# Patient Record
Sex: Female | Born: 1990 | Race: White | Hispanic: No | Marital: Married | Smoking: Former smoker
Health system: Southern US, Community
[De-identification: ages and names within clinical notes are randomized; demographics above are authoritative.]

## PROBLEM LIST (undated history)

## (undated) DIAGNOSIS — O24419 Gestational diabetes mellitus in pregnancy, unspecified control: Secondary | ICD-10-CM

## (undated) DIAGNOSIS — J45909 Unspecified asthma, uncomplicated: Secondary | ICD-10-CM

## (undated) HISTORY — DX: Unspecified asthma, uncomplicated: J45.909

## (undated) HISTORY — PX: BREAST LUMPECTOMY: SHX2

## (undated) HISTORY — PX: OTHER SURGICAL HISTORY: SHX169

---

## 2012-04-09 HISTORY — PX: BREAST LUMPECTOMY: SHX2

## 2018-08-08 ENCOUNTER — Encounter (HOSPITAL_COMMUNITY): Payer: Self-pay | Admitting: *Deleted

## 2018-08-08 ENCOUNTER — Emergency Department (HOSPITAL_COMMUNITY)
Admission: EM | Admit: 2018-08-08 | Discharge: 2018-08-08 | Disposition: A | Discharge status: Home / Self Care | Payer: Worker's Compensation | Attending: Emergency Medicine | Admitting: Emergency Medicine

## 2018-08-08 ENCOUNTER — Other Ambulatory Visit: Payer: Self-pay

## 2018-08-08 DIAGNOSIS — M545 Low back pain, unspecified: Secondary | ICD-10-CM

## 2018-08-08 NOTE — ED Notes (Signed)
Pt states that she does want to file this visit under worker's comp.

## 2018-08-08 NOTE — Discharge Instructions (Addendum)
Off work Friday and Saturday.  Rest, ibuprofen or Tylenol.  Ice followed by heat.

## 2018-08-08 NOTE — ED Provider Notes (Signed)
[facility] EMERGENCY DEPARTMENT Provider Note   CSN: [id number] Arrival date & time: 08/08/18  1927    History   Chief Complaint Chief Complaint  Patient presents with  . Back Pain    HPI Julia Blanchard is a 28 y.o. female.     Left lower back pain after pulling a heavy tub of chicken at work today.  No radicular symptoms.  No bowel or bladder incontinence.  Severity is moderate.  Palpation and positioning make pain worse.     History reviewed. No pertinent past medical history.  There are no active problems to display for this patient.   Past Surgical History:  Procedure Laterality Date  . BREAST LUMPECTOMY     right breast     OB History   No obstetric history on file.      Home Medications    Prior to Admission medications   Not on File    Family History No family history on file.  Social History Social History   Tobacco Use  . Smoking status: Former Research scientist (life sciences)  . Smokeless tobacco: Never Used  Substance Use Topics  . Alcohol use: Never    Frequency: Never  . Drug use: Never     Allergies   Patient has no allergy information on record.   Review of Systems Review of Systems  All other systems reviewed and are negative.    Physical Exam Updated Vital Signs BP 122/64   Pulse 79   Temp 98.3 F (36.8 C) (Oral)   Resp 16   Ht 5\' 1"  (1.549 m)   Wt 91.6 kg   [name] 08/06/2018   SpO2 100%   BMI 38.17 kg/m   Physical Exam Vitals signs and nursing note reviewed.  Constitutional:      Appearance: She is well-developed.  HENT:     Head: Normocephalic and atraumatic.  Eyes:     Conjunctiva/sclera: Conjunctivae normal.  Neck:     Musculoskeletal: Neck supple.  Cardiovascular:     Rate and Rhythm: Normal rate and regular rhythm.  Pulmonary:     Effort: Pulmonary effort is normal.     Breath sounds: Normal breath sounds.  Abdominal:     General: Bowel sounds are normal.     Palpations: Abdomen is soft.  Musculoskeletal:   Comments: Minimal muscular tenderness left lower back  Skin:    General: Skin is warm and dry.  Neurological:     Mental Status: She is alert and oriented to person, place, and time.  Psychiatric:        Behavior: Behavior normal.      ED Treatments / Results  Labs (all labs ordered are listed, but only abnormal results are displayed) Labs Reviewed - No data to display  EKG None  Radiology No results found.  Procedures Procedures (including critical care time)  Medications Ordered in ED Medications - No data to display   Initial Impression / Assessment and Plan / ED Course  I have reviewed the triage vital signs and the nursing notes.  Pertinent labs & imaging results that were available during my care of the patient were reviewed by me and considered in my medical decision making (see chart for details).        History and physical consistent with musculoskeletal strain.  No imaging required.  Final Clinical Impressions(s) / ED Diagnoses   Final diagnoses:  Acute left-sided low back pain without sciatica    ED Discharge Orders    None  Nat Christen, MD 08/08/18 [id number]

## 2018-08-08 NOTE — ED Triage Notes (Signed)
Pt c/o lower back pain that started after pulling a heavy tub at work today, denies any radiation, denies any urination symptoms,

## 2019-01-23 ENCOUNTER — Telehealth: Payer: Self-pay | Admitting: Obstetrics & Gynecology

## 2019-01-23 NOTE — Telephone Encounter (Signed)
We have you scheduled for an upcoming appointment at our office. At this time, we are still not allowing visitors or children during your appointment, however, a support person, over age 27, may accompany you to your appointment if assistance is needed for safety or care concerns. Otherwise, support persons should remain outside until the visit is complete.   We ask if you have had any exposure to anyone suspected or confirmed of having COVID-19 or if you are experiencing any of the following, to call and reschedule your appointment: fever, cough, shortness of breath, muscle pain, diarrhea, rash, vomiting, abdominal pain, red eye, weakness, bruising, bleeding, joint pain, or a severe headache.   Please know we will ask you these questions or similar questions when you arrive for your appointment and again its how we are keeping everyone safe.    Also,to keep you safe, please use the provided hand sanitizer when you enter the office. We are asking everyone in the office to wear a mask to help prevent the spread of germs. If you have a mask of your own, please wear it to your appointment, if not, we are happy to provide one for you.  Thank you for understanding and your cooperation.    [facility] Staff

## 2019-01-26 ENCOUNTER — Other Ambulatory Visit: Payer: Self-pay

## 2019-01-26 ENCOUNTER — Ambulatory Visit (INDEPENDENT_AMBULATORY_CARE_PROVIDER_SITE_OTHER): Payer: Self-pay | Admitting: Women's Health

## 2019-01-26 ENCOUNTER — Encounter: Payer: Self-pay | Admitting: *Deleted

## 2019-01-26 ENCOUNTER — Encounter: Payer: Self-pay | Admitting: Women's Health

## 2019-01-26 VITALS — BP 102/66 | HR 77 | Ht 61.0 in | Wt 208.0 lb

## 2019-01-26 DIAGNOSIS — Z8759 Personal history of other complications of pregnancy, childbirth and the puerperium: Secondary | ICD-10-CM

## 2019-01-26 DIAGNOSIS — Z3491 Encounter for supervision of normal pregnancy, unspecified, first trimester: Secondary | ICD-10-CM

## 2019-01-26 DIAGNOSIS — Z3201 Encounter for pregnancy test, result positive: Secondary | ICD-10-CM

## 2019-01-26 LAB — POCT URINE PREGNANCY: Preg Test, Ur: POSITIVE — AB

## 2019-01-26 NOTE — Patient Instructions (Signed)
Julia Blanchard, I greatly value your feedback.  If you receive a survey following your visit with [place] today, we appreciate you taking the time to fill it out.  Thanks, Knute Neu, CNM, [facility]  [place]!!! It is now [name], [name] Entrance located off of [address] parking   Nausea & Vomiting  Have saltine crackers or pretzels by your bed and eat a few bites before you raise your head out of bed in the morning  Eat small frequent meals throughout the day instead of large meals  Drink plenty of fluids throughout the day to stay hydrated, just don't drink a lot of fluids with your meals.  This can make your stomach fill up faster making you feel sick  Do not brush your teeth right after you eat  Products with real ginger are good for nausea, like ginger ale and ginger hard candy Make sure it says made with real ginger!  Sucking on sour candy like lemon heads is also good for nausea  If your prenatal vitamins make you nauseated, take them at night so you will sleep through the nausea  Sea Bands  If you feel like you need medicine for the nausea & vomiting please let us know  If you are unable to keep any fluids or food down please let us know   Constipation  Drink plenty of fluid, preferably water, throughout the day  Eat foods high in fiber such as fruits, vegetables, and grains  Exercise, such as walking, is a good way to keep your bowels regular  Drink warm fluids, especially warm prune juice, or decaf coffee  Eat a 1/2 cup of real oatmeal (not instant), 1/2 cup applesauce, and 1/2-1 cup warm prune juice every day  If needed, you may take Colace (docusate sodium) stool softener once or twice a day to help keep the stool soft.   If you still are having problems with constipation, you may take Miralax once daily as needed to help keep your bowels regular.   Home  Blood Pressure Monitoring for Patients   Your provider has recommended that you check your blood pressure (BP) at least once a week at home. If you do not have a blood pressure cuff at home, one will be provided for you. Contact your provider if you have not received your monitor within 1 week.   Helpful Tips for Accurate Home Blood Pressure Checks  . Don't smoke, exercise, or drink caffeine 30 minutes before checking your BP . Use the restroom before checking your BP (a full bladder can raise your pressure) . Relax in a comfortable upright chair . Feet on the ground . Left arm resting comfortably on a flat surface at the level of your heart . Legs uncrossed . Back supported . Sit quietly and don't talk . Place the cuff on your bare arm . Adjust snuggly, so that only two fingertips can fit between your skin and the top of the cuff . Check 2 readings separated by at least one minute . Keep a log of your BP readings . For a visual, please reference this diagram: http://ccnc.care/bpdiagram  Provider Name: [facility]     Phone: [phone]  Zone 1: ALL CLEAR  Continue to monitor your symptoms:  . BP reading is less than 140 (top number) or less than 90 (bottom number)  . No right upper stomach pain .  No headaches or seeing spots . No feeling nauseated or throwing up . No swelling in face and hands  Zone 2: CAUTION Call your doctor's office for any of the following:  . BP reading is greater than 140 (top number) or greater than 90 (bottom number)  . Stomach pain under your ribs in the middle or right side . Headaches or seeing spots . Feeling nauseated or throwing up . Swelling in face and hands  Zone 3: EMERGENCY  Seek immediate medical care if you have any of the following:  . BP reading is greater than160 (top number) or greater than 110 (bottom number) . Severe headaches not improving with Tylenol . Serious difficulty catching your breath . Any worsening symptoms  from Zone 2    First Trimester of Pregnancy The first trimester of pregnancy is from week 1 until the end of week 12 (months 1 through 3). A week after a sperm fertilizes an egg, the egg will implant on the wall of the uterus. This embryo will begin to develop into a baby. Genes from you and your partner are forming the baby. The female genes determine whether the baby is a boy or a girl. At 6-8 weeks, the eyes and face are formed, and the heartbeat can be seen on ultrasound. At the end of 12 weeks, all the baby's organs are formed.  Now that you are pregnant, you will want to do everything you can to have a healthy baby. Two of the most important things are to get good prenatal care and to follow your health care provider's instructions. Prenatal care is all the medical care you receive before the baby's birth. This care will help prevent, find, and treat any problems during the pregnancy and childbirth. BODY CHANGES Your body goes through many changes during pregnancy. The changes vary from woman to woman.   You may gain or lose a couple of pounds at first.  You may feel sick to your stomach (nauseous) and throw up (vomit). If the vomiting is uncontrollable, call your health care provider.  You may tire easily.  You may develop headaches that can be relieved by medicines approved by your health care provider.  You may urinate more often. Painful urination may mean you have a bladder infection.  You may develop heartburn as a result of your pregnancy.  You may develop constipation because certain hormones are causing the muscles that push waste through your intestines to slow down.  You may develop hemorrhoids or swollen, bulging veins (varicose veins).  Your breasts may begin to grow larger and become tender. Your nipples may stick out more, and the tissue that surrounds them (areola) may become darker.  Your gums may bleed and may be sensitive to brushing and flossing.  Dark spots or  blotches (chloasma, mask of pregnancy) may develop on your face. This will likely fade after the baby is born.  Your menstrual periods will stop.  You may have a loss of appetite.  You may develop cravings for certain kinds of food.  You may have changes in your emotions from day to day, such as being excited to be pregnant or being concerned that something may go wrong with the pregnancy and baby.  You may have more vivid and strange dreams.  You may have changes in your hair. These can include thickening of your hair, rapid growth, and changes in texture. Some women also have hair loss during or after pregnancy, or hair that feels dry  or thin. Your hair will most likely return to normal after your baby is born. WHAT TO EXPECT AT YOUR PRENATAL VISITS During a routine prenatal visit:  You will be weighed to make sure you and the baby are growing normally.  Your blood pressure will be taken.  Your abdomen will be measured to track your baby's growth.  The fetal heartbeat will be listened to starting around week 10 or 12 of your pregnancy.  Test results from any previous visits will be discussed. Your health care provider may ask you:  How you are feeling.  If you are feeling the baby move.  If you have had any abnormal symptoms, such as leaking fluid, bleeding, severe headaches, or abdominal cramping.  If you have any questions. Other tests that may be performed during your first trimester include:  Blood tests to find your blood type and to check for the presence of any previous infections. They will also be used to check for low iron levels (anemia) and Rh antibodies. Later in the pregnancy, blood tests for diabetes will be done along with other tests if problems develop.  Urine tests to check for infections, diabetes, or protein in the urine.  An ultrasound to confirm the proper growth and development of the baby.  An amniocentesis to check for possible genetic problems.   Fetal screens for spina bifida and Down syndrome.  You may need other tests to make sure you and the baby are doing well. HOME CARE INSTRUCTIONS  Medicines  Follow your health care provider's instructions regarding medicine use. Specific medicines may be either safe or unsafe to take during pregnancy.  Take your prenatal vitamins as directed.  If you develop constipation, try taking a stool softener if your health care provider approves. Diet  Eat regular, well-balanced meals. Choose a variety of foods, such as meat or vegetable-based protein, fish, milk and low-fat dairy products, vegetables, fruits, and whole grain breads and cereals. Your health care provider will help you determine the amount of weight gain that is right for you.  Avoid raw meat and uncooked cheese. These carry germs that can cause birth defects in the baby.  Eating four or five small meals rather than three large meals a day may help relieve nausea and vomiting. If you start to feel nauseous, eating a few soda crackers can be helpful. Drinking liquids between meals instead of during meals also seems to help nausea and vomiting.  If you develop constipation, eat more high-fiber foods, such as fresh vegetables or fruit and whole grains. Drink enough fluids to keep your urine clear or pale yellow. Activity and Exercise  Exercise only as directed by your health care provider. Exercising will help you:  Control your weight.  Stay in shape.  Be prepared for labor and delivery.  Experiencing pain or cramping in the lower abdomen or low back is a good sign that you should stop exercising. Check with your health care provider before continuing normal exercises.  Try to avoid standing for long periods of time. Move your legs often if you must stand in one place for a long time.  Avoid heavy lifting.  Wear low-heeled shoes, and practice good posture.  You may continue to have sex unless your health care provider  directs you otherwise. Relief of Pain or Discomfort  Wear a good support bra for breast tenderness.    Take warm sitz baths to soothe any pain or discomfort caused by hemorrhoids. Use hemorrhoid cream if your  health care provider approves.    Rest with your legs elevated if you have leg cramps or low back pain.  If you develop varicose veins in your legs, wear support hose. Elevate your feet for 15 minutes, 3-4 times a day. Limit salt in your diet. Prenatal Care  Schedule your prenatal visits by the twelfth week of pregnancy. They are usually scheduled monthly at first, then more often in the last 2 months before delivery.  Write down your questions. Take them to your prenatal visits.  Keep all your prenatal visits as directed by your health care provider. Safety  Wear your seat belt at all times when driving.  Make a list of emergency phone numbers, including numbers for family, friends, the hospital, and police and fire departments. General Tips  Ask your health care provider for a referral to a local prenatal education class. Begin classes no later than at the beginning of month 6 of your pregnancy.  Ask for help if you have counseling or nutritional needs during pregnancy. Your health care provider can offer advice or refer you to specialists for help with various needs.  Do not use hot tubs, steam rooms, or saunas.  Do not douche or use tampons or scented sanitary pads.  Do not cross your legs for long periods of time.  Avoid cat litter boxes and soil used by cats. These carry germs that can cause birth defects in the baby and possibly loss of the fetus by miscarriage or stillbirth.  Avoid all smoking, herbs, alcohol, and medicines not prescribed by your health care provider. Chemicals in these affect the formation and growth of the baby.  Schedule a dentist appointment. At home, brush your teeth with a soft toothbrush and be gentle when you floss. SEEK MEDICAL CARE IF:    You have dizziness.  You have mild pelvic cramps, pelvic pressure, or nagging pain in the abdominal area.  You have persistent nausea, vomiting, or diarrhea.  You have a bad smelling vaginal discharge.  You have pain with urination.  You notice increased swelling in your face, hands, legs, or ankles. SEEK IMMEDIATE MEDICAL CARE IF:   You have a fever.  You are leaking fluid from your vagina.  You have spotting or bleeding from your vagina.  You have severe abdominal cramping or pain.  You have rapid weight gain or loss.  You vomit blood or material that looks like coffee grounds.  You are exposed to [place] measles and have never had them.  You are exposed to fifth disease or chickenpox.  You develop a severe headache.  You have shortness of breath.  You have any kind of trauma, such as from a fall or a car accident. Document Released: 03/20/2001 Document Revised: 08/10/2013 Document Reviewed: 02/03/2013 [facility] Patient Information 2015 [place], [place]. This information is not intended to replace advice given to you by your health care provider. Make sure you discuss any questions you have with your health care provider.  Coronavirus (COVID-19) Are you at risk?  Are you at risk for the Coronavirus (COVID-19)?  To be considered HIGH RISK for Coronavirus (COVID-19), you have to meet the following criteria:  . Traveled to [place], [place], [place], [place] or [place]; or in the [place] to [place], [place], [place], or [place]; and have fever, cough, and shortness of breath within the last 2 weeks of travel OR . Been in close contact with a person diagnosed with COVID-19 within the last 2 weeks and  have fever, cough, and shortness of breath . IF YOU DO NOT MEET THESE CRITERIA, YOU ARE CONSIDERED LOW RISK FOR COVID-19.  What to do if you are HIGH RISK for COVID-19?  Marland Kitchen If you are having a medical emergency, call 911. . Seek medical care right away. Before  you go to a doctor's office, urgent care or emergency department, call ahead and tell them about your recent travel, contact with someone diagnosed with COVID-19, and your symptoms. You should receive instructions from your physician's office regarding next steps of care.  . When you arrive at healthcare provider, tell the healthcare staff immediately you have returned from visiting [place], [place], [place], [place] or [place]; or traveled in the [place] to [place], [place], [place], or [place]; in the last two weeks or you have been in close contact with a person diagnosed with COVID-19 in the last 2 weeks.   . Tell the health care staff about your symptoms: fever, cough and shortness of breath. . After you have been seen by a medical provider, you will be either: o Tested for (COVID-19) and discharged home on quarantine except to seek medical care if symptoms worsen, and asked to  - Stay home and avoid contact with others until you get your results (4-5 days)  - Avoid travel on public transportation if possible (such as bus, train, or airplane) or o Sent to the Emergency Department by EMS for evaluation, COVID-19 testing, and possible admission depending on your condition and test results.  What to do if you are LOW RISK for COVID-19?  Reduce your risk of any infection by using the same precautions used for avoiding the common cold or flu:  Marland Kitchen Wash your hands often with soap and warm water for at least 20 seconds.  If soap and water are not readily available, use an alcohol-based hand sanitizer with at least 60% alcohol.  . If coughing or sneezing, cover your mouth and nose by coughing or sneezing into the elbow areas of your shirt or coat, into a tissue or into your sleeve (not your hands). . Avoid shaking hands with others and consider head nods or verbal greetings only. . Avoid touching your eyes, nose, or mouth with unwashed hands.  . Avoid close contact with people who are sick. .  Avoid places or events with large numbers of people in one location, like concerts or sporting events. . Carefully consider travel plans you have or are making. . If you are planning any travel outside or inside the [place], visit the CDC's Travelers' Health webpage for the latest health notices. . If you have some symptoms but not all symptoms, continue to monitor at home and seek medical attention if your symptoms worsen. . If you are having a medical emergency, call 911.   [place] / e-Visit: [email or web address]         [facility]: [facility]: [id number]                   [facility]: [phone]

## 2019-01-26 NOTE — Progress Notes (Signed)
   GYN VISIT Patient name: Julia Blanchard MRN [id number]  Date of birth: 1990-04-22 Chief Complaint:   Possible Pregnancy  History of Present Illness:   Julia Blanchard is a 28 y.o. G25P0030 Caucasian female at [redacted]w[redacted]d by certain [name], being seen today for +HPT.+nausea, no vomiting, declines meds. Breasts sore. Taking pnv. No chronic problems, no meds, doesn't smoke/drink etoh/do illicit drugs. H/O SAB x 3.      Patient's last menstrual period was 12/19/2018 (exact date). The current method of family planning is none.  Review of Systems:   Pertinent items are noted in HPI Denies fever/chills, dizziness, headaches, visual disturbances, fatigue, shortness of breath, chest pain, abdominal pain, vomiting, abnormal vaginal discharge/itching/odor/irritation, problems with periods, bowel movements, urination, or intercourse unless otherwise stated above.  Pertinent History Reviewed:  Reviewed past medical,surgical, social, obstetrical and family history.  Reviewed problem list, medications and allergies. Physical Assessment:   Vitals:   01/26/19 1428  BP: 102/66  Pulse: 77  Weight: 208 lb (94.3 kg)  Height: 5\' 1"  (1.549 m)  Body mass index is 39.3 kg/m.       Physical Examination:   General appearance: alert, well appearing, and in no distress  Mental status: alert, oriented to person, place, and time  Skin: warm & dry   Cardiovascular: normal heart rate noted  Respiratory: normal respiratory effort, no distress  Abdomen: soft, non-tender   Pelvic: examination not indicated  Extremities: no edema   Results for orders placed or performed in visit on 01/26/19 (from the past 24 hour(s))  POCT urine pregnancy   Collection Time: 01/26/19  2:25 PM  Result Value Ref Range   Preg Test, Ur Positive (A) Negative    Assessment & Plan:  1) [redacted]w[redacted]d by [name]> will get dating u/s, continue pnv  2) H/O miscarriage x 3> will check progesterone  Meds: No orders of the defined types were placed in  this encounter.   Orders Placed This Encounter  Procedures  . US OB Comp Less 14 Wks  . Progesterone    Return in about 3 weeks (around 02/16/2019) for dating u/s.  [place], [facility] 01/26/2019 3:10 PM

## 2019-01-27 ENCOUNTER — Other Ambulatory Visit: Payer: Self-pay | Admitting: Women's Health

## 2019-01-27 LAB — PROGESTERONE: Progesterone: 10.7 ng/mL

## 2019-01-27 MED ORDER — PROGESTERONE MICRONIZED 200 MG PO CAPS: 200.0000 mg | ORAL_CAPSULE | Freq: Every day | ORAL | 5 refills | Status: DC

## 2019-02-16 ENCOUNTER — Other Ambulatory Visit: Payer: Self-pay

## 2019-02-16 ENCOUNTER — Ambulatory Visit (INDEPENDENT_AMBULATORY_CARE_PROVIDER_SITE_OTHER): Payer: Medicaid Other

## 2019-02-16 DIAGNOSIS — Z3491 Encounter for supervision of normal pregnancy, unspecified, first trimester: Secondary | ICD-10-CM

## 2019-02-16 DIAGNOSIS — Z3A01 Less than 8 weeks gestation of pregnancy: Secondary | ICD-10-CM | POA: Diagnosis not present

## 2019-02-16 NOTE — Progress Notes (Signed)
[place] 8+3 wks,single IUP w/ys,positive fht 173 bpm,normal ovaries bilat,crl 19.88 mm

## 2019-03-04 ENCOUNTER — Other Ambulatory Visit: Payer: Self-pay | Admitting: Adult Health

## 2019-03-04 MED ORDER — PROMETHAZINE HCL 25 MG PO TABS: 25.0000 mg | ORAL_TABLET | Freq: Four times a day (QID) | ORAL | 1 refills | Status: DC | PRN

## 2019-03-04 NOTE — Progress Notes (Signed)
rx phenergan for nausea

## 2019-03-16 ENCOUNTER — Other Ambulatory Visit: Payer: Self-pay | Admitting: Obstetrics & Gynecology

## 2019-03-16 DIAGNOSIS — Z3682 Encounter for antenatal screening for nuchal translucency: Secondary | ICD-10-CM

## 2019-03-17 ENCOUNTER — Encounter: Payer: Self-pay | Admitting: Women's Health

## 2019-03-17 ENCOUNTER — Ambulatory Visit (INDEPENDENT_AMBULATORY_CARE_PROVIDER_SITE_OTHER): Payer: Medicaid Other | Admitting: Women's Health

## 2019-03-17 ENCOUNTER — Other Ambulatory Visit (HOSPITAL_COMMUNITY)
Admission: RE | Admit: 2019-03-17 | Discharge: 2019-03-17 | Disposition: A | Discharge status: Home / Self Care | Payer: Medicaid Other | Source: Ambulatory Visit | Attending: Obstetrics & Gynecology | Admitting: Obstetrics & Gynecology

## 2019-03-17 ENCOUNTER — Ambulatory Visit (INDEPENDENT_AMBULATORY_CARE_PROVIDER_SITE_OTHER): Payer: Medicaid Other

## 2019-03-17 ENCOUNTER — Other Ambulatory Visit: Payer: Self-pay

## 2019-03-17 VITALS — BP 118/80 | HR 74 | Wt 214.0 lb

## 2019-03-17 DIAGNOSIS — Z3A12 12 weeks gestation of pregnancy: Secondary | ICD-10-CM

## 2019-03-17 DIAGNOSIS — Z124 Encounter for screening for malignant neoplasm of cervix: Secondary | ICD-10-CM

## 2019-03-17 DIAGNOSIS — Z1389 Encounter for screening for other disorder: Secondary | ICD-10-CM

## 2019-03-17 DIAGNOSIS — Z3481 Encounter for supervision of other normal pregnancy, first trimester: Secondary | ICD-10-CM | POA: Diagnosis not present

## 2019-03-17 DIAGNOSIS — Z0283 Encounter for blood-alcohol and blood-drug test: Secondary | ICD-10-CM | POA: Diagnosis not present

## 2019-03-17 DIAGNOSIS — O0990 Supervision of high risk pregnancy, unspecified, unspecified trimester: Secondary | ICD-10-CM | POA: Insufficient documentation

## 2019-03-17 DIAGNOSIS — Z331 Pregnant state, incidental: Secondary | ICD-10-CM

## 2019-03-17 DIAGNOSIS — Z3682 Encounter for antenatal screening for nuchal translucency: Secondary | ICD-10-CM | POA: Diagnosis not present

## 2019-03-17 DIAGNOSIS — Z363 Encounter for antenatal screening for malformations: Secondary | ICD-10-CM

## 2019-03-17 DIAGNOSIS — Z3402 Encounter for supervision of normal first pregnancy, second trimester: Secondary | ICD-10-CM

## 2019-03-17 DIAGNOSIS — Z3490 Encounter for supervision of normal pregnancy, unspecified, unspecified trimester: Secondary | ICD-10-CM | POA: Insufficient documentation

## 2019-03-17 DIAGNOSIS — Z1379 Encounter for other screening for genetic and chromosomal anomalies: Secondary | ICD-10-CM

## 2019-03-17 DIAGNOSIS — Z130 Encounter for screening for diseases of the blood and blood-forming organs and certain disorders involving the immune mechanism: Secondary | ICD-10-CM

## 2019-03-17 MED ORDER — BLOOD PRESSURE MONITOR MISC: 0 refills | Status: DC

## 2019-03-17 NOTE — Progress Notes (Signed)
[place] 12+4 wks,measurements c/w dates,crl 72.13 mm,NB present,NT 1.9 mm,normal ovaries,anterior placenta,fhr 161 bpm

## 2019-03-17 NOTE — Patient Instructions (Addendum)
Kegan Daignault, I greatly value your feedback.  If you receive a survey following your visit with [place] today, we appreciate you taking the time to fill it out.  Thanks, Knute Neu, CNM, [facility]  [place]!!! It is now [facility] ([place], [place] [address]) Entrance located off of [address] parking   You can stop the prometrium on 12/18  Nausea & Vomiting  Have saltine crackers or pretzels by your bed and eat a few bites before you raise your head out of bed in the morning  Eat small frequent meals throughout the day instead of large meals  Drink plenty of fluids throughout the day to stay hydrated, just don't drink a lot of fluids with your meals.  This can make your stomach fill up faster making you feel sick  Do not brush your teeth right after you eat  Products with real ginger are good for nausea, like ginger ale and ginger hard candy Make sure it says made with real ginger!  Sucking on sour candy like lemon heads is also good for nausea  If your prenatal vitamins make you nauseated, take them at night so you will sleep through the nausea  Sea Bands  If you feel like you need medicine for the nausea & vomiting please let us know  If you are unable to keep any fluids or food down please let us know   Constipation  Drink plenty of fluid, preferably water, throughout the day  Eat foods high in fiber such as fruits, vegetables, and grains  Exercise, such as walking, is a good way to keep your bowels regular  Drink warm fluids, especially warm prune juice, or decaf coffee  Eat a 1/2 cup of real oatmeal (not instant), 1/2 cup applesauce, and 1/2-1 cup warm prune juice every day  If needed, you may take Colace (docusate sodium) stool softener once or twice a day to help keep the stool soft.   If you still are having problems with constipation, you may take Miralax once daily as needed to help  keep your bowels regular.   Home Blood Pressure Monitoring for Patients   Your provider has recommended that you check your blood pressure (BP) at least once a week at home. If you do not have a blood pressure cuff at home, one will be provided for you. Contact your provider if you have not received your monitor within 1 week.   Helpful Tips for Accurate Home Blood Pressure Checks   Don't smoke, exercise, or drink caffeine 30 minutes before checking your BP  Use the restroom before checking your BP (a full bladder can raise your pressure)  Relax in a comfortable upright chair  Feet on the ground  Left arm resting comfortably on a flat surface at the level of your heart  Legs uncrossed  Back supported  Sit quietly and don't talk  Place the cuff on your bare arm  Adjust snuggly, so that only two fingertips can fit between your skin and the top of the cuff  Check 2 readings separated by at least one minute  Keep a log of your BP readings  For a visual, please reference this diagram: http://ccnc.care/bpdiagram  Provider Name: [facility]     Phone: [phone]  Zone 1: ALL CLEAR  Continue to monitor your symptoms:   BP reading is less than 140 (top number) or less than 90 (bottom  number)   No right upper stomach pain  No headaches or seeing spots  No feeling nauseated or throwing up  No swelling in face and hands  Zone 2: CAUTION Call your doctor's office for any of the following:   BP reading is greater than 140 (top number) or greater than 90 (bottom number)   Stomach pain under your ribs in the middle or right side  Headaches or seeing spots  Feeling nauseated or throwing up  Swelling in face and hands  Zone 3: EMERGENCY  Seek immediate medical care if you have any of the following:   BP reading is greater than160 (top number) or greater than 110 (bottom number)  Severe headaches not improving with Tylenol  Serious difficulty catching your  breath  Any worsening symptoms from Zone 2    First Trimester of Pregnancy The first trimester of pregnancy is from week 1 until the end of week 12 (months 1 through 3). A week after a sperm fertilizes an egg, the egg will implant on the wall of the uterus. This embryo will begin to develop into a baby. Genes from you and your partner are forming the baby. The female genes determine whether the baby is a boy or a girl. At 6-8 weeks, the eyes and face are formed, and the heartbeat can be seen on ultrasound. At the end of 12 weeks, all the baby's organs are formed.  Now that you are pregnant, you will want to do everything you can to have a healthy baby. Two of the most important things are to get good prenatal care and to follow your health care provider's instructions. Prenatal care is all the medical care you receive before the baby's birth. This care will help prevent, find, and treat any problems during the pregnancy and childbirth. BODY CHANGES Your body goes through many changes during pregnancy. The changes vary from woman to woman.   You may gain or lose a couple of pounds at first.  You may feel sick to your stomach (nauseous) and throw up (vomit). If the vomiting is uncontrollable, call your health care provider.  You may tire easily.  You may develop headaches that can be relieved by medicines approved by your health care provider.  You may urinate more often. Painful urination may mean you have a bladder infection.  You may develop heartburn as a result of your pregnancy.  You may develop constipation because certain hormones are causing the muscles that push waste through your intestines to slow down.  You may develop hemorrhoids or swollen, bulging veins (varicose veins).  Your breasts may begin to grow larger and become tender. Your nipples may stick out more, and the tissue that surrounds them (areola) may become darker.  Your gums may bleed and may be sensitive to brushing  and flossing.  Dark spots or blotches (chloasma, mask of pregnancy) may develop on your face. This will likely fade after the baby is born.  Your menstrual periods will stop.  You may have a loss of appetite.  You may develop cravings for certain kinds of food.  You may have changes in your emotions from day to day, such as being excited to be pregnant or being concerned that something may go wrong with the pregnancy and baby.  You may have more vivid and strange dreams.  You may have changes in your hair. These can include thickening of your hair, rapid growth, and changes in texture. Some women also have hair loss  during or after pregnancy, or hair that feels dry or thin. Your hair will most likely return to normal after your baby is born. WHAT TO EXPECT AT YOUR PRENATAL VISITS During a routine prenatal visit:  You will be weighed to make sure you and the baby are growing normally.  Your blood pressure will be taken.  Your abdomen will be measured to track your baby's growth.  The fetal heartbeat will be listened to starting around week 10 or 12 of your pregnancy.  Test results from any previous visits will be discussed. Your health care provider may ask you:  How you are feeling.  If you are feeling the baby move.  If you have had any abnormal symptoms, such as leaking fluid, bleeding, severe headaches, or abdominal cramping.  If you have any questions. Other tests that may be performed during your first trimester include:  Blood tests to find your blood type and to check for the presence of any previous infections. They will also be used to check for low iron levels (anemia) and Rh antibodies. Later in the pregnancy, blood tests for diabetes will be done along with other tests if problems develop.  Urine tests to check for infections, diabetes, or protein in the urine.  An ultrasound to confirm the proper growth and development of the baby.  An amniocentesis to check  for possible genetic problems.  Fetal screens for spina bifida and Down syndrome.  You may need other tests to make sure you and the baby are doing well. HOME CARE INSTRUCTIONS  Medicines  Follow your health care provider's instructions regarding medicine use. Specific medicines may be either safe or unsafe to take during pregnancy.  Take your prenatal vitamins as directed.  If you develop constipation, try taking a stool softener if your health care provider approves. Diet  Eat regular, well-balanced meals. Choose a variety of foods, such as meat or vegetable-based protein, fish, milk and low-fat dairy products, vegetables, fruits, and whole grain breads and cereals. Your health care provider will help you determine the amount of weight gain that is right for you.  Avoid raw meat and uncooked cheese. These carry germs that can cause birth defects in the baby.  Eating four or five small meals rather than three large meals a day may help relieve nausea and vomiting. If you start to feel nauseous, eating a few soda crackers can be helpful. Drinking liquids between meals instead of during meals also seems to help nausea and vomiting.  If you develop constipation, eat more high-fiber foods, such as fresh vegetables or fruit and whole grains. Drink enough fluids to keep your urine clear or pale yellow. Activity and Exercise  Exercise only as directed by your health care provider. Exercising will help you:  Control your weight.  Stay in shape.  Be prepared for labor and delivery.  Experiencing pain or cramping in the lower abdomen or low back is a good sign that you should stop exercising. Check with your health care provider before continuing normal exercises.  Try to avoid standing for long periods of time. Move your legs often if you must stand in one place for a long time.  Avoid heavy lifting.  Wear low-heeled shoes, and practice good posture.  You may continue to have sex  unless your health care provider directs you otherwise. Relief of Pain or Discomfort  Wear a good support bra for breast tenderness.    Take warm sitz baths to soothe any pain or  discomfort caused by hemorrhoids. Use hemorrhoid cream if your health care provider approves.    Rest with your legs elevated if you have leg cramps or low back pain.  If you develop varicose veins in your legs, wear support hose. Elevate your feet for 15 minutes, 3-4 times a day. Limit salt in your diet. Prenatal Care  Schedule your prenatal visits by the twelfth week of pregnancy. They are usually scheduled monthly at first, then more often in the last 2 months before delivery.  Write down your questions. Take them to your prenatal visits.  Keep all your prenatal visits as directed by your health care provider. Safety  Wear your seat belt at all times when driving.  Make a list of emergency phone numbers, including numbers for family, friends, the hospital, and police and fire departments. General Tips  Ask your health care provider for a referral to a local prenatal education class. Begin classes no later than at the beginning of month 6 of your pregnancy.  Ask for help if you have counseling or nutritional needs during pregnancy. Your health care provider can offer advice or refer you to specialists for help with various needs.  Do not use hot tubs, steam rooms, or saunas.  Do not douche or use tampons or scented sanitary pads.  Do not cross your legs for long periods of time.  Avoid cat litter boxes and soil used by cats. These carry germs that can cause birth defects in the baby and possibly loss of the fetus by miscarriage or stillbirth.  Avoid all smoking, herbs, alcohol, and medicines not prescribed by your health care provider. Chemicals in these affect the formation and growth of the baby.  Schedule a dentist appointment. At home, brush your teeth with a soft toothbrush and be gentle when you  floss. SEEK MEDICAL CARE IF:   You have dizziness.  You have mild pelvic cramps, pelvic pressure, or nagging pain in the abdominal area.  You have persistent nausea, vomiting, or diarrhea.  You have a bad smelling vaginal discharge.  You have pain with urination.  You notice increased swelling in your face, hands, legs, or ankles. SEEK IMMEDIATE MEDICAL CARE IF:   You have a fever.  You are leaking fluid from your vagina.  You have spotting or bleeding from your vagina.  You have severe abdominal cramping or pain.  You have rapid weight gain or loss.  You vomit blood or material that looks like coffee grounds.  You are exposed to [place] measles and have never had them.  You are exposed to fifth disease or chickenpox.  You develop a severe headache.  You have shortness of breath.  You have any kind of trauma, such as from a fall or a car accident. Document Released: 03/20/2001 Document Revised: 08/10/2013 Document Reviewed: 02/03/2013 [facility] Patient Information 2015 [place], [place]. This information is not intended to replace advice given to you by your health care provider. Make sure you discuss any questions you have with your health care provider.  Coronavirus (COVID-19) Are you at risk?  Are you at risk for the Coronavirus (COVID-19)?  To be considered HIGH RISK for Coronavirus (COVID-19), you have to meet the following criteria:   Traveled to [place], [place], [place], [place] or [place]; or in the [place] to [place], [place], [place], or [place]; and have fever, cough, and shortness of breath within the last 2 weeks of travel [place] in close contact with a person  diagnosed with COVID-19 within the last 2 weeks and have fever, cough, and shortness of breath  IF YOU DO NOT MEET THESE CRITERIA, YOU ARE CONSIDERED LOW RISK FOR COVID-19.  What to do if you are HIGH RISK for COVID-19?   If you are having a medical emergency, call 911.  Seek  medical care right away. Before you go to a doctors office, urgent care or emergency department, call ahead and tell them about your recent travel, contact with someone diagnosed with COVID-19, and your symptoms. You should receive instructions from your physicians office regarding next steps of care.   When you arrive at healthcare provider, tell the healthcare staff immediately you have returned from visiting [place], [place], [place], [place] or [place]; or traveled in the [place] to [place], [place], [place], or [place]; in the last two weeks or you have been in close contact with a person diagnosed with COVID-19 in the last 2 weeks.    Tell the health care staff about your symptoms: fever, cough and shortness of breath.  After you have been seen by a medical provider, you will be either: o Tested for (COVID-19) and discharged home on quarantine except to seek medical care if symptoms worsen, and asked to  - Stay home and avoid contact with others until you get your results (4-5 days)  - Avoid travel on public transportation if possible (such as bus, train, or airplane) or o Sent to the Emergency Department by EMS for evaluation, COVID-19 testing, and possible admission depending on your condition and test results.  What to do if you are LOW RISK for COVID-19?  Reduce your risk of any infection by using the same precautions used for avoiding the common cold or flu:   Wash your hands often with soap and warm water for at least 20 seconds.  If soap and water are not readily available, use an alcohol-based hand sanitizer with at least 60% alcohol.   If coughing or sneezing, cover your mouth and nose by coughing or sneezing into the elbow areas of your shirt or coat, into a tissue or into your sleeve (not your hands).  Avoid shaking hands with others and consider head nods or verbal greetings only.  Avoid touching your eyes, nose, or mouth with unwashed hands.   Avoid close  contact with people who are sick.  Avoid places or events with large numbers of people in one location, like concerts or sporting events.  Carefully consider travel plans you have or are making.  If you are planning any travel outside or inside the [place], visit the [facility] webpage for the latest health notices.  If you have some symptoms but not all symptoms, continue to monitor at home and seek medical attention if your symptoms worsen.  If you are having a medical emergency, call 911.   [place] / e-Visit: [email or web address]         [facility]: [phone]  [facility]: [id number]                   [facility]: [id number]     PROTECT YOURSELF & YOUR BABY FROM THE FLU! Because you are pregnant, we at [facility], along with the Centers for Disease Control (CDC), recommend that you receive the flu vaccine to protect yourself and your baby from the flu. The flu is more likely to cause severe illness  in pregnant women than in women of reproductive age who are not pregnant. Changes in the immune system, heart, and lungs during pregnancy make pregnant women (and women up to two weeks postpartum) more prone to severe illness from flu, including illness resulting in hospitalization. Flu also may be harmful for a pregnant womans developing baby. A common flu symptom is fever, which may be associated with neural tube defects and other adverse outcomes for a developing baby. Getting vaccinated can also help protect a baby after birth from flu. (Mom passes antibodies onto the developing baby during her pregnancy.)  A Flu Vaccine is the Best Protection Against Flu Getting a flu vaccine is the first and most important step in protecting against flu. Pregnant women should get a flu shot and not the live attenuated influenza vaccine (LAIV), also  known as nasal spray flu vaccine. Flu vaccines given during pregnancy help protect both the mother and her baby from flu. Vaccination has been shown to reduce the risk of flu-associated acute respiratory infection in pregnant women by up to one-half. A 2018 study showed that getting a flu shot reduced a pregnant womans risk of being hospitalized with flu by an average of 40 percent. Pregnant women who get a flu vaccine are also helping to protect their babies from flu illness for the first several months after their birth, when they are too young to get vaccinated.   A Long Record of Safety for Flu Shots in Pregnant Women Flu shots have been given to millions of pregnant women over many years with a good safety record. There is a lot of evidence that flu vaccines can be given safely during pregnancy; though these data are limited for the first trimester. The CDC recommends that pregnant women get vaccinated during any trimester of their pregnancy. It is very important for pregnant women to get the flu shot.   Other Preventive Actions In addition to getting a flu shot, pregnant women should take the same everyday preventive actions the CDC recommends of everyone, including covering coughs, washing hands often, and avoiding people who are sick.  Symptoms and Treatment If you get sick with flu symptoms call your doctor right away. There are antiviral drugs that can treat flu illness and prevent serious flu complications. The CDC recommends prompt treatment for people who have influenza infection or suspected influenza infection and who are at high risk of serious flu complications, such as people with asthma, diabetes (including gestational diabetes), or heart disease. Early treatment of influenza in hospitalized pregnant women has been shown to reduce the length of the hospital stay.  Symptoms Flu symptoms include fever, cough, sore throat, runny or stuffy nose, body aches, headache, chills and fatigue.  Some people may also have vomiting and diarrhea. People may be infected with the flu and have respiratory symptoms without a fever.  Early Treatment is Important for Pregnant Women Treatment should begin as soon as possible because antiviral drugs work best when started early (within 48 hours after symptoms start). Antiviral drugs can make your flu illness milder and make you feel better faster. They may also prevent serious health problems that can result from flu illness. Oral oseltamivir (Tamiflu) is the preferred treatment for pregnant women because it has the most studies available to suggest that it is safe and beneficial. Antiviral drugs require a prescription from your provider. Having a fever caused by flu infection or other infections early in pregnancy may be linked to birth defects in a baby. In  addition to taking antiviral drugs, pregnant women who get a fever should treat their fever with Tylenol (acetaminophen) and contact their provider immediately.  When to [place] If you are pregnant and have any of these signs, seek care immediately:  Difficulty breathing or shortness of breath  Pain or pressure in the chest or abdomen  Sudden dizziness  Confusion  Severe or persistent vomiting  High fever that is not responding to Tylenol (or store brand equivalent)  Decreased or no movement of your baby  [email or web address]

## 2019-03-17 NOTE — Progress Notes (Signed)
INITIAL OBSTETRICAL VISIT Patient name: Julia Blanchard MRN [id number]  Date of birth: 07-03-1990 Chief Complaint:   Initial Prenatal Visit  History of Present Illness:   Julia Blanchard is a 28 y.o. G29P0030 Caucasian female at [redacted]w[redacted]d by [name] c/w 8wk u/s, with an Estimated Date of Delivery: 09/25/19 being seen today for her initial obstetrical visit.   Her obstetrical history is significant for SAB x 3.  Low progesterone earlier this pregnancy, on prometrium qhs Today she reports no complaints.  Patient's last menstrual period was 12/19/2018 (exact date). Last pap 68yrs ago. Results were: normal Review of Systems:   Pertinent items are noted in HPI Denies cramping/contractions, leakage of fluid, vaginal bleeding, abnormal vaginal discharge w/ itching/odor/irritation, headaches, visual changes, shortness of breath, chest pain, abdominal pain, severe nausea/vomiting, or problems with urination or bowel movements unless otherwise stated above.  Pertinent History Reviewed:  Reviewed past medical,surgical, social, obstetrical and family history.  Reviewed problem list, medications and allergies. OB History  Gravida Para Term Preterm AB Living  4 0 0 0 3 0  SAB TAB Ectopic Multiple Live Births  3       0    # Outcome Date GA Lbr Len/2nd Weight Sex Delivery Anes PTL Lv  4 Current           3 SAB           2 SAB           1 SAB            Physical Assessment:   Vitals:   03/17/19 1403  BP: 118/80  Pulse: 74  Weight: 214 lb (97.1 kg)  Body mass index is 40.43 kg/m.       Physical Examination:  General appearance - well appearing, and in no distress  Mental status - alert, oriented to person, place, and time  Psych:  She has a normal mood and affect  Skin - warm and dry, normal color, no suspicious lesions noted  Chest - effort normal, all lung fields clear to auscultation bilaterally  Heart - normal rate and regular rhythm  Abdomen - soft, nontender  Extremities:  No swelling  or varicosities noted. Patch of scattered small hemangiomas Lt shoulder/upper arm, co-exam w/ JVF who agrees they are hemangiomas  Pelvic - VULVA: normal appearing vulva with no masses, tenderness or lesions  VAGINA: normal appearing vagina with normal color and discharge, no lesions  CERVIX: normal appearing cervix without discharge or lesions, no CMT. Buttocks: hemangioma noted, states she has some red dots on shoulder/arm too x 2.10yrs  Thin prep pap is done w/ HR HPV cotesting  TODAY'S NT [place] 12+4 wks,measurements c/w dates,crl 72.13 mm,NB present,NT 1.9 mm,normal ovaries,anterior placenta,fhr 161 bpm  No results found for this or any previous visit (from the past 24 hour(s)).  Assessment & Plan:  1) Low-Risk Pregnancy G4P0030 at [redacted]w[redacted]d with an Estimated Date of Delivery: 09/25/19   2) Initial OB visit  3) Prev SAB x 3> w/ low progesterone earlier this pregnancy, continue prometrium qhs until 14wks (12/18)  4) Hemangiomas> Lt buttocks, shoulder & upper arm  Meds:  Meds ordered this encounter  Medications  . Blood Pressure Monitor MISC    Sig: For regular home bp monitoring during pregnancy    Dispense:  1 each    Refill:  0    Z34.90    Initial labs obtained Continue prenatal vitamins Reviewed n/v relief measures and warning s/s to report Reviewed recommended weight  gain based on pre-gravid BMI Encouraged well-balanced diet Genetic Screening discussed: requested nt/it, maternit21 Cystic fibrosis, SMA, Fragile X screening discussed requested Ultrasound discussed; fetal survey: requested CCNC completed>PCM not here, form faxed The nature of [place] for [employer] with multiple MDs and other Advanced Practice Providers was explained to patient; also emphasized that fellows, residents, and students are part of our team. Does not have home bp cuff. Rx faxed to [facility]. Check bp weekly, let us know if >140/90.   Follow-up: Return in about 6 weeks (around 04/28/2019) for  LROB, 2nd IT, [id number], in person, CNM.   Orders Placed This Encounter  Procedures  . Urine Culture  . US OB Comp + 14 Wk  . Urinalysis, Routine w reflex microscopic  . Pain Management Screening Profile (10S)  . Obstetric Panel, Including HIV  . Sickle Cell Scr  . Integrated 1  . Inheritest Core(CF97,SMA,FraX)  . MaterniT21 PLUS Core  . POC Urinalysis Dipstick OB    Roma Schanz CNM, [facility] 03/17/2019 2:52 PM

## 2019-03-18 ENCOUNTER — Encounter: Payer: Self-pay | Admitting: Women's Health

## 2019-03-18 DIAGNOSIS — Z6791 Unspecified blood type, Rh negative: Secondary | ICD-10-CM | POA: Insufficient documentation

## 2019-03-18 LAB — PMP SCREEN PROFILE (10S), URINE
Amphetamine Scrn, Ur: NEGATIVE ng/mL
BARBITURATE SCREEN URINE: NEGATIVE ng/mL
BENZODIAZEPINE SCREEN, URINE: NEGATIVE ng/mL
CANNABINOIDS UR QL SCN: NEGATIVE ng/mL
Cocaine (Metab) Scrn, Ur: NEGATIVE ng/mL
Creatinine(Crt), U: 50.6 mg/dL (ref 20.0–300.0)
Methadone Screen, Urine: NEGATIVE ng/mL
OXYCODONE+OXYMORPHONE UR QL SCN: NEGATIVE ng/mL
Opiate Scrn, Ur: NEGATIVE ng/mL
Ph of Urine: 5.6 (ref 4.5–8.9)
Phencyclidine Qn, Ur: NEGATIVE ng/mL
Propoxyphene Scrn, Ur: NEGATIVE ng/mL

## 2019-03-18 LAB — URINALYSIS, ROUTINE W REFLEX MICROSCOPIC
Bilirubin, UA: NEGATIVE
Glucose, UA: NEGATIVE
Ketones, UA: NEGATIVE
Leukocytes,UA: NEGATIVE
Nitrite, UA: NEGATIVE
Protein,UA: NEGATIVE
RBC, UA: NEGATIVE
Specific Gravity, UA: 1.012 (ref 1.005–1.030)
Urobilinogen, Ur: 0.2 mg/dL (ref 0.2–1.0)
pH, UA: 5.5 (ref 5.0–7.5)

## 2019-03-18 LAB — SICKLE CELL SCREEN: Sickle Cell Screen: NEGATIVE

## 2019-03-19 LAB — OBSTETRIC PANEL, INCLUDING HIV
Antibody Screen: NEGATIVE
Basophils Absolute: 0.1 10*3/uL (ref 0.0–0.2)
Basos: 1 %
EOS (ABSOLUTE): 0.2 10*3/uL (ref 0.0–0.4)
Eos: 2 %
HIV Screen 4th Generation wRfx: NONREACTIVE
Hematocrit: 38.5 % (ref 34.0–46.6)
Hemoglobin: 13.2 g/dL (ref 11.1–15.9)
Hepatitis B Surface Ag: NEGATIVE
Immature Grans (Abs): 0 10*3/uL (ref 0.0–0.1)
Immature Granulocytes: 0 %
Lymphocytes Absolute: 2 10*3/uL (ref 0.7–3.1)
Lymphs: 22 %
MCH: 30.6 pg (ref 26.6–33.0)
MCHC: 34.3 g/dL (ref 31.5–35.7)
MCV: 89 fL (ref 79–97)
Monocytes Absolute: 0.5 10*3/uL (ref 0.1–0.9)
Monocytes: 5 %
Neutrophils Absolute: 6.5 10*3/uL (ref 1.4–7.0)
Neutrophils: 70 %
Platelets: 242 10*3/uL (ref 150–450)
RBC: 4.31 x10E6/uL (ref 3.77–5.28)
RDW: 11.7 % (ref 11.7–15.4)
RPR Ser Ql: NONREACTIVE
Rh Factor: NEGATIVE
Rubella Antibodies, IGG: 2.03 index (ref >0.99)
WBC: 9.3 10*3/uL (ref 3.4–10.8)

## 2019-03-19 LAB — INTEGRATED 1
Crown Rump Length: 72.1 mm
Gest. Age on Collection Date: 13.1 weeks
Maternal Age at EDD: 27.8 yr
Nuchal Translucency (NT): 1.9 mm
Number of Fetuses: 1
PAPP-A Value: 784.3 ng/mL
Weight: 214 [lb_av]

## 2019-03-19 LAB — CYTOLOGY - PAP
Chlamydia: NEGATIVE
Comment: NEGATIVE
Comment: NORMAL
Diagnosis: NEGATIVE
Neisseria Gonorrhea: NEGATIVE

## 2019-03-19 LAB — URINE CULTURE: Organism ID, Bacteria: NO GROWTH

## 2019-03-25 LAB — MATERNIT 21 PLUS CORE, BLOOD
Fetal Fraction: 7
Result (T21): NEGATIVE
Trisomy 13 (Patau syndrome): NEGATIVE
Trisomy 18 (Edwards syndrome): NEGATIVE
Trisomy 21 (Down syndrome): NEGATIVE

## 2019-03-26 DIAGNOSIS — Z3481 Encounter for supervision of other normal pregnancy, first trimester: Secondary | ICD-10-CM | POA: Diagnosis not present

## 2019-04-02 LAB — INHERITEST CORE(CF97,SMA,FRAX)

## 2019-04-10 NOTE — L&D Delivery Note (Signed)
Delivery Note At 8:18 PM a viable and healthy female was delivered via Vaginal, Spontaneous (Presentation:  Direct Occiput Anterior).  APGAR: , ; weight  .   Placenta status: Spontaneous, Intact.  Cord: 3 vessels with the following complications: None.    Anesthesia: Local Episiotomy:   Lacerations: 1st degree;Labial Suture Repair: n/a Est. Blood Loss (mL): 200  Mom to postpartum.  Baby to Couplet care / Skin to Skin.  Julia Blanchard is a 29 y.o. female G60P0030 with IUP at [redacted]w[redacted]d admitted for IOL for A2DM .  She progressed with augmentation with Cytotec and AROM to complete and pushed less than 1 hour to deliver.  Cord clamping delayed by 1 minute then clamped by CNM and cut by FOB.  Placenta intact and spontaneous, bleeding minimal.  Intact perineum, first degree left labial laceration, hemostatic not repaired.  Mom and baby stable prior to transfer to postpartum. She plans on breastfeeding. She requests Nexplanon for birth control.   [name] 09/19/2019, 8:57 PM

## 2019-04-28 ENCOUNTER — Ambulatory Visit (INDEPENDENT_AMBULATORY_CARE_PROVIDER_SITE_OTHER): Payer: Medicaid Other | Admitting: Obstetrics and Gynecology

## 2019-04-28 ENCOUNTER — Other Ambulatory Visit: Payer: Self-pay

## 2019-04-28 ENCOUNTER — Ambulatory Visit (INDEPENDENT_AMBULATORY_CARE_PROVIDER_SITE_OTHER): Payer: Medicaid Other

## 2019-04-28 VITALS — BP 115/81 | HR 86 | Wt 221.0 lb

## 2019-04-28 DIAGNOSIS — Z363 Encounter for antenatal screening for malformations: Secondary | ICD-10-CM | POA: Diagnosis not present

## 2019-04-28 DIAGNOSIS — Z331 Pregnant state, incidental: Secondary | ICD-10-CM

## 2019-04-28 DIAGNOSIS — Z1389 Encounter for screening for other disorder: Secondary | ICD-10-CM

## 2019-04-28 DIAGNOSIS — Z3A18 18 weeks gestation of pregnancy: Secondary | ICD-10-CM

## 2019-04-28 DIAGNOSIS — Z3481 Encounter for supervision of other normal pregnancy, first trimester: Secondary | ICD-10-CM

## 2019-04-28 DIAGNOSIS — Z1379 Encounter for other screening for genetic and chromosomal anomalies: Secondary | ICD-10-CM

## 2019-04-28 NOTE — Progress Notes (Addendum)
[place] 18+4 wks,breech,anterior placenta gr 0,cx 3.7,svp of fluid 6.3 cm,normal ovaries,fhr 174 bpm,efw 332 g 99%,anatomy complete

## 2019-04-28 NOTE — Progress Notes (Signed)
   LOW-RISK PREGNANCY VISIT Patient name: Julia Blanchard MRN [id number]  Date of birth: Jun 02, 1990 Chief Complaint:   Routine Prenatal Visit  History of Present Illness:   Julia Blanchard is a 29 y.o. G23P0030 female at [redacted]w[redacted]d with an Estimated Date of Delivery: 09/25/19 being seen today for ongoing management of a low-risk pregnancy.  Today she reports no complaints. Contractions: Not present. Vag. Bleeding: None.  Movement: Present. denies leaking of fluid. Review of Systems:   Pertinent items are noted in HPI Denies abnormal vaginal discharge w/ itching/odor/irritation, headaches, visual changes, shortness of breath, chest pain, abdominal pain, severe nausea/vomiting, or problems with urination or bowel movements unless otherwise stated above. Pertinent History Reviewed:  Reviewed past medical,surgical, social, obstetrical and family history.  Reviewed problem list, medications and allergies. Physical Assessment:   Vitals:   04/28/19 1426  BP: 115/81  Pulse: 86  Weight: 221 lb (100.2 kg)  Body mass index is 41.76 kg/m.        Physical Examination:   General appearance: Well appearing, and in no distress  Mental status: Alert, oriented to person, place, and time  Skin: Warm & dry  Cardiovascular: Normal heart rate noted  Respiratory: Normal respiratory effort, no distress  Abdomen: Soft, gravid, nontender  Pelvic: Cervical exam deferred         Extremities: Edema: None  Fetal Status:     u/s today  TECHNICIAN COMMENTS:  [place] 18+4 wks,breech,anterior placenta gr 0,cx 3.7,svp of fluid 6.3 cm,normal ovaries,fhr 174 bpm,efw 332 g 99%,anatomy complete   Chaperone: n/a    No results found for this or any previous visit (from the past 24 hour(s)).  Assessment & Plan:  1) Low-risk pregnancy G4P0030 at [redacted]w[redacted]d with an Estimated Date of Delivery: 09/25/19   2) NT/ IT completed labs today,     Meds: No orders of the defined types were placed in this encounter.  Labs/procedures  today: u/s  Plan:  Continue routine obstetrical care  Next visit: prefers online    Reviewed: Classes and general obstetric precautions including but not limited to vaginal bleeding, contractions, leaking of fluid and fetal movement were reviewed in detail with the patient.  All questions were answered. has home bp cuff.. Check bp weekly, let us know if >140/90.   Follow-up: No follow-ups on file.  Orders Placed This Encounter  Procedures  . INTEGRATED 2  . POC Urinalysis Dipstick OB   [name] MD 04/28/2019 3:20 PM

## 2019-04-28 NOTE — Patient Instructions (Signed)
[phone] is the phone number for Pregnancy Classes or hospital tours at [place] will be referred to  [email or web address]   for more information on childbirth classes   At this site you may register for classes. You may sign up for a waiting list if classes are full. Please SIGN UP FOR THIS!.   When the waiting list becomes long, sometimes new classes can be added.  [facility] [place] [facility] Call to Register: [phone] or [phone]   or   Register Online: [email or web address] THESE CLASSES FILL UP VERY QUICKLY, SO SIGN UP AS SOON AS YOU CAN!!! lease visit [facility] pregnancy website at [email or web address]  Childbirth Classes  Option 1: Birth & Baby Series ? Series of 3 weekly classes, on the same day of the week (can choose Mon-Thurs) from 6-9pm ? Helps you and your support person prepare for childbirth ? Reviews newborn care, labor & birth, cesarean birth, pain management, and comfort techniques ? Cost: $60 per couple for insured or self-pay, $30 per couple for Medicaid  Option 2: Weekend Birth & Baby ? This class is a weekend version of our Birth & Baby series.  It is designed for parents who have a difficult time fitting several weeks of classes into their schedule.   ? Covers the care of your newborn and the basics of labor and childbirth ? Friday 6:30pm-8:30pm Saturday 9am-4pm, includes lunch for you and your partner  ? Cost: $75 per couple for insured or self-pay, $30 per couple for Medicaid  Option 3: Natural Childbirth ? This series of 5 weekly classes is for expectant parents who want to learn and practice natural methods of coping with the process of labor and childbirth.  Can choose Mon or Tues, 7-9pm.   ? Covers relaxation, breathing, massage, visualization, role of the partner, and helpful positioning ? Participants learn how to be confident  in their body's ability to give birth. Class empowers and helps parents make informed decisions about care. Includes discussion that will help new parents transition into the immediate postpartum period.  ? Cost: $75 per couple for insured or self-pay, $30 per couple for Medicaid  Option 4: Online Birth & Baby ? This online class offers you the freedom to complete a Birth & Baby series in the comfort of your own home.  The flexibility of this option allows you to review sections at your own pace, at times convenient to you and your support people.  It includes additional video information, animations, quizzes and extended activities. Get organized with helpful eClass tools, checklists, and trackers.  ? Cost: $60 for 60 days of online access                                                                            Other Available Classes  Baby & Me Enjoy this time to discuss newborn & infant parenting topics and family adjustment issues with other new mothers in a relaxed environment. Each week brings a new speaker or baby-centered activity. We encourage mothers and their babies (birth to crawling) to join [place]. You are welcome to visit this group even if you haven't delivered yet! It's wonderful to make new friends early  and watch other moms interact with their babies. No registration or fee.  Big Brother/Big Sister Let your children share in the joy of a new brother or sister in this special class designed just for them. Discussion includes how families care for babies: swaddling, holding, diapering, safety, as well as how they can be helpful in their new role. This class is designed for children ages 2 to 6, but any age is welcome. Please register each child individually. $5 Breastfeeding Support Group This group is a mother-to-mother support circle where moms have the opportunity to share their breastfeeding experiences. A Breastfeeding Support nurse is present for questions and concerns. An infant  scale is available for weight checks. No fee or registration.  Breastfeeding Your Baby Breastfeeding is a special time for mother and child. This class will help you feel ready to begin this important relationship. Your partner is encouraged to attend with you. Learn what to expect and feel more confident in the first days of breastfeeding your newborn. This class also addresses the most common fears and challenges of breastfeeding during the first few weeks, months, and beyond. $30 per couple Caring for Baby This class is for expectant and adoptive parents who want to learn and practice the most up-to-date newborn care for their babies. Focus is on birth through first six weeks of life. Topics include feeding, bathing, diapering, crying, umbilical cord care, circumcision care and safe sleep. Parents learn how to recognize symptoms of illness and when to call the pediatrician. Register only the mom-to-be and your partner can plan to come with you. (*Note: This class is included in the Birth & Baby series and the Weekend Birth & Baby classes.) $10 per couple Comfort Techniques & Tour This 2-hour interactive class is designed for those who either do not wish to take the Birth & Baby series or for those who prefer our online childbirth class, but don't want to miss the opportunity to learn and practice hands-on techniques. These skills can help relieve some of the discomfort of labor and encourage your baby to rotate toward the best position for birth. You and your partner will be able to try a variety of labor positions with birth balls and rebozos as well as practice breathing, relaxation, and visual techniques. $20 per couple Daddy Boot Camp This course offers Dads-to-be the tools and knowledge needed to feel confident on their journey to becoming new fathers. Experienced dads, who have been trained as coaches, teach dads-to-be how to hold, comfort, diapers, swaddle and play with their infant while being  able to support the new mom as well. $25 Grandparent Love Expecting a grandbaby? Learn about the latest infant care and safety recommendations and ways to support your own child as he or she transitions into the parenting role. $10 per person Infant and Child CPR Parents, grandparents, babysitters, and friends learn Cardio-Pulmonary Resuscitation skills for infants and children. You will also learn how to treat both conscious and unconscious choking infants and children. Register each participant individually. (Note: This Family & Friends program does not offer certification.) $20 per person Marvelous Multiples Expecting twins, triplets, or more? This free 2-hour class covers the differences in labor, birth, parenting, and breastfeeding issues that face multiples' parents.  Maternity Care Center Virtual Tour  Online virtual tour of the new [facility] at [place]  Mom Talk This free mom-led group offers support and connection to mothers as they journey through the adjustments and struggles of that   sometimes overwhelming first year after the birth of a child. A member of our staff will be present to share resources and additional support if needed, as you care for yourself and baby. You are welcome to visit this group before you deliver! It's wonderful to meet new friends early and watch other moms interact with their babies.  Waterbirth Class Interested in a waterbirth? This free informational class will help you discover whether waterbirth is the right fit for you and is required if you are planning a waterbirth. Education about waterbirth itself, supplies you may need, and what you may need from your support team is included in this class. Partners are encouraged to come.    

## 2019-04-30 LAB — INTEGRATED 2
AFP MoM: 2.11
Alpha-Fetoprotein: 76.6 ng/mL
Crown Rump Length: 72.1 mm
DIA MoM: 0.97
DIA Value: 138.9 pg/mL
Estriol, Unconjugated: 2.24 ng/mL
Gest. Age on Collection Date: 13.1 weeks
Gestational Age: 19.1 weeks
Maternal Age at EDD: 27.8 yr
Nuchal Translucency (NT): 1.9 mm
Nuchal Translucency MoM: 1.15
Number of Fetuses: 1
PAPP-A MoM: 1.04
PAPP-A Value: 784.3 ng/mL
Test Results:: NEGATIVE
Weight: 214 [lb_av]
Weight: 214 [lb_av]
hCG MoM: 2.9
hCG Value: 51.5 IU/mL
uE3 MoM: 1.33

## 2019-05-22 ENCOUNTER — Telehealth: Payer: Self-pay | Admitting: Obstetrics & Gynecology

## 2019-05-22 MED ORDER — OMEPRAZOLE 20 MG PO CPDR: 20.0000 mg | DELAYED_RELEASE_CAPSULE | Freq: Every day | ORAL | 6 refills | Status: DC

## 2019-05-26 ENCOUNTER — Telehealth: Payer: Medicaid Other | Admitting: Obstetrics & Gynecology

## 2019-05-27 ENCOUNTER — Encounter: Payer: Self-pay | Admitting: Obstetrics and Gynecology

## 2019-05-27 ENCOUNTER — Telehealth: Payer: Medicaid Other | Admitting: Obstetrics and Gynecology

## 2019-05-27 ENCOUNTER — Telehealth (INDEPENDENT_AMBULATORY_CARE_PROVIDER_SITE_OTHER): Payer: Medicaid Other | Admitting: Obstetrics and Gynecology

## 2019-05-27 VITALS — BP 135/86 | HR 102 | Ht 62.0 in | Wt 223.0 lb

## 2019-05-27 DIAGNOSIS — Z3482 Encounter for supervision of other normal pregnancy, second trimester: Secondary | ICD-10-CM

## 2019-05-27 DIAGNOSIS — Z3A22 22 weeks gestation of pregnancy: Secondary | ICD-10-CM

## 2019-05-27 NOTE — Patient Instructions (Signed)
[phone] is the phone number for Pregnancy Classes or hospital tours at [facility].   You will be referred to  [email or web address]   for more information on childbirth classes   At this site you may register for classes. You may sign up for a waiting list if classes are full. Please SIGN UP FOR THIS!.   When the waiting list becomes long, sometimes new classes can be added.  [facility] [place] Call to Register: [phone] or [phone]   or   Register Online: [email or web address] THESE CLASSES FILL UP VERY QUICKLY, SO SIGN UP AS SOON AS YOU CAN!!! Please visit [facility] pregnancy website at [email or web address]  Childbirth Classes  Option 1: Birth & Baby Series ? Series of 3 weekly classes, on the same day of the week (can choose Mon-Thurs) from 6-9pm ? Helps you and your support person prepare for childbirth ? Reviews newborn care, labor & birth, cesarean birth, pain management, and comfort techniques ? Cost: $60 per couple for insured or self-pay, $30 per couple for Medicaid  Option 2: Weekend Birth & Baby ? This class is a weekend version of our Birth & Baby series.  It is designed for parents who have a difficult time fitting several weeks of classes into their schedule.   ? Covers the care of your newborn and the basics of labor and childbirth ? Friday 6:30pm-8:30pm Saturday 9am-4pm, includes lunch for you and your partner  ? Cost: $75 per couple for insured or self-pay, $30 per couple for Medicaid  Option 3: Natural Childbirth ? This series of 5 weekly classes is for expectant parents who want to learn and practice natural methods of coping with the process of labor and childbirth.  Can choose Mon or Tues, 7-9pm.   ? Covers relaxation, breathing, massage, visualization, role of the partner, and helpful positioning ? Participants learn how to be confident  in their body's ability to give birth. Class empowers and helps parents make informed decisions about care. Includes discussion that will help new parents transition into the immediate postpartum period.  ? Cost: $75 per couple for insured or self-pay, $30 per couple for Medicaid  Option 4: Online Birth & Baby ? This online class offers you the freedom to complete a Birth & Baby series in the comfort of your own home.  The flexibility of this option allows you to review sections at your own pace, at times convenient to you and your support people.  It includes additional video information, animations, quizzes and extended activities. Get organized with helpful eClass tools, checklists, and trackers.  ? Cost: $60 for 60 days of online access                                                                            Other Available Classes  Baby & Me Enjoy this time to discuss newborn & infant parenting topics and family adjustment issues with other new mothers in a relaxed environment. Each week brings a new speaker or baby-centered activity. We encourage mothers and their babies (birth to crawling) to join us. You are welcome to visit this group even if you haven't delivered yet! It's wonderful to make new friends early   and watch other moms interact with their babies. No registration or fee.  Big Brother/Big Sister Let your children share in the joy of a new brother or sister in this special class designed just for them. Discussion includes how families care for babies: swaddling, holding, diapering, safety, as well as how they can be helpful in their new role. This class is designed for children ages 2 to 6, but any age is welcome. Please register each child individually. $5 Breastfeeding Support Group This group is a mother-to-mother support circle where moms have the opportunity to share their breastfeeding experiences. A Breastfeeding Support nurse is present for questions and concerns. An infant  scale is available for weight checks. No fee or registration.  Breastfeeding Your Baby Breastfeeding is a special time for mother and child. This class will help you feel ready to begin this important relationship. Your partner is encouraged to attend with you. Learn what to expect and feel more confident in the first days of breastfeeding your newborn. This class also addresses the most common fears and challenges of breastfeeding during the first few weeks, months, and beyond. $30 per couple Caring for Baby This class is for expectant and adoptive parents who want to learn and practice the most up-to-date newborn care for their babies. Focus is on birth through first six weeks of life. Topics include feeding, bathing, diapering, crying, umbilical cord care, circumcision care and safe sleep. Parents learn how to recognize symptoms of illness and when to call the pediatrician. Register only the mom-to-be and your partner can plan to come with you. (*Note: This class is included in the Birth & Baby series and the Weekend Birth & Baby classes.) $10 per couple Comfort Techniques & Tour This 2-hour interactive class is designed for those who either do not wish to take the Birth & Baby series or for those who prefer our online childbirth class, but don't want to miss the opportunity to learn and practice hands-on techniques. These skills can help relieve some of the discomfort of labor and encourage your baby to rotate toward the best position for birth. You and your partner will be able to try a variety of labor positions with birth balls and rebozos as well as practice breathing, relaxation, and visual techniques. $20 per couple Daddy Boot Camp This course offers Dads-to-be the tools and knowledge needed to feel confident on their journey to becoming new fathers. Experienced dads, who have been trained as coaches, teach dads-to-be how to hold, comfort, diapers, swaddle and play with their infant while being  able to support the new mom as well. $25 Grandparent Love Expecting a grandbaby? Learn about the latest infant care and safety recommendations and ways to support your own child as he or she transitions into the parenting role. $10 per person Infant and Child CPR Parents, grandparents, babysitters, and friends learn Cardio-Pulmonary Resuscitation skills for infants and children. You will also learn how to treat both conscious and unconscious choking infants and children. Register each participant individually. (Note: This Family & Friends program does not offer certification.) $20 per person Marvelous Multiples Expecting twins, triplets, or more? This free 2-hour class covers the differences in labor, birth, parenting, and breastfeeding issues that face multiples' parents.  Maternity Care Center Virtual Tour  Online virtual tour of the new [facility] at [place]  Mom Talk This free mom-led group offers support and connection to mothers as they journey through the adjustments and struggles of that   sometimes overwhelming first year after the birth of a child. A member of our staff will be present to share resources and additional support if needed, as you care for yourself and baby. You are welcome to visit this group before you deliver! It's wonderful to meet new friends early and watch other moms interact with their babies.  Waterbirth Class Interested in a waterbirth? This free informational class will help you discover whether waterbirth is the right fit for you and is required if you are planning a waterbirth. Education about waterbirth itself, supplies you may need, and what you may need from your support team is included in this class. Partners are encouraged to come.    

## 2019-05-27 NOTE — Progress Notes (Addendum)
Patient ID: Julia Blanchard, female   DOB: [birth year], 29 y.o.   MRN: [id number]    [place] [facility] VIRTUAL VIDEO VISIT ENCOUNTER NOTE  Provider location: [facility] for [place] [facility]   I connected with Darrold Junker on 05/27/2019 at  3:10 PM EST by MyChart Video Encounter at home and verified that I am speaking with the correct person using two identifiers.   I discussed the limitations, risks, security and privacy concerns of performing an evaluation and management service virtually and the availability of in person appointments. I also discussed with the patient that there may be a patient responsible charge related to this service. The patient expressed understanding and agreed to proceed. Subjective:  Julia Blanchard is a 29 y.o. G4P0030 at [redacted]w[redacted]d being seen today for ongoing prenatal care.  She is currently monitored for the following issues for this low-risk pregnancy and has Supervision of normal pregnancy and Rh negative state in antepartum period on their problem list.on Omeprazole for GERD much improved.  Partner supportive classes planned for both. Patient reports no complaints.  Contractions: Not present. Vag. Bleeding: None.  Movement: Present. Denies any leaking of fluid.   The following portions of the patient's history were reviewed and updated as appropriate: allergies, current medications, past family history, past medical history, past social history, past surgical history and problem list.   Objective:   Vitals:   05/27/19 1554  BP: 135/86  Pulse: (!) 102  Weight: 223 lb (101.2 kg)  Height: 5\' 2"  (1.575 m)    Fetal Status:     Movement: Present     General:  Alert, oriented and cooperative. Patient is in no acute distress.  Respiratory: Normal respiratory effort, no problems with respiration noted  Mental Status: Normal mood and affect. Normal behavior. Normal judgment and thought content.  Rest of physical exam deferred due to  type of encounter  Imaging: US OB Comp + 14 Wk  Result Date: 04/28/2019 SECOND TRIMESTER ANATOMY SONOGRAM Julia Blanchard is in the office for second trimester anatomy sonogram. She is a 29 y.o. year old G82P0030 with Estimated Date of Delivery: 09/25/19 by [name] now at  [redacted]w[redacted]d weeks gestation. Thus far the pregnancy has been uncomplicated. GESTATION:SINGLETON PRESENTATION: breech FETAL ACTIVITY:          Heart rate         174          The fetus is active. AMNIOTIC FLUID: The amniotic fluid volume is  normal, SDP : 6.3 cm. PLACENTA LOCALIZATION:  anterior GRADE 0 CERVIX: Measures 3.7 cm ADNEXA: The ovaries are normal. GESTATIONAL AGE AND  BIOMETRICS: Gestational criteria: Estimated Date of Delivery: 09/25/19 by [name] now at [redacted]w[redacted]d Previous Scans:2          BIPARIETAL DIAMETER           4.39 cm         19+1 weeks HEAD CIRCUMFERENCE           16.69 cm         19+2 weeks ABDOMINAL CIRCUMFERENCE           15.87 cm         20+6 weeks   97% FEMUR LENGTH           3 cm         191 weeks  AVERAGE EGA(BY THIS SCAN):  332 weeks                                                 ESTIMATED FETAL WEIGHT:       332  grams, 99 % ANATOMICAL SURVEY                                                                            COMMENTS CEREBRAL VENTRICLES yes normal  CHOROID PLEXUS yes normal  CEREBELLUM yes normal  CISTERNA MAGNA yes normal  NUCHAL REGION yes normal  ORBITS yes normal  NASAL BONE yes normal  NOSE/LIP yes normal  FACIAL PROFILE yes normal  4 CHAMBERED HEART yes normal  OUTFLOW TRACTS yes normal  DIAPHRAGM yes normal  STOMACH yes normal  RENAL REGION yes normal  BLADDER yes normal  CORD INSERTION yes normal  3 VESSEL CORD yes normal  SPINE yes normal  ARMS/HANDS yes normal  LEGS/FEET yes normal  GENITALIA yes normal female     SUSPECTED ABNORMALITIES: EFW 99% QUALITY OF SCAN: satisfactory TECHNICIAN COMMENTS: [place] 18+4 wks,breech,anterior placenta gr 0,cx 3.7,svp of fluid 6.3  cm,normal ovaries,fhr 174 bpm,efw 332 g 99%,anatomy complete A copy of this report including all images has been saved and backed up to a second source for retrieval if needed. All measures and details of the anatomical scan, placentation, fluid volume and pelvic anatomy are contained in that report. [name] 04/28/2019 2:27 PM Clinical Impression and recommendations: I have reviewed the sonogram results above, combined with the patient's current clinical course, below are my impressions and any appropriate recommendations for management based on the sonographic findings. 1.  [id number] Estimated Date of Delivery: 09/25/19 by  [name] and confirmed by today's sonographic dating 2.  Normal fetal sonographic findings, specifically normal detailed anatomical evaluation,      no abnormalities noted 3.  Normal general sonographic findings Recommend routine prenatal care based on this sonogram or as clinically indicated [name] 04/28/2019 11:03 PM    Assessment and Plan:  Pregnancy: G4P0030 at [redacted]w[redacted]d  Preterm labor symptoms and general obstetric precautions including but not limited to vaginal bleeding, contractions, leaking of fluid and fetal movement were reviewed in detail with the patient. I discussed the assessment and treatment plan with the patient. The patient was provided an opportunity to ask questions and all were answered. The patient agreed with the plan and demonstrated an understanding of the instructions. The patient was advised to call back or seek an in-person office evaluation/go to [facility] at [facility] for any urgent or concerning symptoms. Please refer to After Visit Summary for other counseling recommendations.   I provided 10 minutes of face-to-face time during this encounter.  Return in about 4 weeks (around 06/24/2019) for PN2 (sugar test).  Future Appointments  Date Time Provider [place]  06/23/2019  9:00 AM [facility]  06/23/2019  9:30  AM Roma Schanz, CNM [facility]    By signing my name below, I, Samul Dada, attest that this documentation has been  prepared under the direction and in the presence of [name], MD. Electronically Signed: [place]. 05/27/19. 4:10 PM.  I personally performed the services described in this documentation, which was SCRIBED in my presence. The recorded information has been reviewed and considered accurate. It has been edited as necessary during review. [name], MD

## 2019-06-08 ENCOUNTER — Other Ambulatory Visit: Payer: Self-pay

## 2019-06-08 ENCOUNTER — Other Ambulatory Visit (INDEPENDENT_AMBULATORY_CARE_PROVIDER_SITE_OTHER): Payer: Medicaid Other | Admitting: *Deleted

## 2019-06-08 VITALS — BP 126/83 | HR 105

## 2019-06-08 DIAGNOSIS — Z0130 Encounter for examination of blood pressure without abnormal findings: Secondary | ICD-10-CM

## 2019-06-08 DIAGNOSIS — Z331 Pregnant state, incidental: Secondary | ICD-10-CM

## 2019-06-08 DIAGNOSIS — Z3A24 24 weeks gestation of pregnancy: Secondary | ICD-10-CM

## 2019-06-08 DIAGNOSIS — Z3482 Encounter for supervision of other normal pregnancy, second trimester: Secondary | ICD-10-CM

## 2019-06-08 LAB — POCT URINALYSIS DIPSTICK OB
Blood, UA: NEGATIVE
Glucose, UA: NEGATIVE
Ketones, UA: NEGATIVE
Leukocytes, UA: NEGATIVE
Nitrite, UA: NEGATIVE
POC,PROTEIN,UA: NEGATIVE

## 2019-06-09 NOTE — Progress Notes (Signed)
   NURSE VISIT- BLOOD PRESSURE CHECK  SUBJECTIVE:  Julia Blanchard is a 29 y.o. G76P0030 female here for BP check and urine dip to check for protein. She is [redacted]w[redacted]d pregnant with c/o increased swelling in her hands.   HYPERTENSION ROS:  Pregnant:  . Severe headaches that don't go away with tylenol/other medicines: No  . Visual changes (seeing spots/double/blurred vision) No  . Severe pain under right breast breast or in center of upper chest No  . Severe nausea/vomiting No  . Taking medicines as instructed not applicable  OBJECTIVE:  BP 126/83 (BP Location: Right Arm, Patient Position: Sitting, Cuff Size: Normal)   Pulse (!) 105   [name] 12/19/2018 (Exact Date)   Appearance alert, well appearing, and in no distress and oriented to person, place, and time.  ASSESSMENT: Pregnancy [redacted]w[redacted]d  blood pressure check  PLAN: Discussed with Knute Neu, CNM, [facility]   Recommendations: no changes needed, monitor BP 2-4 times daily. Follow-up: as scheduled   Alice Rieger  06/09/2019 8:18 AM

## 2019-06-12 ENCOUNTER — Other Ambulatory Visit: Payer: Medicaid Other

## 2019-06-23 ENCOUNTER — Other Ambulatory Visit: Payer: Self-pay

## 2019-06-23 ENCOUNTER — Other Ambulatory Visit: Payer: Medicaid Other

## 2019-06-23 ENCOUNTER — Ambulatory Visit (INDEPENDENT_AMBULATORY_CARE_PROVIDER_SITE_OTHER): Payer: Medicaid Other | Admitting: Women's Health

## 2019-06-23 ENCOUNTER — Encounter: Payer: Self-pay | Admitting: Women's Health

## 2019-06-23 VITALS — BP 125/78 | HR 104 | Wt 228.6 lb

## 2019-06-23 DIAGNOSIS — Z23 Encounter for immunization: Secondary | ICD-10-CM | POA: Diagnosis not present

## 2019-06-23 DIAGNOSIS — Z1389 Encounter for screening for other disorder: Secondary | ICD-10-CM

## 2019-06-23 DIAGNOSIS — Z131 Encounter for screening for diabetes mellitus: Secondary | ICD-10-CM

## 2019-06-23 DIAGNOSIS — Z3482 Encounter for supervision of other normal pregnancy, second trimester: Secondary | ICD-10-CM

## 2019-06-23 DIAGNOSIS — Z3A26 26 weeks gestation of pregnancy: Secondary | ICD-10-CM | POA: Diagnosis not present

## 2019-06-23 DIAGNOSIS — Z331 Pregnant state, incidental: Secondary | ICD-10-CM

## 2019-06-23 LAB — POCT URINALYSIS DIPSTICK OB
Blood, UA: NEGATIVE
Glucose, UA: NEGATIVE
Ketones, UA: NEGATIVE
Leukocytes, UA: NEGATIVE
Nitrite, UA: NEGATIVE
POC,PROTEIN,UA: NEGATIVE

## 2019-06-23 NOTE — Patient Instructions (Signed)
Adana Daignault, I greatly value your feedback.  If you receive a survey following your visit with [place] today, we appreciate you taking the time to fill it out.  Thanks, Knute Neu, CNM, [facility]  [place]!!! It is now [place] at [facility] ([place], [place] [address]) Entrance located off of [address] parking    Go to [email or web address] to register for FREE online childbirth classes   Call the office [phone]) or go to [facility] if:  You begin to have strong, frequent contractions  Your water breaks.  Sometimes it is a big gush of fluid, sometimes it is just a trickle that keeps getting your panties wet or running down your legs  You have vaginal bleeding.  It is normal to have a small amount of spotting if your cervix was checked.   You don't feel your baby moving like normal.  If you don't, get you something to eat and drink and lay down and focus on feeling your baby move.  You should feel at least 10 movements in 2 hours.  If you don't, you should call the office or go to [facility].    Tdap Vaccine  It is recommended that you get the Tdap vaccine during the third trimester of EACH pregnancy to help protect your baby from getting pertussis (whooping cough)  27-36 weeks is the BEST time to do this so that you can pass the protection on to your baby. During pregnancy is better than after pregnancy, but if you are unable to get it during pregnancy it will be offered at the hospital.   You can get this vaccine with [place], at the health department, your family doctor, or some local pharmacies  Everyone who will be around your baby should also be up-to-date on their vaccines before the baby comes. Adults (who are not pregnant) only need 1 dose of Tdap during adulthood.   [place] Pediatricians/Family Doctors:  [facility] [phone]                  [place] [phone] (usually not accepting new patients unless you have family there already, you are always welcome to call and ask)       [facility] [phone]       [facility] Pediatricians/Family Doctors:   [facility]: [phone]  [facility]: [phone]  [facility]: [place] Doctors:   [facility]: [facility]: [place]:  [place]: [phone]   Home Blood Pressure Monitoring for Patients   Your provider has recommended that you check your blood pressure (BP) at least once a week at home. If you do not have a blood pressure cuff at home, one will be provided for you. Contact your provider if you have not received your monitor within 1 week.   Helpful Tips for Accurate Home Blood Pressure Checks  . Don't smoke, exercise, or drink caffeine 30 minutes before checking your BP . Use the restroom before checking your BP (a full bladder can raise your pressure) . Relax in a comfortable upright chair . Feet on the ground . Left arm resting comfortably on a flat surface at the level of your heart . Legs uncrossed . Back supported . Sit quietly and don't talk .  Place the cuff on your bare arm . Adjust snuggly, so that only two fingertips can fit between your skin and the top of the cuff . Check 2 readings separated by at least one minute . Keep a log of your BP readings . For a visual, please reference this diagram: http://ccnc.care/bpdiagram  Provider Name: [facility]     Phone: [phone]  Zone 1: ALL CLEAR  Continue to monitor your symptoms:  . BP reading is less than 140 (top number) or less than 90 (bottom number)  . No right upper stomach pain . No headaches or seeing spots . No feeling nauseated or throwing up . No swelling in face and  hands  Zone 2: CAUTION Call your doctor's office for any of the following:  . BP reading is greater than 140 (top number) or greater than 90 (bottom number)  . Stomach pain under your ribs in the middle or right side . Headaches or seeing spots . Feeling nauseated or throwing up . Swelling in face and hands  Zone 3: EMERGENCY  Seek immediate medical care if you have any of the following:  . BP reading is greater than160 (top number) or greater than 110 (bottom number) . Severe headaches not improving with Tylenol . Serious difficulty catching your breath . Any worsening symptoms from Zone 2   Third Trimester of Pregnancy The third trimester is from week 29 through week 42, months 7 through 9. The third trimester is a time when the fetus is growing rapidly. At the end of the ninth month, the fetus is about 20 inches in length and weighs 6-10 pounds.  BODY CHANGES Your body goes through many changes during pregnancy. The changes vary from woman to woman.   Your weight will continue to increase. You can expect to gain 25-35 pounds (11-16 kg) by the end of the pregnancy.  You may begin to get stretch marks on your hips, abdomen, and breasts.  You may urinate more often because the fetus is moving lower into your pelvis and pressing on your bladder.  You may develop or continue to have heartburn as a result of your pregnancy.  You may develop constipation because certain hormones are causing the muscles that push waste through your intestines to slow down.  You may develop hemorrhoids or swollen, bulging veins (varicose veins).  You may have pelvic pain because of the weight gain and pregnancy hormones relaxing your joints between the bones in your pelvis. Backaches may result from overexertion of the muscles supporting your posture.  You may have changes in your hair. These can include thickening of your hair, rapid growth, and changes in texture. Some women also have hair loss during  or after pregnancy, or hair that feels dry or thin. Your hair will most likely return to normal after your baby is born.  Your breasts will continue to grow and be tender. A yellow discharge may leak from your breasts called colostrum.  Your belly button may stick out.  You may feel short of breath because of your expanding uterus.  You may notice the fetus "dropping," or moving lower in your abdomen.  You may have a bloody mucus discharge. This usually occurs a few days to a week before labor begins.  Your cervix becomes thin and soft (effaced) near your due date. WHAT TO EXPECT AT YOUR PRENATAL EXAMS  You will have prenatal exams every 2 weeks until week 36. Then, you will have weekly prenatal exams. During  a routine prenatal visit:  You will be weighed to make sure you and the fetus are growing normally.  Your blood pressure is taken.  Your abdomen will be measured to track your baby's growth.  The fetal heartbeat will be listened to.  Any test results from the previous visit will be discussed.  You may have a cervical check near your due date to see if you have effaced. At around 36 weeks, your caregiver will check your cervix. At the same time, your caregiver will also perform a test on the secretions of the vaginal tissue. This test is to determine if a type of bacteria, Group B streptococcus, is present. Your caregiver will explain this further. Your caregiver may ask you:  What your birth plan is.  How you are feeling.  If you are feeling the baby move.  If you have had any abnormal symptoms, such as leaking fluid, bleeding, severe headaches, or abdominal cramping.  If you have any questions. Other tests or screenings that may be performed during your third trimester include:  Blood tests that check for low iron levels (anemia).  Fetal testing to check the health, activity level, and growth of the fetus. Testing is done if you have certain medical conditions or if  there are problems during the pregnancy. FALSE LABOR You may feel small, irregular contractions that eventually go away. These are called Braxton Hicks contractions, or false labor. Contractions may last for hours, days, or even weeks before true labor sets in. If contractions come at regular intervals, intensify, or become painful, it is best to be seen by your caregiver.  SIGNS OF LABOR   Menstrual-like cramps.  Contractions that are 5 minutes apart or less.  Contractions that start on the top of the uterus and spread down to the lower abdomen and back.  A sense of increased pelvic pressure or back pain.  A watery or bloody mucus discharge that comes from the vagina. If you have any of these signs before the 37th week of pregnancy, call your caregiver right away. You need to go to the hospital to get checked immediately. HOME CARE INSTRUCTIONS   Avoid all smoking, herbs, alcohol, and unprescribed drugs. These chemicals affect the formation and growth of the baby.  Follow your caregiver's instructions regarding medicine use. There are medicines that are either safe or unsafe to take during pregnancy.  Exercise only as directed by your caregiver. Experiencing uterine cramps is a good sign to stop exercising.  Continue to eat regular, healthy meals.  Wear a good support bra for breast tenderness.  Do not use hot tubs, steam rooms, or saunas.  Wear your seat belt at all times when driving.  Avoid raw meat, uncooked cheese, cat litter boxes, and soil used by cats. These carry germs that can cause birth defects in the baby.  Take your prenatal vitamins.  Try taking a stool softener (if your caregiver approves) if you develop constipation. Eat more high-fiber foods, such as fresh vegetables or fruit and whole grains. Drink plenty of fluids to keep your urine clear or pale yellow.  Take warm sitz baths to soothe any pain or discomfort caused by hemorrhoids. Use hemorrhoid cream if your  caregiver approves.  If you develop varicose veins, wear support hose. Elevate your feet for 15 minutes, 3-4 times a day. Limit salt in your diet.  Avoid heavy lifting, wear low heal shoes, and practice good posture.  Rest a lot with your legs elevated if you  have leg cramps or low back pain.  Visit your dentist if you have not gone during your pregnancy. Use a soft toothbrush to brush your teeth and be gentle when you floss.  A sexual relationship may be continued unless your caregiver directs you otherwise.  Do not travel far distances unless it is absolutely necessary and only with the approval of your caregiver.  Take prenatal classes to understand, practice, and ask questions about the labor and delivery.  Make a trial run to the hospital.  Pack your hospital bag.  Prepare the baby's nursery.  Continue to go to all your prenatal visits as directed by your caregiver. SEEK MEDICAL CARE IF:  You are unsure if you are in labor or if your water has broken.  You have dizziness.  You have mild pelvic cramps, pelvic pressure, or nagging pain in your abdominal area.  You have persistent nausea, vomiting, or diarrhea.  You have a bad smelling vaginal discharge.  You have pain with urination. SEEK IMMEDIATE MEDICAL CARE IF:   You have a fever.  You are leaking fluid from your vagina.  You have spotting or bleeding from your vagina.  You have severe abdominal cramping or pain.  You have rapid weight loss or gain.  You have shortness of breath with chest pain.  You notice sudden or extreme swelling of your face, hands, ankles, feet, or legs.  You have not felt your baby move in over an hour.  You have severe headaches that do not go away with medicine.  You have vision changes. Document Released: 03/20/2001 Document Revised: 03/31/2013 Document Reviewed: 05/27/2012 [facility] Patient Information 2015 [place], [place]. This information is not intended to replace advice  given to you by your health care provider. Make sure you discuss any questions you have with your health care provider.  PROTECT YOURSELF & YOUR BABY FROM THE FLU! Because you are pregnant, we at [facility], along with the Centers for Disease Control (CDC), recommend that you receive the flu vaccine to protect yourself and your baby from the flu. The flu is more likely to cause severe illness in pregnant women than in women of reproductive age who are not pregnant. Changes in the immune system, heart, and lungs during pregnancy make pregnant women (and women up to two weeks postpartum) more prone to severe illness from flu, including illness resulting in hospitalization. Flu also may be harmful for a pregnant woman's developing baby. A common flu symptom is fever, which may be associated with neural tube defects and other adverse outcomes for a developing baby. Getting vaccinated can also help protect a baby after birth from flu. (Mom passes antibodies onto the developing baby during her pregnancy.)  A Flu Vaccine is the Best Protection Against Flu Getting a flu vaccine is the first and most important step in protecting against flu. Pregnant women should get a flu shot and not the live attenuated influenza vaccine (LAIV), also known as nasal spray flu vaccine. Flu vaccines given during pregnancy help protect both the mother and her baby from flu. Vaccination has been shown to reduce the risk of flu-associated acute respiratory infection in pregnant women by up to one-half. A 2018 study showed that getting a flu shot reduced a pregnant woman's risk of being hospitalized with flu by an average of 40 percent. Pregnant women who get a flu vaccine are also helping to protect their babies from flu illness for the first several months after their birth, when they are too  young to get vaccinated.   A Long Record of Safety for Flu Shots in Pregnant Women Flu shots have been given to millions of pregnant women over  many years with a good safety record. There is a lot of evidence that flu vaccines can be given safely during pregnancy; though these data are limited for the first trimester. The CDC recommends that pregnant women get vaccinated during any trimester of their pregnancy. It is very important for pregnant women to get the flu shot.   Other Preventive Actions In addition to getting a flu shot, pregnant women should take the same everyday preventive actions the CDC recommends of everyone, including covering coughs, washing hands often, and avoiding people who are sick.  Symptoms and Treatment If you get sick with flu symptoms call your doctor right away. There are antiviral drugs that can treat flu illness and prevent serious flu complications. The CDC recommends prompt treatment for people who have influenza infection or suspected influenza infection and who are at high risk of serious flu complications, such as people with asthma, diabetes (including gestational diabetes), or heart disease. Early treatment of influenza in hospitalized pregnant women has been shown to reduce the length of the hospital stay.  Symptoms Flu symptoms include fever, cough, sore throat, runny or stuffy nose, body aches, headache, chills and fatigue. Some people may also have vomiting and diarrhea. People may be infected with the flu and have respiratory symptoms without a fever.  Early Treatment is Important for Pregnant Women Treatment should begin as soon as possible because antiviral drugs work best when started early (within 48 hours after symptoms start). Antiviral drugs can make your flu illness milder and make you feel better faster. They may also prevent serious health problems that can result from flu illness. Oral oseltamivir (Tamiflu) is the preferred treatment for pregnant women because it has the most studies available to suggest that it is safe and beneficial. Antiviral drugs require a prescription from your  provider. Having a fever caused by flu infection or other infections early in pregnancy may be linked to birth defects in a baby. In addition to taking antiviral drugs, pregnant women who get a fever should treat their fever with Tylenol (acetaminophen) and contact their provider immediately.  When to [place] If you are pregnant and have any of these signs, seek care immediately:  Difficulty breathing or shortness of breath  Pain or pressure in the chest or abdomen  Sudden dizziness  Confusion  Severe or persistent vomiting  High fever that is not responding to Tylenol (or store brand equivalent)  Decreased or no movement of your baby  [email or web address]

## 2019-06-23 NOTE — Progress Notes (Signed)
   LOW-RISK PREGNANCY VISIT Patient name: Julia Blanchard MRN [id number]  Date of birth: 08/24/1990 Chief Complaint:   Routine Prenatal Visit  History of Present Illness:   Julia Blanchard is a 29 y.o. G7P0030 female at [redacted]w[redacted]d with an Estimated Date of Delivery: 09/25/19 being seen today for ongoing management of a low-risk pregnancy.  Today she reports no complaints. Contractions: Not present. Vag. Bleeding: None.  Movement: Present. denies leaking of fluid. Review of Systems:   Pertinent items are noted in HPI Denies abnormal vaginal discharge w/ itching/odor/irritation, headaches, visual changes, shortness of breath, chest pain, abdominal pain, severe nausea/vomiting, or problems with urination or bowel movements unless otherwise stated above. Pertinent History Reviewed:  Reviewed past medical,surgical, social, obstetrical and family history.  Reviewed problem list, medications and allergies. Physical Assessment:   Vitals:   06/23/19 0930  BP: 125/78  Pulse: (!) 104  Weight: 228 lb 9.6 oz (103.7 kg)  Body mass index is 41.81 kg/m.        Physical Examination:   General appearance: Well appearing, and in no distress  Mental status: Alert, oriented to person, place, and time  Skin: Warm & dry  Cardiovascular: Normal heart rate noted  Respiratory: Normal respiratory effort, no distress  Abdomen: Soft, gravid, nontender  Pelvic: Cervical exam deferred         Extremities: Edema: Trace  Fetal Status: Fetal Heart Rate (bpm): 150 Fundal Height: 28 cm Movement: Present     Chaperone: n/a    Results for orders placed or performed in visit on 06/23/19 (from the past 24 hour(s))  POC Urinalysis Dipstick OB   Collection Time: 06/23/19  9:34 AM  Result Value Ref Range   Color, UA     Clarity, UA     Glucose, UA Negative Negative   Bilirubin, UA     Ketones, UA n    Spec Grav, UA     Blood, UA n    pH, UA     POC,PROTEIN,UA Negative Negative, Trace, Small (1+), Moderate (2+),  Large (3+), 4+   Urobilinogen, UA     Nitrite, UA n    Leukocytes, UA Negative Negative   Appearance     Odor      Assessment & Plan:  1) Low-risk pregnancy G4P0030 at [redacted]w[redacted]d with an Estimated Date of Delivery: 09/25/19    Meds: No orders of the defined types were placed in this encounter.  Labs/procedures today: tdap, pn2  Plan:  Continue routine obstetrical care  Next visit: prefers in person    Reviewed: Preterm labor symptoms and general obstetric precautions including but not limited to vaginal bleeding, contractions, leaking of fluid and fetal movement were reviewed in detail with the patient.  All questions were answered. Has home bp cuff.  Check bp weekly, let us know if >140/90.   Follow-up: Return in about 1 week (around 06/30/2019) for nurse visit rhogam, then 4wks from now for lrob in person w /cnm.  Orders Placed This Encounter  Procedures  . Tdap vaccine greater than or equal to 7yo IM  . POC Urinalysis Dipstick OB   Roma Schanz CNM, [facility] 06/23/2019 9:56 AM

## 2019-06-24 ENCOUNTER — Encounter: Payer: Self-pay | Admitting: Women's Health

## 2019-06-24 DIAGNOSIS — O24419 Gestational diabetes mellitus in pregnancy, unspecified control: Secondary | ICD-10-CM | POA: Insufficient documentation

## 2019-06-24 DIAGNOSIS — Z8632 Personal history of gestational diabetes: Secondary | ICD-10-CM | POA: Insufficient documentation

## 2019-06-24 LAB — CBC
Hematocrit: 36.6 % (ref 34.0–46.6)
Hemoglobin: 12.1 g/dL (ref 11.1–15.9)
MCH: 29.8 pg (ref 26.6–33.0)
MCHC: 33.1 g/dL (ref 31.5–35.7)
MCV: 90 fL (ref 79–97)
Platelets: 218 10*3/uL (ref 150–450)
RBC: 4.06 x10E6/uL (ref 3.77–5.28)
RDW: 12.8 % (ref 11.7–15.4)
WBC: 11.6 10*3/uL — ABNORMAL HIGH (ref 3.4–10.8)

## 2019-06-24 LAB — RPR: RPR Ser Ql: NONREACTIVE

## 2019-06-24 LAB — GLUCOSE TOLERANCE, 2 HOURS W/ 1HR
Glucose, 1 hour: 182 mg/dL — ABNORMAL HIGH (ref 65–179)
Glucose, 2 hour: 122 mg/dL (ref 65–152)
Glucose, Fasting: 122 mg/dL — ABNORMAL HIGH (ref 65–91)

## 2019-06-24 LAB — HIV ANTIBODY (ROUTINE TESTING W REFLEX): HIV Screen 4th Generation wRfx: NONREACTIVE

## 2019-06-24 LAB — ANTIBODY SCREEN: Antibody Screen: NEGATIVE

## 2019-06-25 ENCOUNTER — Encounter: Payer: Self-pay | Admitting: *Deleted

## 2019-06-25 ENCOUNTER — Other Ambulatory Visit: Payer: Self-pay | Admitting: *Deleted

## 2019-06-25 DIAGNOSIS — O24410 Gestational diabetes mellitus in pregnancy, diet controlled: Secondary | ICD-10-CM

## 2019-06-25 DIAGNOSIS — O0990 Supervision of high risk pregnancy, unspecified, unspecified trimester: Secondary | ICD-10-CM

## 2019-06-25 MED ORDER — ACCU-CHEK GUIDE ME W/DEVICE KIT: 1.0000 | PACK | Freq: Four times a day (QID) | 0 refills | Status: DC

## 2019-06-25 MED ORDER — ACCU-CHEK GUIDE VI STRP: ORAL_STRIP | 12 refills | Status: DC

## 2019-06-25 MED ORDER — ACCU-CHEK SOFTCLIX LANCETS MISC: 12 refills | Status: DC

## 2019-06-30 ENCOUNTER — Ambulatory Visit (INDEPENDENT_AMBULATORY_CARE_PROVIDER_SITE_OTHER): Payer: Medicaid Other

## 2019-06-30 ENCOUNTER — Other Ambulatory Visit: Payer: Self-pay

## 2019-06-30 VITALS — BP 119/77 | HR 106 | Ht 61.0 in | Wt 229.4 lb

## 2019-06-30 DIAGNOSIS — Z3A27 27 weeks gestation of pregnancy: Secondary | ICD-10-CM | POA: Diagnosis not present

## 2019-06-30 DIAGNOSIS — Z331 Pregnant state, incidental: Secondary | ICD-10-CM

## 2019-06-30 DIAGNOSIS — Z2913 Encounter for prophylactic Rho(D) immune globulin: Secondary | ICD-10-CM | POA: Diagnosis not present

## 2019-06-30 DIAGNOSIS — Z6791 Unspecified blood type, Rh negative: Secondary | ICD-10-CM

## 2019-06-30 DIAGNOSIS — O26892 Other specified pregnancy related conditions, second trimester: Secondary | ICD-10-CM

## 2019-06-30 DIAGNOSIS — Z1389 Encounter for screening for other disorder: Secondary | ICD-10-CM

## 2019-06-30 DIAGNOSIS — O0990 Supervision of high risk pregnancy, unspecified, unspecified trimester: Secondary | ICD-10-CM

## 2019-06-30 LAB — POCT URINALYSIS DIPSTICK OB
Blood, UA: NEGATIVE
Glucose, UA: NEGATIVE
Ketones, UA: NEGATIVE
Leukocytes, UA: NEGATIVE
Nitrite, UA: NEGATIVE

## 2019-06-30 MED ORDER — RHO D IMMUNE GLOBULIN 1500 UNIT/2ML IJ SOSY: 300.0000 ug | PREFILLED_SYRINGE | Freq: Once | INTRAMUSCULAR | Status: DC

## 2019-06-30 NOTE — Progress Notes (Signed)
   NURSE VISIT- INJECTION  SUBJECTIVE:  Julia Blanchard is a 29 y.o. G63P003 female here for Rhogam injection for She rh negative. [redacted] weeks pregnant  OBJECTIVE:  BP 119/77   Pulse (!) 106   Ht 5\' 1"  (1.549 m)   Wt 229 lb 6.4 oz (104.1 kg)   [name] 12/19/2018 (Exact Date)   BMI 43.34 kg/m   Appears well, in no apparent distress  Injection administered in: {Injection site: RT per quad gluteus    ASSESSMENT:   PLAN: Follow-up as schedule for [facility] Visit 07-21-19 [facility]  06/30/2019 10:42 AM

## 2019-07-08 ENCOUNTER — Other Ambulatory Visit: Payer: Self-pay

## 2019-07-08 ENCOUNTER — Encounter: Payer: Medicaid Other | Attending: Obstetrics and Gynecology | Admitting: Registered"

## 2019-07-08 DIAGNOSIS — O24410 Gestational diabetes mellitus in pregnancy, diet controlled: Secondary | ICD-10-CM | POA: Diagnosis not present

## 2019-07-09 ENCOUNTER — Encounter: Payer: Self-pay | Admitting: Registered"

## 2019-07-09 NOTE — Progress Notes (Signed)
Patient was seen on 07/08/19 for Gestational Diabetes self-management class at [facility]. The following learning objectives were met by the patient during this course:   States the definition of Gestational Diabetes  States why dietary management is important in controlling blood glucose  Describes the effects each nutrient has on blood glucose levels  Demonstrates ability to create a balanced meal plan  Demonstrates carbohydrate counting   States when to check blood glucose levels  Demonstrates proper blood glucose monitoring techniques  States the effect of stress and exercise on blood glucose levels  States the importance of limiting caffeine and abstaining from alcohol and smoking  Blood glucose monitor given: none. Patient has meter and is checking prior to class  Patient instructed to monitor glucose levels: FBS: 60 - <95; 1 hour: <140; 2 hour: <120  Patient received handouts:  Nutrition Diabetes and Pregnancy, including carb counting list  Patient will be seen for follow-up as needed.

## 2019-07-21 ENCOUNTER — Encounter: Payer: Self-pay | Admitting: Women's Health

## 2019-07-21 ENCOUNTER — Other Ambulatory Visit: Payer: Self-pay

## 2019-07-21 ENCOUNTER — Ambulatory Visit (INDEPENDENT_AMBULATORY_CARE_PROVIDER_SITE_OTHER): Payer: Medicaid Other | Admitting: Women's Health

## 2019-07-21 VITALS — BP 131/82 | HR 94 | Wt 233.4 lb

## 2019-07-21 DIAGNOSIS — Z3A30 30 weeks gestation of pregnancy: Secondary | ICD-10-CM

## 2019-07-21 DIAGNOSIS — Z1389 Encounter for screening for other disorder: Secondary | ICD-10-CM

## 2019-07-21 DIAGNOSIS — O26899 Other specified pregnancy related conditions, unspecified trimester: Secondary | ICD-10-CM

## 2019-07-21 DIAGNOSIS — O0990 Supervision of high risk pregnancy, unspecified, unspecified trimester: Secondary | ICD-10-CM

## 2019-07-21 DIAGNOSIS — O24419 Gestational diabetes mellitus in pregnancy, unspecified control: Secondary | ICD-10-CM

## 2019-07-21 DIAGNOSIS — Z331 Pregnant state, incidental: Secondary | ICD-10-CM

## 2019-07-21 DIAGNOSIS — O0993 Supervision of high risk pregnancy, unspecified, third trimester: Secondary | ICD-10-CM

## 2019-07-21 DIAGNOSIS — Z6791 Unspecified blood type, Rh negative: Secondary | ICD-10-CM

## 2019-07-21 DIAGNOSIS — O24415 Gestational diabetes mellitus in pregnancy, controlled by oral hypoglycemic drugs: Secondary | ICD-10-CM

## 2019-07-21 LAB — POCT URINALYSIS DIPSTICK OB
Blood, UA: NEGATIVE
Ketones, UA: NEGATIVE
Leukocytes, UA: NEGATIVE
Nitrite, UA: NEGATIVE
POC,PROTEIN,UA: NEGATIVE

## 2019-07-21 MED ORDER — METFORMIN HCL 500 MG PO TABS: 500.0000 mg | ORAL_TABLET | Freq: Every day | ORAL | 6 refills | Status: DC

## 2019-07-21 NOTE — Progress Notes (Signed)
HIGH-RISK PREGNANCY VISIT Patient name: Mageline Och MRN [id number]  Date of birth: 10/14/1990 Chief Complaint:   High Risk Gestation (? braxton hicks contractions 2 nights ago; both hands go numb)  History of Present Illness:   Shena Marchesi is a 29 y.o. G32P0030 female at [redacted]w[redacted]d with an Estimated Date of Delivery: 09/25/19 being seen today for ongoing management of a high-risk pregnancy complicated by diabetes mellitus A2- meds added today .  Today she reports FBS 87-117 (most >95), 2hr pp all normal since seeing dietician.   Contractions: Not present. Vag. Bleeding: None.  Movement: Present. denies leaking of fluid.  Review of Systems:   Pertinent items are noted in HPI Denies abnormal vaginal discharge w/ itching/odor/irritation, headaches, visual changes, shortness of breath, chest pain, abdominal pain, severe nausea/vomiting, or problems with urination or bowel movements unless otherwise stated above. Pertinent History Reviewed:  Reviewed past medical,surgical, social, obstetrical and family history.  Reviewed problem list, medications and allergies. Physical Assessment:   Vitals:   07/21/19 0941  BP: 131/82  Pulse: 94  Weight: 233 lb 6.4 oz (105.9 kg)  Body mass index is 44.1 kg/m.           Physical Examination:   General appearance: alert, well appearing, and in no distress  Mental status: alert, oriented to person, place, and time  Skin: warm & dry   Extremities: Edema: Trace    Cardiovascular: normal heart rate noted  Respiratory: normal respiratory effort, no distress  Abdomen: gravid, soft, non-tender  Pelvic: Cervical exam deferred         Fetal Status: Fetal Heart Rate (bpm): 153 Fundal Height: 31 cm Movement: Present    Fetal Surveillance Testing today: doppler   Chaperone: n/a    Results for orders placed or performed in visit on 07/21/19 (from the past 24 hour(s))  POC Urinalysis Dipstick OB   Collection Time: 07/21/19  9:42 AM  Result Value Ref  Range   Color, UA     Clarity, UA     Glucose, UA Trace (A) Negative   Bilirubin, UA     Ketones, UA neg    Spec Grav, UA     Blood, UA neg    pH, UA     POC,PROTEIN,UA Negative Negative, Trace, Small (1+), Moderate (2+), Large (3+), 4+   Urobilinogen, UA     Nitrite, UA neg    Leukocytes, UA Negative Negative   Appearance     Odor      Assessment & Plan:  1) High-risk pregnancy G4P0030 at [redacted]w[redacted]d with an Estimated Date of Delivery: 09/25/19   2) A2DM, unstable, start metformin 500mg  hs  Meds:  Meds ordered this encounter  Medications  . metFORMIN (GLUCOPHAGE) 500 MG tablet    Sig: Take 1 tablet (500 mg total) by mouth at bedtime.    Dispense:  30 tablet    Refill:  6    Order Specific Question:   Supervising Provider    Answer:   Florian Buff [[id number]]    Labs/procedures today: none  Treatment Plan:  Growth u/s @ 32, 36wks     2x/wk testing @ 32wks or weekly BPP    Deliver @ 39wks:______   Reviewed: Preterm labor symptoms and general obstetric precautions including but not limited to vaginal bleeding, contractions, leaking of fluid and fetal movement were reviewed in detail with the patient.  All questions were answered. Has home bp cuff.  Check bp weekly, let us know if >140/90.  Follow-up: Return in about 10 days (around 07/31/2019) for HROB, US:EFW, US:BPP, in person, MD or CNM, thenTues nst/rn, Fri hrob/nst until 6/8 w/ bpp/efw 5/21 .  Orders Placed This Encounter  Procedures  . US OB Follow Up  . US FETAL BPP WO NON STRESS  . POC Urinalysis Dipstick OB   Roma Schanz CNM, [facility] 07/21/2019 10:22 AM

## 2019-07-21 NOTE — Patient Instructions (Signed)
Julia Blanchard, I greatly value your feedback.  If you receive a survey following your visit with [place] today, we appreciate you taking the time to fill it out.  Thanks, Knute Neu, CNM, WHNP-BC   [facility] [place] at [facility] ([place], [place] [address]) Entrance C, located off of [address] parking  Go to [email or web address] to register for FREE online childbirth classes   Call the office [phone]) or go to [facility] if:  You begin to have strong, frequent contractions  Your water breaks.  Sometimes it is a big gush of fluid, sometimes it is just a trickle that keeps getting your panties wet or running down your legs  You have vaginal bleeding.  It is normal to have a small amount of spotting if your cervix was checked.   You don't feel your baby moving like normal.  If you don't, get you something to eat and drink and lay down and focus on feeling your baby move.  You should feel at least 10 movements in 2 hours.  If you don't, you should call the office or go to [facility].    Tdap Vaccine  It is recommended that you get the Tdap vaccine during the third trimester of EACH pregnancy to help protect your baby from getting pertussis (whooping cough)  27-36 weeks is the BEST time to do this so that you can pass the protection on to your baby. During pregnancy is better than after pregnancy, but if you are unable to get it during pregnancy it will be offered at the hospital.   You can get this vaccine with [place], at the health department, your family doctor, or some local pharmacies  Everyone who will be around your baby should also be up-to-date on their vaccines before the baby comes. Adults (who are not pregnant) only need 1 dose of Tdap during adulthood.   [place] Pediatricians/Family Doctors:  [facility] [phone]                 [place]  [phone] (usually not accepting new patients unless you have family there already, you are always welcome to call and ask)       [facility] Department [phone]       [facility] Pediatricians/Family Doctors:   [facility]: [phone]  [facility]: [phone]  [facility]: [facility] Doctors:   [facility]: [facility]: [place]:  [place]: [phone]   Home Blood Pressure Monitoring for Patients   Your provider has recommended that you check your blood pressure (BP) at least once a week at home. If you do not have a blood pressure cuff at home, one will be provided for you. Contact your provider if you have not received your monitor within 1 week.   Helpful Tips for Accurate Home Blood Pressure Checks  . Don't smoke, exercise, or drink caffeine 30 minutes before checking your BP . Use the restroom before checking your BP (a full bladder can raise your pressure) . Relax in a comfortable upright chair . Feet on the ground . Left arm resting comfortably on a flat surface at the level of your heart . Legs uncrossed . Back supported . Sit quietly and don't talk . Place the cuff on your bare  arm . Adjust snuggly, so that only two fingertips can fit between your skin and the top of the cuff . Check 2 readings separated by at least one minute . Keep a log of your BP readings . For a visual, please reference this diagram: http://ccnc.care/bpdiagram  Provider Name: [facility]     Phone: [phone]  Zone 1: ALL CLEAR  Continue to monitor your symptoms:  . BP reading is less than 140 (top number) or less than 90 (bottom number)  . No right upper stomach pain . No headaches or seeing spots . No feeling nauseated or throwing up . No swelling in face and hands  Zone 2: CAUTION Call your  doctor's office for any of the following:  . BP reading is greater than 140 (top number) or greater than 90 (bottom number)  . Stomach pain under your ribs in the middle or right side . Headaches or seeing spots . Feeling nauseated or throwing up . Swelling in face and hands  Zone 3: EMERGENCY  Seek immediate medical care if you have any of the following:  . BP reading is greater than160 (top number) or greater than 110 (bottom number) . Severe headaches not improving with Tylenol . Serious difficulty catching your breath . Any worsening symptoms from Zone 2   Third Trimester of Pregnancy The third trimester is from week 29 through week 42, months 7 through 9. The third trimester is a time when the fetus is growing rapidly. At the end of the ninth month, the fetus is about 20 inches in length and weighs 6-10 pounds.  BODY CHANGES Your body goes through many changes during pregnancy. The changes vary from woman to woman.   Your weight will continue to increase. You can expect to gain 25-35 pounds (11-16 kg) by the end of the pregnancy.  You may begin to get stretch marks on your hips, abdomen, and breasts.  You may urinate more often because the fetus is moving lower into your pelvis and pressing on your bladder.  You may develop or continue to have heartburn as a result of your pregnancy.  You may develop constipation because certain hormones are causing the muscles that push waste through your intestines to slow down.  You may develop hemorrhoids or swollen, bulging veins (varicose veins).  You may have pelvic pain because of the weight gain and pregnancy hormones relaxing your joints between the bones in your pelvis. Backaches may result from overexertion of the muscles supporting your posture.  You may have changes in your hair. These can include thickening of your hair, rapid growth, and changes in texture. Some women also have hair loss during or after pregnancy, or hair that  feels dry or thin. Your hair will most likely return to normal after your baby is born.  Your breasts will continue to grow and be tender. A yellow discharge may leak from your breasts called colostrum.  Your belly button may stick out.  You may feel short of breath because of your expanding uterus.  You may notice the fetus "dropping," or moving lower in your abdomen.  You may have a bloody mucus discharge. This usually occurs a few days to a week before labor begins.  Your cervix becomes thin and soft (effaced) near your due date. WHAT TO EXPECT AT YOUR PRENATAL EXAMS  You will have prenatal exams every 2 weeks until week 36. Then, you will have weekly prenatal exams. During a routine prenatal visit:  You  will be weighed to make sure you and the fetus are growing normally.  Your blood pressure is taken.  Your abdomen will be measured to track your baby's growth.  The fetal heartbeat will be listened to.  Any test results from the previous visit will be discussed.  You may have a cervical check near your due date to see if you have effaced. At around 36 weeks, your caregiver will check your cervix. At the same time, your caregiver will also perform a test on the secretions of the vaginal tissue. This test is to determine if a type of bacteria, Group B streptococcus, is present. Your caregiver will explain this further. Your caregiver may ask you:  What your birth plan is.  How you are feeling.  If you are feeling the baby move.  If you have had any abnormal symptoms, such as leaking fluid, bleeding, severe headaches, or abdominal cramping.  If you have any questions. Other tests or screenings that may be performed during your third trimester include:  Blood tests that check for low iron levels (anemia).  Fetal testing to check the health, activity level, and growth of the fetus. Testing is done if you have certain medical conditions or if there are problems during the  pregnancy. FALSE LABOR You may feel small, irregular contractions that eventually go away. These are called Braxton Hicks contractions, or false labor. Contractions may last for hours, days, or even weeks before true labor sets in. If contractions come at regular intervals, intensify, or become painful, it is best to be seen by your caregiver.  SIGNS OF LABOR   Menstrual-like cramps.  Contractions that are 5 minutes apart or less.  Contractions that start on the top of the uterus and spread down to the lower abdomen and back.  A sense of increased pelvic pressure or back pain.  A watery or bloody mucus discharge that comes from the vagina. If you have any of these signs before the 37th week of pregnancy, call your caregiver right away. You need to go to the hospital to get checked immediately. HOME CARE INSTRUCTIONS   Avoid all smoking, herbs, alcohol, and unprescribed drugs. These chemicals affect the formation and growth of the baby.  Follow your caregiver's instructions regarding medicine use. There are medicines that are either safe or unsafe to take during pregnancy.  Exercise only as directed by your caregiver. Experiencing uterine cramps is a good sign to stop exercising.  Continue to eat regular, healthy meals.  Wear a good support bra for breast tenderness.  Do not use hot tubs, steam rooms, or saunas.  Wear your seat belt at all times when driving.  Avoid raw meat, uncooked cheese, cat litter boxes, and soil used by cats. These carry germs that can cause birth defects in the baby.  Take your prenatal vitamins.  Try taking a stool softener (if your caregiver approves) if you develop constipation. Eat more high-fiber foods, such as fresh vegetables or fruit and whole grains. Drink plenty of fluids to keep your urine clear or pale yellow.  Take warm sitz baths to soothe any pain or discomfort caused by hemorrhoids. Use hemorrhoid cream if your caregiver approves.  If you  develop varicose veins, wear support hose. Elevate your feet for 15 minutes, 3-4 times a day. Limit salt in your diet.  Avoid heavy lifting, wear low heal shoes, and practice good posture.  Rest a lot with your legs elevated if you have leg cramps or low back  pain.  Visit your dentist if you have not gone during your pregnancy. Use a soft toothbrush to brush your teeth and be gentle when you floss.  A sexual relationship may be continued unless your caregiver directs you otherwise.  Do not travel far distances unless it is absolutely necessary and only with the approval of your caregiver.  Take prenatal classes to understand, practice, and ask questions about the labor and delivery.  Make a trial run to the hospital.  Pack your hospital bag.  Prepare the baby's nursery.  Continue to go to all your prenatal visits as directed by your caregiver. SEEK MEDICAL CARE IF:  You are unsure if you are in labor or if your water has broken.  You have dizziness.  You have mild pelvic cramps, pelvic pressure, or nagging pain in your abdominal area.  You have persistent nausea, vomiting, or diarrhea.  You have a bad smelling vaginal discharge.  You have pain with urination. SEEK IMMEDIATE MEDICAL CARE IF:   You have a fever.  You are leaking fluid from your vagina.  You have spotting or bleeding from your vagina.  You have severe abdominal cramping or pain.  You have rapid weight loss or gain.  You have shortness of breath with chest pain.  You notice sudden or extreme swelling of your face, hands, ankles, feet, or legs.  You have not felt your baby move in over an hour.  You have severe headaches that do not go away with medicine.  You have vision changes. Document Released: 03/20/2001 Document Revised: 03/31/2013 Document Reviewed: 05/27/2012 [facility] Patient Information 2015 [place], [place]. This information is not intended to replace advice given to you by your health  care provider. Make sure you discuss any questions you have with your health care provider.  PROTECT YOURSELF & YOUR BABY FROM THE FLU! Because you are pregnant, we at [facility], along with the Centers for Disease Control (CDC), recommend that you receive the flu vaccine to protect yourself and your baby from the flu. The flu is more likely to cause severe illness in pregnant women than in women of reproductive age who are not pregnant. Changes in the immune system, heart, and lungs during pregnancy make pregnant women (and women up to two weeks postpartum) more prone to severe illness from flu, including illness resulting in hospitalization. Flu also may be harmful for a pregnant woman's developing baby. A common flu symptom is fever, which may be associated with neural tube defects and other adverse outcomes for a developing baby. Getting vaccinated can also help protect a baby after birth from flu. (Mom passes antibodies onto the developing baby during her pregnancy.)  A Flu Vaccine is the Best Protection Against Flu Getting a flu vaccine is the first and most important step in protecting against flu. Pregnant women should get a flu shot and not the live attenuated influenza vaccine (LAIV), also known as nasal spray flu vaccine. Flu vaccines given during pregnancy help protect both the mother and her baby from flu. Vaccination has been shown to reduce the risk of flu-associated acute respiratory infection in pregnant women by up to one-half. A 2018 study showed that getting a flu shot reduced a pregnant woman's risk of being hospitalized with flu by an average of 40 percent. Pregnant women who get a flu vaccine are also helping to protect their babies from flu illness for the first several months after their birth, when they are too young to get vaccinated.  A Long Record of Safety for Flu Shots in Pregnant Women Flu shots have been given to millions of pregnant women over many years with a good  safety record. There is a lot of evidence that flu vaccines can be given safely during pregnancy; though these data are limited for the first trimester. The CDC recommends that pregnant women get vaccinated during any trimester of their pregnancy. It is very important for pregnant women to get the flu shot.   Other Preventive Actions In addition to getting a flu shot, pregnant women should take the same everyday preventive actions the CDC recommends of everyone, including covering coughs, washing hands often, and avoiding people who are sick.  Symptoms and Treatment If you get sick with flu symptoms call your doctor right away. There are antiviral drugs that can treat flu illness and prevent serious flu complications. The CDC recommends prompt treatment for people who have influenza infection or suspected influenza infection and who are at high risk of serious flu complications, such as people with asthma, diabetes (including gestational diabetes), or heart disease. Early treatment of influenza in hospitalized pregnant women has been shown to reduce the length of the hospital stay.  Symptoms Flu symptoms include fever, cough, sore throat, runny or stuffy nose, body aches, headache, chills and fatigue. Some people may also have vomiting and diarrhea. People may be infected with the flu and have respiratory symptoms without a fever.  Early Treatment is Important for Pregnant Women Treatment should begin as soon as possible because antiviral drugs work best when started early (within 48 hours after symptoms start). Antiviral drugs can make your flu illness milder and make you feel better faster. They may also prevent serious health problems that can result from flu illness. Oral oseltamivir (Tamiflu) is the preferred treatment for pregnant women because it has the most studies available to suggest that it is safe and beneficial. Antiviral drugs require a prescription from your provider. Having a fever  caused by flu infection or other infections early in pregnancy may be linked to birth defects in a baby. In addition to taking antiviral drugs, pregnant women who get a fever should treat their fever with Tylenol (acetaminophen) and contact their provider immediately.  When to [place] If you are pregnant and have any of these signs, seek care immediately:  Difficulty breathing or shortness of breath  Pain or pressure in the chest or abdomen  Sudden dizziness  Confusion  Severe or persistent vomiting  High fever that is not responding to Tylenol (or store brand equivalent)  Decreased or no movement of your baby  [email or web address]   Gestational Diabetes Mellitus, Diagnosis Gestational diabetes (gestational diabetes mellitus) is a short-term (temporary) form of diabetes that can happen during pregnancy. It goes away after you give birth. It may be caused by one or both of these problems:  Your pancreas does not make enough of a hormone called insulin.  Your body does not respond in a normal way to insulin that it makes. Insulin lets sugars (glucose) go into cells in the body. This gives you energy. If you have diabetes, sugars cannot get into cells. This causes high blood sugar (hyperglycemia). If you get gestational diabetes, you are:  More likely to get it if you get pregnant again.  More likely to develop type 2 diabetes in the future. If gestational diabetes is treated, it may not hurt you or your baby. Your doctor will set treatment goals for you. In general, you  should have these blood sugar levels:  After not eating for a long time (fasting): 95 mg/dL (5.3 mmol/L).  After meals (postprandial): ? One hour after a meal: at or below 140 mg/dL (7.8 mmol/L). ? Two hours after a meal: at or below 120 mg/dL (6.7 mmol/L).  A1c (hemoglobin A1c) level: 6-6.5%. Follow these instructions at home: Questions to ask your doctor    You may want to ask these questions: ? Do I need to meet with a diabetes educator? ? What equipment will I need to care for myself at home? ? What medicines do I need? When should I take them? ? How often do I need to check my blood sugar? ? What number can I call if I have questions? ? When is my next doctor's visit? General instructions  Take over-the-counter and prescription medicines only as told by your doctor.  Stay at a healthy weight during pregnancy.  Keep all follow-up visits as told by your doctor. This is important. Contact a doctor if:  Your blood sugar is at or above 240 mg/dL (13.3 mmol/L).  Your blood sugar is at or above 200 mg/dL (11.1 mmol/L) and you have ketones in your pee (urine).  You have been sick or have had a fever for 2 days or more and you are not getting better.  You have any of these problems for more than 6 hours: ? You cannot eat or drink. ? You feel sick to your stomach (nauseous). ? You throw up (vomit). ? You have watery poop (diarrhea). Get help right away if:  Your blood sugar is lower than 54 mg/dL (3 mmol/L).  You get confused.  You have trouble: ? Thinking clearly. ? Breathing.  Your baby moves less than normal.  You have any of these: ? Moderate or large ketone levels in your pee. ? Blood coming from your vagina. ? Unusual fluid coming from your vagina. ? Early contractions. These may feel like tightness in your belly. Summary  Gestational diabetes is a short-term form of diabetes. It can happen while you are pregnant. It goes away after you give birth.  If gestational diabetes is treated, it may not hurt you or your baby. Your doctor will set treatment goals for you.  Keep all follow-up visits as told by your doctor. This is important. This information is not intended to replace advice given to you by your health care provider. Make sure you discuss any questions you have with your health care provider. Document  Revised: 05/02/2017 Document Reviewed: 04/29/2015 Elsevier Patient Education  2020 [employer].

## 2019-07-31 ENCOUNTER — Ambulatory Visit (INDEPENDENT_AMBULATORY_CARE_PROVIDER_SITE_OTHER): Payer: Medicaid Other | Admitting: Obstetrics & Gynecology

## 2019-07-31 ENCOUNTER — Other Ambulatory Visit: Payer: Self-pay

## 2019-07-31 ENCOUNTER — Ambulatory Visit (INDEPENDENT_AMBULATORY_CARE_PROVIDER_SITE_OTHER): Payer: Medicaid Other

## 2019-07-31 ENCOUNTER — Encounter: Payer: Self-pay | Admitting: Obstetrics & Gynecology

## 2019-07-31 VITALS — BP 127/82 | HR 87 | Wt 233.0 lb

## 2019-07-31 DIAGNOSIS — O0993 Supervision of high risk pregnancy, unspecified, third trimester: Secondary | ICD-10-CM

## 2019-07-31 DIAGNOSIS — O0990 Supervision of high risk pregnancy, unspecified, unspecified trimester: Secondary | ICD-10-CM

## 2019-07-31 DIAGNOSIS — N390 Urinary tract infection, site not specified: Secondary | ICD-10-CM

## 2019-07-31 DIAGNOSIS — O24415 Gestational diabetes mellitus in pregnancy, controlled by oral hypoglycemic drugs: Secondary | ICD-10-CM

## 2019-07-31 DIAGNOSIS — O3663X0 Maternal care for excessive fetal growth, third trimester, not applicable or unspecified: Secondary | ICD-10-CM | POA: Diagnosis not present

## 2019-07-31 DIAGNOSIS — Z3A32 32 weeks gestation of pregnancy: Secondary | ICD-10-CM | POA: Diagnosis not present

## 2019-07-31 DIAGNOSIS — Z1389 Encounter for screening for other disorder: Secondary | ICD-10-CM

## 2019-07-31 DIAGNOSIS — O24419 Gestational diabetes mellitus in pregnancy, unspecified control: Secondary | ICD-10-CM

## 2019-07-31 DIAGNOSIS — Z331 Pregnant state, incidental: Secondary | ICD-10-CM

## 2019-07-31 LAB — POCT URINALYSIS DIPSTICK OB
Blood, UA: NEGATIVE
Glucose, UA: NEGATIVE
Ketones, UA: NEGATIVE
Leukocytes, UA: NEGATIVE
Nitrite, UA: POSITIVE
POC,PROTEIN,UA: NEGATIVE

## 2019-07-31 MED ORDER — NITROFURANTOIN MONOHYD MACRO 100 MG PO CAPS: 100.0000 mg | ORAL_CAPSULE | Freq: Two times a day (BID) | ORAL | 0 refills | Status: DC

## 2019-07-31 NOTE — Progress Notes (Signed)
HIGH-RISK PREGNANCY VISIT Patient name: Julia Blanchard MRN [id number]  Date of birth: [birth year] Chief Complaint:   Routine Prenatal Visit (Ultrasound)  History of Present Illness:   Julia Blanchard is a 29 y.o. G22P0030 female at [redacted]w[redacted]d with an Estimated Date of Delivery: 09/25/19 being seen today for ongoing management of a high-risk pregnancy complicated by Class A2 DM of metformin 500 qhs.  Today she reports no complaints.  Depression screen [facility] 2/9 07/09/2019 03/17/2019 01/26/2019  Decreased Interest 0 0 0  Down, Depressed, Hopeless 0 0 0  PHQ - 2 Score 0 0 0    Contractions: Not present. Vag. Bleeding: None.  Movement: Present. denies leaking of fluid.  Review of Systems:   Pertinent items are noted in HPI Denies abnormal vaginal discharge w/ itching/odor/irritation, headaches, visual changes, shortness of breath, chest pain, abdominal pain, severe nausea/vomiting, or problems with urination or bowel movements unless otherwise stated above. Pertinent History Reviewed:  Reviewed past medical,surgical, social, obstetrical and family history.  Reviewed problem list, medications and allergies. Physical Assessment:   Vitals:   07/31/19 1103  BP: 127/82  Pulse: 87  Weight: 233 lb (105.7 kg)  Body mass index is 44.02 kg/m.           Physical Examination:   General appearance: alert, well appearing, and in no distress  Mental status: alert, oriented to person, place, and time  Skin: warm & dry   Extremities: Edema: Trace    Cardiovascular: normal heart rate noted  Respiratory: normal respiratory effort, no distress  Abdomen: gravid, soft, non-tender  Pelvic: Cervical exam deferred         Fetal Status: Fetal Heart Rate (bpm): 166 Fundal Height: 33 cm Movement: Present    Fetal Surveillance Testing today: BPP 8/8   Chaperone: n/a    Results for orders placed or performed in visit on 07/31/19 (from the past 24 hour(s))  POC Urinalysis Dipstick OB   Collection Time:  07/31/19 10:33 AM  Result Value Ref Range   Color, UA     Clarity, UA     Glucose, UA Negative Negative   Bilirubin, UA     Ketones, UA n    Spec Grav, UA     Blood, UA n    pH, UA     POC,PROTEIN,UA Negative Negative, Trace, Small (1+), Moderate (2+), Large (3+), 4+   Urobilinogen, UA     Nitrite, UA positive    Leukocytes, UA Negative Negative   Appearance     Odor      Assessment & Plan:  1) High-risk pregnancy G4P0030 at [redacted]w[redacted]d with an Estimated Date of Delivery: 09/25/19   2) Class A2 DM, stable, metformin 500 qhs with good CBG control  3) +nitrates, culture sent, macrobid e prescribed  Meds:  Meds ordered this encounter  Medications  . nitrofurantoin, macrocrystal-monohydrate, (MACROBID) 100 MG capsule    Sig: Take 1 capsule (100 mg total) by mouth 2 (two) times daily.    Dispense:  14 capsule    Refill:  0    Labs/procedures today:   Treatment Plan:  Twice weekly NST EFW 36-37 weeks  Reviewed: Preterm labor symptoms and general obstetric precautions including but not limited to vaginal bleeding, contractions, leaking of fluid and fetal movement were reviewed in detail with the patient.  All questions were answered. Has home bp cuff. Rx faxed to  Check bp weekly, let us know if >140/90.   Follow-up: Return in about 6 days (around 08/06/2019) for NST,  HROB.  Orders Placed This Encounter  Procedures  . POC Urinalysis Dipstick OB   Florian Buff  07/31/2019 11:34 AM

## 2019-07-31 NOTE — Progress Notes (Signed)
[place] 32 wks,cephalic,BPP [id number] placenta 3,afi 15 cm,fhr 166 bpm,EFW 2349 g 94%,AC 99%

## 2019-07-31 NOTE — Addendum Note (Signed)
Addended by: Armond Hang on: 07/31/2019 11:36 AM   Modules accepted: Orders

## 2019-08-02 LAB — URINE CULTURE

## 2019-08-04 ENCOUNTER — Ambulatory Visit (INDEPENDENT_AMBULATORY_CARE_PROVIDER_SITE_OTHER): Payer: Medicaid Other | Admitting: *Deleted

## 2019-08-04 ENCOUNTER — Other Ambulatory Visit: Payer: Self-pay

## 2019-08-04 VITALS — BP 131/81 | HR 101 | Wt 235.8 lb

## 2019-08-04 DIAGNOSIS — Z1389 Encounter for screening for other disorder: Secondary | ICD-10-CM

## 2019-08-04 DIAGNOSIS — O0990 Supervision of high risk pregnancy, unspecified, unspecified trimester: Secondary | ICD-10-CM

## 2019-08-04 DIAGNOSIS — O24415 Gestational diabetes mellitus in pregnancy, controlled by oral hypoglycemic drugs: Secondary | ICD-10-CM

## 2019-08-04 DIAGNOSIS — Z3A32 32 weeks gestation of pregnancy: Secondary | ICD-10-CM | POA: Diagnosis not present

## 2019-08-04 DIAGNOSIS — O24419 Gestational diabetes mellitus in pregnancy, unspecified control: Secondary | ICD-10-CM

## 2019-08-04 DIAGNOSIS — Z331 Pregnant state, incidental: Secondary | ICD-10-CM

## 2019-08-04 DIAGNOSIS — O0993 Supervision of high risk pregnancy, unspecified, third trimester: Secondary | ICD-10-CM | POA: Diagnosis not present

## 2019-08-04 LAB — POCT URINALYSIS DIPSTICK OB
Blood, UA: NEGATIVE
Glucose, UA: NEGATIVE
Ketones, UA: NEGATIVE
Leukocytes, UA: NEGATIVE
Nitrite, UA: NEGATIVE
POC,PROTEIN,UA: NEGATIVE

## 2019-08-04 NOTE — Progress Notes (Signed)
   NURSE VISIT- NST  SUBJECTIVE:  Julia Blanchard is a 29 y.o. G45P0030 female at [redacted]w[redacted]d, here for a NST for pregnancy complicated by 123456 currently on Metformin.  She reports active fetal movement, contractions: none, vaginal bleeding: none, membranes: intact.   OBJECTIVE:  BP 131/81   Pulse (!) 101   Wt 235 lb 12.8 oz (107 kg)   [name] 12/19/2018 (Exact Date)   BMI 44.55 kg/m   Appears well, no apparent distress  Results for orders placed or performed in visit on 08/04/19 (from the past 24 hour(s))  POC Urinalysis Dipstick OB   Collection Time: 08/04/19 10:19 AM  Result Value Ref Range   Color, UA     Clarity, UA     Glucose, UA Negative Negative   Bilirubin, UA     Ketones, UA n    Spec Grav, UA     Blood, UA n    pH, UA     POC,PROTEIN,UA Negative Negative, Trace, Small (1+), Moderate (2+), Large (3+), 4+   Urobilinogen, UA     Nitrite, UA n    Leukocytes, UA Negative Negative   Appearance     Odor      NST: FHR baseline 140 bpm, Variability: moderate, Accelerations:present, Decelerations:  Absent= Cat 1/Reactive Toco: none   ASSESSMENT: G4P0030 at [redacted]w[redacted]d with A2DM currently on Metformin NST reactive  PLAN: EFM strip reviewed by Knute Neu, CNM, [facility]   Recommendations: keep next appointment as scheduled    Alice Rieger  08/04/2019 12:29 PM

## 2019-08-06 ENCOUNTER — Other Ambulatory Visit: Payer: Self-pay

## 2019-08-06 ENCOUNTER — Ambulatory Visit (INDEPENDENT_AMBULATORY_CARE_PROVIDER_SITE_OTHER): Payer: Medicaid Other | Admitting: Internal Medicine

## 2019-08-06 ENCOUNTER — Encounter (INDEPENDENT_AMBULATORY_CARE_PROVIDER_SITE_OTHER): Payer: Self-pay | Admitting: Internal Medicine

## 2019-08-06 VITALS — BP 132/70 | HR 105 | Resp 18 | Ht 61.0 in | Wt 235.0 lb

## 2019-08-06 DIAGNOSIS — O24419 Gestational diabetes mellitus in pregnancy, unspecified control: Secondary | ICD-10-CM

## 2019-08-06 NOTE — Progress Notes (Signed)
Metrics: Intervention Frequency ACO  Documented Smoking Status Yearly  Screened one or more times in 24 months  Cessation Counseling or  Active cessation medication Past 24 months  Past 24 months   Guideline developer: UpToDate (See UpToDate for funding source) Date Released: 2014       Wellness Office Visit  Subjective:  Patient ID: Julia Blanchard, female    DOB: October 03, 1990  Age: 29 y.o. MRN: [id number]  CC: This nice 29 year old lady comes to practice as a new patient to be established. HPI  She is currently [redacted] weeks pregnant.  She has been diagnosed with gestational diabetes and is on Metformin.  This is being followed by OB/GYN.  She has no other specific complaints at the present time.  She tells me that her prepregnancy weight was around 200 pounds. Past Medical History:  Diagnosis Date  . Asthma       Family History  Problem Relation Age of Onset  . Stroke Paternal Grandfather   . Cancer Paternal Grandmother        brain  . Cancer Maternal Grandfather        pancreatic    Social History   Social History Narrative   Married for 8 months.Lives with husband and step-daughter.Originally from [place].   Social History   Tobacco Use  . Smoking status: Former Research scientist (life sciences)  . Smokeless tobacco: Never Used  Substance Use Topics  . Alcohol use: Never    Current Meds  Medication Sig  . Accu-Chek Softclix Lancets lancets Use as instructed to check blood sugar 4 times daily  . Blood Glucose Monitoring Suppl (ACCU-CHEK GUIDE ME) w/Device KIT 1 each by Does not apply route 4 (four) times daily.  . Blood Pressure Monitor MISC For regular home bp monitoring during pregnancy  . glucose blood (ACCU-CHEK GUIDE) test strip Use as instructed to check blood sugar 4 times daily  . metFORMIN (GLUCOPHAGE) 500 MG tablet Take 1 tablet (500 mg total) by mouth at bedtime.  . nitrofurantoin, macrocrystal-monohydrate, (MACROBID) 100 MG capsule Take 1 capsule (100 mg total) by mouth 2 (two)  times daily.  Marland Kitchen omeprazole (PRILOSEC) 20 MG capsule Take 1 capsule (20 mg total) by mouth daily. 1 tablet a day (Patient taking differently: Take 20 mg by mouth as needed. )  . Prenatal Vit-Fe Fumarate-FA (PRENATAL MULTIVITAMIN) TABS tablet Take 1 tablet by mouth daily at 12 noon.      Objective:   Today's Vitals: BP 132/70 (BP Location: Right Arm, Patient Position: Sitting, Cuff Size: Normal)   Pulse (!) 105   Resp 18   Ht _0  (1.549 m)   Wt 235 lb (106.6 kg)   [name] 12/19/2018 (Exact Date)   SpO2 98%   BMI 44.40 kg/m  Vitals with BMI 08/06/2019 08/04/2019 07/31/2019  Height _1  - -  Weight 235 lbs 235 lbs 13 oz 233 lbs  BMI 33.82 - -  Systolic 505 397 673  Diastolic 70 81 82  Pulse 419 101 87     Physical Exam   She looks systemically well.  Blood pressure is under reasonable control.  Alert and orientated without any obvious focal neurological signs.    Assessment   1. Gestational diabetes mellitus, class A2       Tests ordered No orders of the defined types were placed in this encounter.    Plan: 1. We discussed her increased risk of becoming type II diabetic post pregnancy and we will monitor this. 2. Follow-up in about 3  months when we will see her and do baseline blood work. 3. Today I spent 30 minutes with this patient reviewing her medical records from her OB/GYN and addressing any questions she had.   No orders of the defined types were placed in this encounter.   Doree Albee, MD

## 2019-08-07 ENCOUNTER — Other Ambulatory Visit: Payer: Self-pay

## 2019-08-07 ENCOUNTER — Encounter: Payer: Self-pay | Admitting: Obstetrics & Gynecology

## 2019-08-07 ENCOUNTER — Ambulatory Visit (INDEPENDENT_AMBULATORY_CARE_PROVIDER_SITE_OTHER): Payer: Medicaid Other | Admitting: Obstetrics & Gynecology

## 2019-08-07 VITALS — BP 125/83 | HR 100 | Wt 234.0 lb

## 2019-08-07 DIAGNOSIS — Z1389 Encounter for screening for other disorder: Secondary | ICD-10-CM | POA: Diagnosis not present

## 2019-08-07 DIAGNOSIS — O24419 Gestational diabetes mellitus in pregnancy, unspecified control: Secondary | ICD-10-CM

## 2019-08-07 DIAGNOSIS — O0992 Supervision of high risk pregnancy, unspecified, second trimester: Secondary | ICD-10-CM

## 2019-08-07 DIAGNOSIS — Z3A33 33 weeks gestation of pregnancy: Secondary | ICD-10-CM

## 2019-08-07 DIAGNOSIS — O0990 Supervision of high risk pregnancy, unspecified, unspecified trimester: Secondary | ICD-10-CM

## 2019-08-07 DIAGNOSIS — Z331 Pregnant state, incidental: Secondary | ICD-10-CM

## 2019-08-07 LAB — POCT URINALYSIS DIPSTICK OB
Glucose, UA: NEGATIVE
Ketones, UA: NEGATIVE
Leukocytes, UA: NEGATIVE
Nitrite, UA: NEGATIVE
POC,PROTEIN,UA: NEGATIVE

## 2019-08-07 NOTE — Progress Notes (Signed)
Patient ID: Gentry Mlakar, female   DOB: [birth year], 29 y.o.   MRN: [id number]   [facility] PREGNANCY VISIT Patient name: Julia Blanchard MRN [id number]  Date of birth: 1990/11/09 Chief Complaint:   High Risk Gestation (NST; + cramping; spotting on Wednesday, none since then)  History of Present Illness:   Julia Blanchard is a 29 y.o. G5P0030 female at [redacted]w[redacted]d with an Estimated Date of Delivery: 09/25/19 being seen today for ongoing management of a high-risk pregnancy complicated by Class A2 DM on metformin 500 qhs.  Today she reports no complaints.  Depression screen [facility] 2/9 07/09/2019 03/17/2019 01/26/2019  Decreased Interest 0 0 0  Down, Depressed, Hopeless 0 0 0  PHQ - 2 Score 0 0 0    Contractions: Not present. Vag. Bleeding: None.  Movement: Present. denies leaking of fluid.  Review of Systems:   Pertinent items are noted in HPI Denies abnormal vaginal discharge w/ itching/odor/irritation, headaches, visual changes, shortness of breath, chest pain, abdominal pain, severe nausea/vomiting, or problems with urination or bowel movements unless otherwise stated above. Pertinent History Reviewed:  Reviewed past medical,surgical, social, obstetrical and family history.  Reviewed problem list, medications and allergies. Physical Assessment:   Vitals:   08/07/19 1046  BP: 125/83  Pulse: 100  Weight: 234 lb (106.1 kg)  Body mass index is 44.21 kg/m.           Physical Examination:   General appearance: alert, well appearing, and in no distress  Mental status: alert, oriented to person, place, and time  Skin: warm & dry   Extremities: Edema: Trace    Cardiovascular: normal heart rate noted  Respiratory: normal respiratory effort, no distress  Abdomen: gravid, soft, non-tender  Pelvic: Cervical exam deferred         Fetal Status:     Movement: Present    Fetal Surveillance Testing today: reactive NST  Julia Blanchard is at [redacted]w[redacted]d Estimated Date of Delivery: 09/25/19  NST being  performed due to Class A2 DM  Today the NST is Reactive  Fetal Monitoring:  Baseline: 140 bpm, Variability: Good {> 6 bpm), Accelerations: Reactive and Decelerations: Absent   reactive  The accelerations are >15 bpm and more than 2 in 20 minutes  Final diagnosis:  Reactive NST  Florian Buff, MD      Chaperone: n/a    Results for orders placed or performed in visit on 08/07/19 (from the past 24 hour(s))  POC Urinalysis Dipstick OB   Collection Time: 08/07/19 10:47 AM  Result Value Ref Range   Color, UA     Clarity, UA     Glucose, UA Negative Negative   Bilirubin, UA     Ketones, UA neg    Spec Grav, UA     Blood, UA trace    pH, UA     POC,PROTEIN,UA Negative Negative, Trace, Small (1+), Moderate (2+), Large (3+), 4+   Urobilinogen, UA     Nitrite, UA neg    Leukocytes, UA Negative Negative   Appearance     Odor      Assessment & Plan:  1) High-risk pregnancy G4P0030 at [redacted]w[redacted]d with an Estimated Date of Delivery: 09/25/19   2) Class A2 DM, stable, her only problem are fastings and they are much better on the hs metformin 500 will increase to 1000 if needed at this point not needed  3) LGA on sonogram,   Meds: No orders of the defined types were placed in this encounter.   Labs/procedures  today:   Treatment Plan:  Twice weekly testing efw 36-37 weeks   Reviewed: Preterm labor symptoms and general obstetric precautions including but not limited to vaginal bleeding, contractions, leaking of fluid and fetal movement were reviewed in detail with the patient.  All questions were answered. Has home bp cuff. Rx faxed to . Check bp weekly, let us know if >140/90.   Follow-up: Return in about 4 days (around 08/11/2019) for NST, HROB.  Orders Placed This Encounter  Procedures  . POC Urinalysis Dipstick OB   Florian Buff  08/07/2019 12:07 PM

## 2019-08-11 ENCOUNTER — Ambulatory Visit (INDEPENDENT_AMBULATORY_CARE_PROVIDER_SITE_OTHER): Payer: Medicaid Other | Admitting: *Deleted

## 2019-08-11 ENCOUNTER — Other Ambulatory Visit: Payer: Self-pay

## 2019-08-11 VITALS — BP 132/77 | HR 87 | Wt 237.0 lb

## 2019-08-11 DIAGNOSIS — O24419 Gestational diabetes mellitus in pregnancy, unspecified control: Secondary | ICD-10-CM

## 2019-08-11 DIAGNOSIS — Z3A33 33 weeks gestation of pregnancy: Secondary | ICD-10-CM

## 2019-08-11 DIAGNOSIS — Z331 Pregnant state, incidental: Secondary | ICD-10-CM

## 2019-08-11 DIAGNOSIS — O0990 Supervision of high risk pregnancy, unspecified, unspecified trimester: Secondary | ICD-10-CM

## 2019-08-11 DIAGNOSIS — Z1389 Encounter for screening for other disorder: Secondary | ICD-10-CM

## 2019-08-11 LAB — POCT URINALYSIS DIPSTICK OB
Blood, UA: NEGATIVE
Glucose, UA: NEGATIVE
Ketones, UA: NEGATIVE
Leukocytes, UA: NEGATIVE
Nitrite, UA: NEGATIVE
POC,PROTEIN,UA: NEGATIVE

## 2019-08-11 NOTE — Progress Notes (Signed)
   NURSE VISIT- NST  SUBJECTIVE:  Julia Blanchard is a 29 y.o. G32P0030 female at [redacted]w[redacted]d, here for a NST for pregnancy complicated by 123456 currently on Metformin.  She reports active fetal movement, contractions: none, vaginal bleeding: none, membranes: intact.   OBJECTIVE:  BP 132/77   Pulse 87   Wt 237 lb (107.5 kg)   [name] 12/19/2018 (Exact Date)   BMI 44.78 kg/m   Appears well, no apparent distress  No results found for this or any previous visit (from the past 24 hour(s)).  NST: FHR baseline 140 bpm, Variability: moderate, Accelerations:present, Decelerations:  Absent= Cat 1/Reactive Toco: none   ASSESSMENT: G4P0030 at [redacted]w[redacted]d with A2DM currently on Metformin NST reactive  PLAN: EFM strip reviewed by Dr. Elonda Husky   Recommendations: keep next appointment as scheduled    Alice Rieger  08/11/2019 12:37 PM

## 2019-08-13 ENCOUNTER — Encounter: Payer: Self-pay | Admitting: *Deleted

## 2019-08-13 NOTE — Progress Notes (Signed)
Patient ID: Julia Blanchard, female   DOB: 10-Dec-1990, 29 y.o.   MRN: [id number]    [facility] PREGNANCY VISIT Patient name: Julia Blanchard MRN [id number]  Date of birth: July 30, 1990 Chief Complaint:   No chief complaint on file.  History of Present Illness:   Julia Blanchard is a 30 y.o. G63P0030 female at [redacted]w[redacted]d with an Estimated Date of Delivery: 09/25/19 being seen today for ongoing management of a high-risk pregnancy complicated by Class A2 DM on Metformin 500 qhs.  Today she reports baby very active, no concerns. Baby growing at 78 %ile Depression screen [facility] 2/9 07/09/2019 03/17/2019 01/26/2019  Decreased Interest 0 0 0  Down, Depressed, Hopeless 0 0 0  PHQ - 2 Score 0 0 0     .  .   . denies leaking of fluid.  Review of Systems:   Pertinent items are noted in HPI Denies abnormal vaginal discharge w/ itching/odor/irritation, headaches, visual changes, shortness of breath, chest pain, abdominal pain, severe nausea/vomiting, or problems with urination or bowel movements unless otherwise stated above. Pertinent History Reviewed:  Reviewed past medical,surgical, social, obstetrical and family history.  Reviewed problem list, medications and allergies. Physical Assessment:  There were no vitals filed for this visit.There is no height or weight on file to calculate BMI.           Physical Examination:   General appearance: alert, well appearing, and in no distress, oriented to person, place, and time and overweight  Mental status: alert, oriented to person, place, and time, normal mood, behavior, speech, dress, motor activity, and thought processes  Skin: warm & dry   Extremities:      Cardiovascular: normal heart rate noted  Respiratory: normal respiratory effort, no distress  Abdomen: gravid, soft, non-tender  Pelvic: Cervical exam deferred         Fetal Status:          Fetal Surveillance Testing today: NST  Chaperone: n/a    No results found for this or any previous visit  (from the past 24 hour(s)).  Assessment & Plan:  1) High-risk pregnancy G4P0030 at [redacted]w[redacted]d with an Estimated Date of Delivery: 09/25/19   2) Class A2 DM, stable  3) obese Body mass index is 44.21 kg/m. 20 lb weight gain , stable  Meds: No orders of the defined types were placed in this encounter.  Labs/procedures today: nst  Treatment Plan: Return in four days for NST.  Reviewed: Preterm labor symptoms and general obstetric precautions including but not limited to vaginal bleeding, contractions, leaking of fluid and fetal movement were reviewed in detail with the patient.  All questions were answered. Has home bp cuff. Check bp weekly, let us know if >140/90.   Follow-up: No follow-ups on file.  No orders of the defined types were placed in this encounter.  By signing my name below, I, Clerance Lav, attest that this documentation has been prepared under the direction and in the presence of [name], MD. Electronically Signed: [place]. 08/13/19. 11:17 PM.  I personally performed the services described in this documentation, which was SCRIBED in my presence. The recorded information has been reviewed and considered accurate. It has been edited as necessary during review. [name], MD

## 2019-08-14 ENCOUNTER — Ambulatory Visit (INDEPENDENT_AMBULATORY_CARE_PROVIDER_SITE_OTHER): Payer: Medicaid Other | Admitting: Obstetrics and Gynecology

## 2019-08-14 ENCOUNTER — Other Ambulatory Visit: Payer: Self-pay

## 2019-08-14 ENCOUNTER — Encounter: Payer: Self-pay | Admitting: Obstetrics and Gynecology

## 2019-08-14 VITALS — BP 149/76 | HR 94 | Wt 234.0 lb

## 2019-08-14 DIAGNOSIS — E669 Obesity, unspecified: Secondary | ICD-10-CM | POA: Diagnosis not present

## 2019-08-14 DIAGNOSIS — O0993 Supervision of high risk pregnancy, unspecified, third trimester: Secondary | ICD-10-CM

## 2019-08-14 DIAGNOSIS — O99213 Obesity complicating pregnancy, third trimester: Secondary | ICD-10-CM

## 2019-08-14 DIAGNOSIS — Z331 Pregnant state, incidental: Secondary | ICD-10-CM

## 2019-08-14 DIAGNOSIS — O24113 Pre-existing diabetes mellitus, type 2, in pregnancy, third trimester: Secondary | ICD-10-CM

## 2019-08-14 DIAGNOSIS — Z1389 Encounter for screening for other disorder: Secondary | ICD-10-CM

## 2019-08-14 DIAGNOSIS — O0990 Supervision of high risk pregnancy, unspecified, unspecified trimester: Secondary | ICD-10-CM

## 2019-08-14 DIAGNOSIS — Z6841 Body Mass Index (BMI) 40.0 and over, adult: Secondary | ICD-10-CM | POA: Diagnosis not present

## 2019-08-14 DIAGNOSIS — Z794 Long term (current) use of insulin: Secondary | ICD-10-CM

## 2019-08-14 DIAGNOSIS — Z3A33 33 weeks gestation of pregnancy: Secondary | ICD-10-CM | POA: Diagnosis not present

## 2019-08-14 LAB — POCT URINALYSIS DIPSTICK OB
Blood, UA: NEGATIVE
Glucose, UA: NEGATIVE
Ketones, UA: NEGATIVE
Leukocytes, UA: NEGATIVE
Nitrite, UA: NEGATIVE
POC,PROTEIN,UA: NEGATIVE

## 2019-08-18 ENCOUNTER — Ambulatory Visit (INDEPENDENT_AMBULATORY_CARE_PROVIDER_SITE_OTHER): Payer: Medicaid Other | Admitting: *Deleted

## 2019-08-18 ENCOUNTER — Other Ambulatory Visit: Payer: Self-pay

## 2019-08-18 VITALS — BP 129/86 | HR 97 | Wt 237.0 lb

## 2019-08-18 DIAGNOSIS — Z3A30 30 weeks gestation of pregnancy: Secondary | ICD-10-CM

## 2019-08-18 DIAGNOSIS — O288 Other abnormal findings on antenatal screening of mother: Secondary | ICD-10-CM | POA: Diagnosis not present

## 2019-08-18 DIAGNOSIS — O0990 Supervision of high risk pregnancy, unspecified, unspecified trimester: Secondary | ICD-10-CM

## 2019-08-18 DIAGNOSIS — O24419 Gestational diabetes mellitus in pregnancy, unspecified control: Secondary | ICD-10-CM

## 2019-08-18 DIAGNOSIS — Z1389 Encounter for screening for other disorder: Secondary | ICD-10-CM

## 2019-08-18 DIAGNOSIS — Z331 Pregnant state, incidental: Secondary | ICD-10-CM

## 2019-08-18 LAB — POCT URINALYSIS DIPSTICK OB
Blood, UA: NEGATIVE
Glucose, UA: NEGATIVE
Ketones, UA: NEGATIVE
Leukocytes, UA: NEGATIVE
Nitrite, UA: NEGATIVE

## 2019-08-18 NOTE — Progress Notes (Signed)
   NURSE VISIT- NST  SUBJECTIVE:  Julia Blanchard is a 29 y.o. G84P0030 female at [redacted]w[redacted]d, here for a NST for pregnancy complicated by 123456 currently on Metformin.  She reports active fetal movement, contractions: none, vaginal bleeding: none, membranes: intact.   OBJECTIVE:  BP 129/86   Pulse 97   Wt 237 lb (107.5 kg)   [name] 12/19/2018 (Exact Date)   BMI 44.78 kg/m   Appears well, no apparent distress  Results for orders placed or performed in visit on 08/18/19 (from the past 24 hour(s))  POC Urinalysis Dipstick OB   Collection Time: 08/18/19 10:44 AM  Result Value Ref Range   Color, UA     Clarity, UA     Glucose, UA Negative Negative   Bilirubin, UA     Ketones, UA neg    Spec Grav, UA     Blood, UA neg    pH, UA     POC,PROTEIN,UA Trace Negative, Trace, Small (1+), Moderate (2+), Large (3+), 4+   Urobilinogen, UA     Nitrite, UA neg    Leukocytes, UA Negative Negative   Appearance     Odor      NST: FHR baseline 145 bpm, Variability: moderate, Accelerations:present, Decelerations:  Absent= Cat 1/Reactive Toco: none   ASSESSMENT: G4P0030 at [redacted]w[redacted]d with A2DM currently on Metformin NST reactive  PLAN: EFM strip reviewed by Knute Neu, CNM, [facility]   Recommendations: keep next appointment as scheduled    Alice Rieger  08/18/2019 12:27 PM

## 2019-08-21 ENCOUNTER — Encounter: Payer: Self-pay | Admitting: Obstetrics and Gynecology

## 2019-08-21 ENCOUNTER — Ambulatory Visit (INDEPENDENT_AMBULATORY_CARE_PROVIDER_SITE_OTHER): Payer: Medicaid Other | Admitting: Obstetrics and Gynecology

## 2019-08-21 VITALS — BP 138/88 | HR 104 | Wt 237.3 lb

## 2019-08-21 DIAGNOSIS — O0990 Supervision of high risk pregnancy, unspecified, unspecified trimester: Secondary | ICD-10-CM

## 2019-08-21 DIAGNOSIS — Z1389 Encounter for screening for other disorder: Secondary | ICD-10-CM

## 2019-08-21 DIAGNOSIS — O99213 Obesity complicating pregnancy, third trimester: Secondary | ICD-10-CM

## 2019-08-21 DIAGNOSIS — Z331 Pregnant state, incidental: Secondary | ICD-10-CM

## 2019-08-21 DIAGNOSIS — O24415 Gestational diabetes mellitus in pregnancy, controlled by oral hypoglycemic drugs: Secondary | ICD-10-CM

## 2019-08-21 DIAGNOSIS — Z3A35 35 weeks gestation of pregnancy: Secondary | ICD-10-CM

## 2019-08-21 LAB — POCT URINALYSIS DIPSTICK OB
Blood, UA: NEGATIVE
Glucose, UA: NEGATIVE
Ketones, UA: NEGATIVE
Leukocytes, UA: NEGATIVE
Nitrite, UA: NEGATIVE

## 2019-08-21 NOTE — Progress Notes (Signed)
Patient ID: Danie Hodson, female   DOB: 1990-08-04, 29 y.o.   MRN: [id number]    [facility] PREGNANCY VISIT Patient name: Julia Blanchard MRN [id number]  Date of birth: 1990-06-23 Chief Complaint:   High Risk Gestation (NST- room # 8)  History of Present Illness:   Julia Blanchard is a 29 y.o. G65P0030 female at [redacted]w[redacted]d with an Estimated Date of Delivery: 09/25/19 being seen today for ongoing management of a high-risk pregnancy complicated by diabetes mellitus A2DM currently on Metformin 500 qhs.  Today she reports no complaints. She has had a single fasting glucose that was above 95. All post-prandials were all below 120. Depression screen [facility] 2/9 07/09/2019 03/17/2019 01/26/2019  Decreased Interest 0 0 0  Down, Depressed, Hopeless 0 0 0  PHQ - 2 Score 0 0 0    Contractions: Irregular.  .  Movement: Present. denies leaking of fluid.  Review of Systems:   Pertinent items are noted in HPI Denies abnormal vaginal discharge w/ itching/odor/irritation, headaches, visual changes, shortness of breath, chest pain, abdominal pain, severe nausea/vomiting, or problems with urination or bowel movements unless otherwise stated above. Pertinent History Reviewed:  Reviewed past medical,surgical, social, obstetrical and family history.  Reviewed problem list, medications and allergies. Physical Assessment:   Vitals:   08/21/19 1042  BP: 138/88  Pulse: (!) 104  Weight: 237 lb 4.8 oz (107.6 kg)  Body mass index is 44.84 kg/m.           Physical Examination:   General appearance: alert, well appearing, and in no distress  Mental status: normal mood, behavior, speech, dress, motor activity, and thought processes, affect appropriate to mood  Skin: warm & dry   Extremities: Edema: Trace    Cardiovascular: normal heart rate noted  Respiratory: normal respiratory effort, no distress  Abdomen: gravid, soft, non-tender  Pelvic: Cervical exam deferred         Fetal Status:     Movement: Present     Fetal Surveillance Testing today: NST  Chaperone: Julia Blanchard    Results for orders placed or performed in visit on 08/21/19 (from the past 24 hour(s))  POC Urinalysis Dipstick OB   Collection Time: 08/21/19 10:48 AM  Result Value Ref Range   Color, UA     Clarity, UA     Glucose, UA Negative Negative   Bilirubin, UA     Ketones, UA neg    Spec Grav, UA     Blood, UA neg    pH, UA     POC,PROTEIN,UA Trace Negative, Trace, Small (1+), Moderate (2+), Large (3+), 4+   Urobilinogen, UA     Nitrite, UA neg    Leukocytes, UA Negative Negative   Appearance     Odor      Assessment & Plan:  1) High-risk pregnancy G4P0030 at [redacted]w[redacted]d with an Estimated Date of Delivery: 09/25/19  . LGA infant, check u/s at 36 wk.  2) Class A2 DM, stable satisfactory control  3) Obese, BMI is 44.84 kg/m. 33 lb weight gain, stable  Meds: No orders of the defined types were placed in this encounter.  Labs/procedures today: NSTreactive. CBG review; satisfactory control  Treatment Plan: Return in four days for NST  1 wk for EFW by u/s  Reviewed: Preterm labor symptoms and general obstetric precautions including but not limited to vaginal bleeding, contractions, leaking of fluid and fetal movement were reviewed in detail with the patient.  All questions were answered. Has home bp cuff. Check bp weekly,  let us know if >140/90.   Follow-up: Return in about 4 days (around 08/25/2019) for NST.  Orders Placed This Encounter  Procedures  . POC Urinalysis Dipstick OB   By signing my name below, I, Julia Blanchard, attest that this documentation has been prepared under the direction and in the presence of [name], MD. Electronically Signed: [place]. 08/21/19. 11:44 AM.  I personally performed the services described in this documentation, which was SCRIBED in my presence. The recorded information has been reviewed and considered accurate. It has been edited as necessary during  review. [name], MD

## 2019-08-24 ENCOUNTER — Other Ambulatory Visit: Payer: Self-pay | Admitting: Obstetrics and Gynecology

## 2019-08-24 DIAGNOSIS — O24419 Gestational diabetes mellitus in pregnancy, unspecified control: Secondary | ICD-10-CM

## 2019-08-25 ENCOUNTER — Encounter: Payer: Self-pay | Admitting: Women's Health

## 2019-08-25 ENCOUNTER — Other Ambulatory Visit (HOSPITAL_COMMUNITY)
Admission: RE | Admit: 2019-08-25 | Discharge: 2019-08-25 | Disposition: A | Discharge status: Home / Self Care | Payer: Medicaid Other | Source: Ambulatory Visit | Attending: Obstetrics and Gynecology | Admitting: Obstetrics and Gynecology

## 2019-08-25 ENCOUNTER — Ambulatory Visit (INDEPENDENT_AMBULATORY_CARE_PROVIDER_SITE_OTHER): Payer: Medicaid Other

## 2019-08-25 ENCOUNTER — Ambulatory Visit (INDEPENDENT_AMBULATORY_CARE_PROVIDER_SITE_OTHER): Payer: Medicaid Other | Admitting: Women's Health

## 2019-08-25 ENCOUNTER — Other Ambulatory Visit: Payer: Medicaid Other

## 2019-08-25 VITALS — BP 131/81 | HR 89 | Wt 240.8 lb

## 2019-08-25 DIAGNOSIS — Z113 Encounter for screening for infections with a predominantly sexual mode of transmission: Secondary | ICD-10-CM | POA: Diagnosis not present

## 2019-08-25 DIAGNOSIS — O0993 Supervision of high risk pregnancy, unspecified, third trimester: Secondary | ICD-10-CM

## 2019-08-25 DIAGNOSIS — O0990 Supervision of high risk pregnancy, unspecified, unspecified trimester: Secondary | ICD-10-CM

## 2019-08-25 DIAGNOSIS — O24415 Gestational diabetes mellitus in pregnancy, controlled by oral hypoglycemic drugs: Secondary | ICD-10-CM

## 2019-08-25 DIAGNOSIS — Z3A35 35 weeks gestation of pregnancy: Secondary | ICD-10-CM

## 2019-08-25 DIAGNOSIS — Z331 Pregnant state, incidental: Secondary | ICD-10-CM

## 2019-08-25 DIAGNOSIS — Z1389 Encounter for screening for other disorder: Secondary | ICD-10-CM

## 2019-08-25 DIAGNOSIS — O24419 Gestational diabetes mellitus in pregnancy, unspecified control: Secondary | ICD-10-CM

## 2019-08-25 LAB — POCT URINALYSIS DIPSTICK OB
Blood, UA: NEGATIVE
Glucose, UA: NEGATIVE
Ketones, UA: NEGATIVE
Leukocytes, UA: NEGATIVE
Nitrite, UA: NEGATIVE
POC,PROTEIN,UA: NEGATIVE

## 2019-08-25 NOTE — Progress Notes (Signed)
[place] [id number] wks,cephalic,BPP [id number] placenta gr 3,FHR 130 bpm,AFI 11.5 cm,BPP 8/8,EFW 2953 g 74%,AC 96%

## 2019-08-25 NOTE — Progress Notes (Signed)
HIGH-RISK PREGNANCY VISIT Patient name: Julia Blanchard MRN [id number]  Date of birth: [birth year] Chief Complaint:   Routine Prenatal Visit (Ultrasound)  History of Present Illness:   Julia Blanchard is a 29 y.o. G22P0030 female at [redacted]w[redacted]d with an Estimated Date of Delivery: 09/25/19 being seen today for ongoing management of a high-risk pregnancy complicated by diabetes mellitus A2DM currently on metformin 500mg  qhs.  Today she reports all sugars wnl except 1 FBS 101.  Depression screen [facility] 2/9 07/09/2019 03/17/2019 01/26/2019  Decreased Interest 0 0 0  Down, Depressed, Hopeless 0 0 0  PHQ - 2 Score 0 0 0    Contractions: Not present. Vag. Bleeding: None.  Movement: Present. denies leaking of fluid.  Review of Systems:   Pertinent items are noted in HPI Denies abnormal vaginal discharge w/ itching/odor/irritation, headaches, visual changes, shortness of breath, chest pain, abdominal pain, severe nausea/vomiting, or problems with urination or bowel movements unless otherwise stated above. Pertinent History Reviewed:  Reviewed past medical,surgical, social, obstetrical and family history.  Reviewed problem list, medications and allergies. Physical Assessment:   Vitals:   08/25/19 0946  BP: 131/81  Pulse: 89  Weight: 240 lb 12.8 oz (109.2 kg)  Body mass index is 45.5 kg/m.           Physical Examination:   General appearance: alert, well appearing, and in no distress  Mental status: alert, oriented to person, place, and time  Skin: warm & dry   Extremities: Edema: Trace    Cardiovascular: normal heart rate noted  Respiratory: normal respiratory effort, no distress  Abdomen: gravid, soft, non-tender  Pelvic: Cervical exam performed  Dilation: Closed Effacement (%): Thick Station: -3  Fetal Status: Fetal Heart Rate (bpm): 130 u/s   Movement: Present Presentation: Vertex  Fetal Surveillance Testing today: [place] [id number] wks,cephalic,BPP [id number] placenta gr 3,FHR 130 bpm,AFI 11.5  cm,BPP 8/8,EFW 2953 g 74%,AC 96%   Chaperone: Diona Fanti    Results for orders placed or performed in visit on 08/25/19 (from the past 24 hour(s))  POC Urinalysis Dipstick OB   Collection Time: 08/25/19  9:45 AM  Result Value Ref Range   Color, UA     Clarity, UA     Glucose, UA Negative Negative   Bilirubin, UA     Ketones, UA n    Spec Grav, UA     Blood, UA n    pH, UA     POC,PROTEIN,UA Negative Negative, Trace, Small (1+), Moderate (2+), Large (3+), 4+   Urobilinogen, UA     Nitrite, UA n    Leukocytes, UA Negative Negative   Appearance     Odor      Assessment & Plan:  1) High-risk pregnancy G4P0030 at [redacted]w[redacted]d with an Estimated Date of Delivery: 09/25/19   2) A2DM, stable on metformin 500mg  qhs, EFW 74% w/ normal AFI today  Meds: No orders of the defined types were placed in this encounter.  Labs/procedures today: gbs, gc/ct, sve  Treatment Plan:   2x/wk NSTs    Deliver @ 39wks  Reviewed: Preterm labor symptoms and general obstetric precautions including but not limited to vaginal bleeding, contractions, leaking of fluid and fetal movement were reviewed in detail with the patient.  All questions were answered. Has home bp cuff. Check bp weekly, let us know if >140/90.   Follow-up: Return for As scheduled Fri nst w/ nurse.  Orders Placed This Encounter  Procedures  . Strep Gp B NAA  . POC Urinalysis Dipstick  OB   Roma Schanz CNM, [facility] 08/25/2019 10:19 AM

## 2019-08-25 NOTE — Patient Instructions (Signed)
Julia Blanchard, I greatly value your feedback.  If you receive a survey following your visit with [place] today, we appreciate you taking the time to fill it out.  Thanks, Julia Blanchard, CNM, WHNP-BC  [facility] [place] at [facility] ([place], [place] [address]) Entrance C, located off of [address] parking   Go to [email or web address] to register for FREE online childbirth classes    Call the office [phone]) or go to [facility] if:  You begin to have strong, frequent contractions  Your water breaks.  Sometimes it is a big gush of fluid, sometimes it is just a trickle that keeps getting your panties wet or running down your legs  You have vaginal bleeding.  It is normal to have a small amount of spotting if your cervix was checked.   You don't feel your baby moving like normal.  If you don't, get you something to eat and drink and lay down and focus on feeling your baby move.  You should feel at least 10 movements in 2 hours.  If you don't, you should call the office or go to [facility].   Call the office [phone]) or go to [facility] hospital for these signs of pre-eclampsia:  Severe headache that does not go away with Tylenol  Visual changes- seeing spots, double, blurred vision  Pain under your right breast or upper abdomen that does not go away with Tums or heartburn medicine  Nausea and/or vomiting  Severe swelling in your hands, feet, and face    Home Blood Pressure Monitoring for Patients   Your provider has recommended that you check your blood pressure (BP) at least once a week at home. If you do not have a blood pressure cuff at home, one will be provided for you. Contact your provider if you have not received your monitor within 1 week.   Helpful Tips for Accurate Home Blood Pressure Checks  . Don't smoke, exercise, or drink caffeine 30 minutes before checking your BP . Use the restroom before checking your BP (a full  bladder can raise your pressure) . Relax in a comfortable upright chair . Feet on the ground . Left arm resting comfortably on a flat surface at the level of your heart . Legs uncrossed . Back supported . Sit quietly and don't talk . Place the cuff on your bare arm . Adjust snuggly, so that only two fingertips can fit between your skin and the top of the cuff . Check 2 readings separated by at least one minute . Keep a log of your BP readings . For a visual, please reference this diagram: http://ccnc.care/bpdiagram  Provider Name: [facility]     Phone: [phone]  Zone 1: ALL CLEAR  Continue to monitor your symptoms:  . BP reading is less than 140 (top number) or less than 90 (bottom number)  . No right upper stomach pain . No headaches or seeing spots . No feeling nauseated or throwing up . No swelling in face and hands  Zone 2: CAUTION Call your doctor's office for any of the following:  . BP reading is greater than 140 (top number) or greater than 90 (bottom number)  . Stomach pain under your ribs in the middle or right side . Headaches or seeing spots . Feeling nauseated or throwing up . Swelling in face and hands  Zone 3: EMERGENCY  Seek immediate medical care if you have any of the  following:  . BP reading is greater than160 (top number) or greater than 110 (bottom number) . Severe headaches not improving with Tylenol . Serious difficulty catching your breath . Any worsening symptoms from Zone 2  Preterm Labor and Birth Information  The normal length of a pregnancy is 39-41 weeks. Preterm labor is when labor starts before 37 completed weeks of pregnancy. What are the risk factors for preterm labor? Preterm labor is more likely to occur in women who:  Have certain infections during pregnancy such as a bladder infection, sexually transmitted infection, or infection inside the uterus (chorioamnionitis).  Have a shorter-than-normal cervix.  Have gone into  preterm labor before.  Have had surgery on their cervix.  Are younger than age 41 or older than age 6.  Are African American.  Are pregnant with twins or multiple babies (multiple gestation).  Take street drugs or smoke while pregnant.  Do not gain enough weight while pregnant.  Became pregnant shortly after having been pregnant. What are the symptoms of preterm labor? Symptoms of preterm labor include:  Cramps similar to those that can happen during a menstrual period. The cramps may happen with diarrhea.  Pain in the abdomen or lower back.  Regular uterine contractions that may feel like tightening of the abdomen.  A feeling of increased pressure in the pelvis.  Increased watery or bloody mucus discharge from the vagina.  Water breaking (ruptured amniotic sac). Why is it important to recognize signs of preterm labor? It is important to recognize signs of preterm labor because babies who are born prematurely may not be fully developed. This can put them at an increased risk for:  Long-term (chronic) heart and lung problems.  Difficulty immediately after birth with regulating body systems, including blood sugar, body temperature, heart rate, and breathing rate.  Bleeding in the brain.  Cerebral palsy.  Learning difficulties.  Death. These risks are highest for babies who are born before 21 weeks of pregnancy. How is preterm labor treated? Treatment depends on the length of your pregnancy, your condition, and the health of your baby. It may involve: 1. Having a stitch (suture) placed in your cervix to prevent your cervix from opening too early (cerclage). 2. Taking or being given medicines, such as: ? Hormone medicines. These may be given early in pregnancy to help support the pregnancy. ? Medicine to stop contractions. ? Medicines to help mature the baby's lungs. These may be prescribed if the risk of delivery is high. ? Medicines to prevent your baby from  developing cerebral palsy. If the labor happens before 34 weeks of pregnancy, you may need to stay in the hospital. What should I do if I think I am in preterm labor? If you think that you are going into preterm labor, call your health care provider right away. How can I prevent preterm labor in future pregnancies? To increase your chance of having a full-term pregnancy:  Do not use any tobacco products, such as cigarettes, chewing tobacco, and e-cigarettes. If you need help quitting, ask your health care provider.  Do not use street drugs or medicines that have not been prescribed to you during your pregnancy.  Talk with your health care provider before taking any herbal supplements, even if you have been taking them regularly.  Make sure you gain a healthy amount of weight during your pregnancy.  Watch for infection. If you think that you might have an infection, get it checked right away.  Make sure to tell  your health care provider if you have gone into preterm labor before. This information is not intended to replace advice given to you by your health care provider. Make sure you discuss any questions you have with your health care provider. Document Revised: 07/18/2018 Document Reviewed: 08/17/2015 Elsevier Patient Education  [place].

## 2019-08-26 LAB — CERVICOVAGINAL ANCILLARY ONLY
Chlamydia: NEGATIVE
Comment: NEGATIVE
Comment: NORMAL
Neisseria Gonorrhea: NEGATIVE

## 2019-08-27 LAB — STREP GP B NAA: Strep Gp B NAA: NEGATIVE

## 2019-08-28 ENCOUNTER — Other Ambulatory Visit: Payer: Medicaid Other

## 2019-08-28 ENCOUNTER — Encounter: Payer: Medicaid Other | Admitting: Obstetrics & Gynecology

## 2019-08-28 ENCOUNTER — Ambulatory Visit (INDEPENDENT_AMBULATORY_CARE_PROVIDER_SITE_OTHER): Payer: Medicaid Other | Admitting: *Deleted

## 2019-08-28 VITALS — BP 136/83 | HR 104 | Wt 240.0 lb

## 2019-08-28 DIAGNOSIS — O0990 Supervision of high risk pregnancy, unspecified, unspecified trimester: Secondary | ICD-10-CM | POA: Diagnosis not present

## 2019-08-28 DIAGNOSIS — Z3A36 36 weeks gestation of pregnancy: Secondary | ICD-10-CM | POA: Diagnosis not present

## 2019-08-28 DIAGNOSIS — Z331 Pregnant state, incidental: Secondary | ICD-10-CM

## 2019-08-28 DIAGNOSIS — O24419 Gestational diabetes mellitus in pregnancy, unspecified control: Secondary | ICD-10-CM | POA: Diagnosis not present

## 2019-08-28 DIAGNOSIS — Z1389 Encounter for screening for other disorder: Secondary | ICD-10-CM

## 2019-08-28 LAB — POCT URINALYSIS DIPSTICK OB
Blood, UA: NEGATIVE
Glucose, UA: NEGATIVE
Ketones, UA: NEGATIVE
Leukocytes, UA: NEGATIVE
Nitrite, UA: NEGATIVE
POC,PROTEIN,UA: NEGATIVE

## 2019-08-28 NOTE — Progress Notes (Signed)
   NURSE VISIT- NST  SUBJECTIVE:  Julia Blanchard is a 29 y.o. G36P0030 female at [redacted]w[redacted]d, here for a NST for pregnancy complicated by 123456 currently on Metformin 500 mg BID.  She reports active fetal movement, contractions: none, vaginal bleeding: none, membranes: intact.   OBJECTIVE:  BP 136/83   Pulse (!) 104   Wt 240 lb (108.9 kg)   [name] 12/19/2018 (Exact Date)   BMI 45.35 kg/m   Appears well, no apparent distress  Results for orders placed or performed in visit on 08/28/19 (from the past 24 hour(s))  POC Urinalysis Dipstick OB   Collection Time: 08/28/19 10:54 AM  Result Value Ref Range   Color, UA     Clarity, UA     Glucose, UA Negative Negative   Bilirubin, UA     Ketones, UA n    Spec Grav, UA     Blood, UA n    pH, UA     POC,PROTEIN,UA Negative Negative, Trace, Small (1+), Moderate (2+), Large (3+), 4+   Urobilinogen, UA     Nitrite, UA n    Leukocytes, UA Negative Negative   Appearance     Odor      NST: FHR baseline 150 bpm, Variability: moderate, Accelerations:present, Decelerations:  Absent= Cat 1 /Reactive Toco: none   ASSESSMENT: G4P0030 at [redacted]w[redacted]d with A2DM currently on Metformin 500 mg BID NST reactive  PLAN: EFM strip reviewed by Dr. Elonda Husky   Recommendations: keep next appointment as scheduled    Rolena Infante  08/28/2019 11:09 AM

## 2019-09-01 ENCOUNTER — Ambulatory Visit (INDEPENDENT_AMBULATORY_CARE_PROVIDER_SITE_OTHER): Payer: Medicaid Other | Admitting: *Deleted

## 2019-09-01 VITALS — BP 141/91 | HR 92 | Wt 240.6 lb

## 2019-09-01 DIAGNOSIS — Z3A36 36 weeks gestation of pregnancy: Secondary | ICD-10-CM

## 2019-09-01 DIAGNOSIS — O0993 Supervision of high risk pregnancy, unspecified, third trimester: Secondary | ICD-10-CM | POA: Diagnosis not present

## 2019-09-01 DIAGNOSIS — O0990 Supervision of high risk pregnancy, unspecified, unspecified trimester: Secondary | ICD-10-CM

## 2019-09-01 DIAGNOSIS — Z331 Pregnant state, incidental: Secondary | ICD-10-CM

## 2019-09-01 DIAGNOSIS — O24419 Gestational diabetes mellitus in pregnancy, unspecified control: Secondary | ICD-10-CM

## 2019-09-01 DIAGNOSIS — Z1389 Encounter for screening for other disorder: Secondary | ICD-10-CM

## 2019-09-01 LAB — POCT URINALYSIS DIPSTICK OB
Blood, UA: NEGATIVE
Glucose, UA: NEGATIVE
Ketones, UA: NEGATIVE
Leukocytes, UA: NEGATIVE
Nitrite, UA: NEGATIVE
POC,PROTEIN,UA: NEGATIVE

## 2019-09-01 NOTE — Progress Notes (Signed)
   NURSE VISIT- NST  SUBJECTIVE:  Julia Blanchard is a 29 y.o. G108P0030 female at [redacted]w[redacted]d, here for a NST for pregnancy complicated by 123456 currently on Metformin.  She reports active fetal movement, contractions: none, vaginal bleeding: none, membranes: intact.   OBJECTIVE:  BP (!) 141/91   Pulse 92   Wt 240 lb 9.6 oz (109.1 kg)   [name] 12/19/2018 (Exact Date)   BMI 45.46 kg/m   Appears well, no apparent distress  Results for orders placed or performed in visit on 09/01/19 (from the past 24 hour(s))  POC Urinalysis Dipstick OB   Collection Time: 09/01/19 10:27 AM  Result Value Ref Range   Color, UA     Clarity, UA     Glucose, UA Negative Negative   Bilirubin, UA     Ketones, UA n    Spec Grav, UA     Blood, UA n    pH, UA     POC,PROTEIN,UA Negative Negative, Trace, Small (1+), Moderate (2+), Large (3+), 4+   Urobilinogen, UA     Nitrite, UA n    Leukocytes, UA Negative Negative   Appearance     Odor      NST: FHR baseline 130 bpm, Variability: moderate, Accelerations:present, Decelerations:  Absent= Cat 1/Reactive Toco:  none ASSESSMENT: G4P0030 at [redacted]w[redacted]d with A2DM currently on Metformin NST reactive  PLAN: EFM strip reviewed by Dr. Elonda Husky   Recommendations: keep next appointment as scheduled    Alice Rieger  09/01/2019 11:28 AM

## 2019-09-04 ENCOUNTER — Encounter: Payer: Self-pay | Admitting: Obstetrics & Gynecology

## 2019-09-04 ENCOUNTER — Ambulatory Visit (INDEPENDENT_AMBULATORY_CARE_PROVIDER_SITE_OTHER): Payer: Medicaid Other | Admitting: Obstetrics & Gynecology

## 2019-09-04 ENCOUNTER — Other Ambulatory Visit: Payer: Self-pay

## 2019-09-04 VITALS — BP 127/82 | HR 89 | Wt 242.0 lb

## 2019-09-04 DIAGNOSIS — O24415 Gestational diabetes mellitus in pregnancy, controlled by oral hypoglycemic drugs: Secondary | ICD-10-CM | POA: Diagnosis not present

## 2019-09-04 DIAGNOSIS — O0993 Supervision of high risk pregnancy, unspecified, third trimester: Secondary | ICD-10-CM

## 2019-09-04 DIAGNOSIS — Z331 Pregnant state, incidental: Secondary | ICD-10-CM

## 2019-09-04 DIAGNOSIS — O24419 Gestational diabetes mellitus in pregnancy, unspecified control: Secondary | ICD-10-CM

## 2019-09-04 DIAGNOSIS — Z1389 Encounter for screening for other disorder: Secondary | ICD-10-CM

## 2019-09-04 DIAGNOSIS — Z3A37 37 weeks gestation of pregnancy: Secondary | ICD-10-CM | POA: Diagnosis not present

## 2019-09-04 DIAGNOSIS — O0990 Supervision of high risk pregnancy, unspecified, unspecified trimester: Secondary | ICD-10-CM

## 2019-09-04 LAB — POCT URINALYSIS DIPSTICK OB
Blood, UA: NEGATIVE
Glucose, UA: NEGATIVE
Ketones, UA: NEGATIVE
Leukocytes, UA: NEGATIVE
Nitrite, UA: NEGATIVE
POC,PROTEIN,UA: NEGATIVE

## 2019-09-04 MED ORDER — METFORMIN HCL 1000 MG PO TABS: 1000.0000 mg | ORAL_TABLET | Freq: Every day | ORAL | 1 refills | Status: DC

## 2019-09-04 NOTE — Progress Notes (Signed)
HIGH-RISK PREGNANCY VISIT Patient name: Julia Blanchard MRN [id number]  Date of birth: [birth year] Chief Complaint:   High Risk Gestation (nst/ room # 12, back pain)  History of Present Illness:   Julia Blanchard is a 29 y.o. G55P0030 female at [redacted]w[redacted]d with an Estimated Date of Delivery: 09/25/19 being seen today for ongoing management of a high-risk pregnancy complicated by Class A2 DM.  Today she reports no complaints.  Depression screen [facility] 2/9 07/09/2019 03/17/2019 01/26/2019  Decreased Interest 0 0 0  Down, Depressed, Hopeless 0 0 0  PHQ - 2 Score 0 0 0    Contractions: Not present.  .  Movement: Present. denies leaking of fluid.  Review of Systems:   Pertinent items are noted in HPI Denies abnormal vaginal discharge w/ itching/odor/irritation, headaches, visual changes, shortness of breath, chest pain, abdominal pain, severe nausea/vomiting, or problems with urination or bowel movements unless otherwise stated above. Pertinent History Reviewed:  Reviewed past medical,surgical, social, obstetrical and family history.  Reviewed problem list, medications and allergies. Physical Assessment:   Vitals:   09/04/19 1039  BP: 127/82  Pulse: 89  Weight: 242 lb (109.8 kg)  Body mass index is 45.73 kg/m.           Physical Examination:   General appearance: alert, well appearing, and in no distress  Mental status: alert, oriented to person, place, and time  Skin: warm & dry   Extremities: Edema: Trace    Cardiovascular: normal heart rate noted  Respiratory: normal respiratory effort, no distress  Abdomen: gravid, soft, non-tender  Pelvic: Cervical exam deferred         Fetal Status: Fetal Heart Rate (bpm): 140 Fundal Height: 39 cm Movement: Present    Fetal Surveillance Testing today: Rective NST  Julia Blanchard is at [redacted]w[redacted]d Estimated Date of Delivery: 09/25/19  NST being performed due to Class A2 DM  Today the NST is Reactive  Fetal Monitoring:  Baseline: 140 bpm,  Variability: Good {> 6 bpm), Accelerations: Reactive and Decelerations: Absent   reactive  The accelerations are >15 bpm and more than 2 in 20 minutes  Final diagnosis:  Reactive NST  Florian Buff, MD      Chaperone: n/a    Results for orders placed or performed in visit on 09/04/19 (from the past 24 hour(s))  POC Urinalysis Dipstick OB   Collection Time: 09/04/19 10:47 AM  Result Value Ref Range   Color, UA     Clarity, UA     Glucose, UA Negative Negative   Bilirubin, UA     Ketones, UA neg    Spec Grav, UA     Blood, UA neg    pH, UA     POC,PROTEIN,UA Negative Negative, Trace, Small (1+), Moderate (2+), Large (3+), 4+   Urobilinogen, UA     Nitrite, UA neg    Leukocytes, UA Negative Negative   Appearance     Odor      Assessment & Plan:  1) High-risk pregnancy G4P0030 at [redacted]w[redacted]d with an Estimated Date of Delivery: 09/25/19   2) Class A2 DM, stable, slow creep up in the fastings, will increase metformin to 100 mg at bedtime, EFW 74%    Meds:  Meds ordered this encounter  Medications  . metFORMIN (GLUCOPHAGE) 1000 MG tablet    Sig: Take 1 tablet (1,000 mg total) by mouth at bedtime.    Dispense:  30 tablet    Refill:  1    Labs/procedures today:  Treatment Plan:  Twice weekly NST with IOL 39 weeks  Reviewed: Term labor symptoms and general obstetric precautions including but not limited to vaginal bleeding, contractions, leaking of fluid and fetal movement were reviewed in detail with the patient.  All questions were answered. Has home bp cuff. Rx faxed to . Check bp weekly, let us know if >140/90.   Follow-up: Return in about 4 days (around 09/08/2019) for NST, HROB.  Orders Placed This Encounter  Procedures  . POC Urinalysis Dipstick OB   Florian Buff  09/04/2019 11:56 AM

## 2019-09-08 ENCOUNTER — Ambulatory Visit (INDEPENDENT_AMBULATORY_CARE_PROVIDER_SITE_OTHER): Payer: Medicaid Other | Admitting: *Deleted

## 2019-09-08 VITALS — BP 128/83 | HR 92 | Wt 241.0 lb

## 2019-09-08 DIAGNOSIS — O288 Other abnormal findings on antenatal screening of mother: Secondary | ICD-10-CM

## 2019-09-08 DIAGNOSIS — O24415 Gestational diabetes mellitus in pregnancy, controlled by oral hypoglycemic drugs: Secondary | ICD-10-CM

## 2019-09-08 DIAGNOSIS — Z331 Pregnant state, incidental: Secondary | ICD-10-CM

## 2019-09-08 DIAGNOSIS — Z3A37 37 weeks gestation of pregnancy: Secondary | ICD-10-CM

## 2019-09-08 DIAGNOSIS — O0990 Supervision of high risk pregnancy, unspecified, unspecified trimester: Secondary | ICD-10-CM

## 2019-09-08 DIAGNOSIS — Z1389 Encounter for screening for other disorder: Secondary | ICD-10-CM

## 2019-09-08 DIAGNOSIS — O24419 Gestational diabetes mellitus in pregnancy, unspecified control: Secondary | ICD-10-CM

## 2019-09-08 LAB — POCT URINALYSIS DIPSTICK OB
Blood, UA: NEGATIVE
Glucose, UA: NEGATIVE
Ketones, UA: NEGATIVE
Leukocytes, UA: NEGATIVE
Nitrite, UA: NEGATIVE
POC,PROTEIN,UA: NEGATIVE

## 2019-09-08 NOTE — Progress Notes (Signed)
   NURSE VISIT- NST  SUBJECTIVE:  Julia Blanchard is a 29 y.o. G59P0030 female at [redacted]w[redacted]d, here for a NST for pregnancy complicated by 123456 currently on Metformin.  She reports active fetal movement, contractions: none, vaginal bleeding: none, membranes: intact.   OBJECTIVE:  BP 128/83   Pulse 92   Wt 241 lb (109.3 kg)   [name] 12/19/2018 (Exact Date)   BMI 45.54 kg/m   Appears well, no apparent distress  Results for orders placed or performed in visit on 09/08/19 (from the past 24 hour(s))  POC Urinalysis Dipstick OB   Collection Time: 09/08/19 11:01 AM  Result Value Ref Range   Color, UA     Clarity, UA     Glucose, UA Negative Negative   Bilirubin, UA     Ketones, UA neg    Spec Grav, UA     Blood, UA neg    pH, UA     POC,PROTEIN,UA Negative Negative, Trace, Small (1+), Moderate (2+), Large (3+), 4+   Urobilinogen, UA     Nitrite, UA neg    Leukocytes, UA Negative Negative   Appearance     Odor      NST: FHR baseline 145 bpm, Variability: moderate, Accelerations:present, Decelerations:  Absent= Cat 1/Reactive Toco: occasional   ASSESSMENT: G4P0030 at [redacted]w[redacted]d with A2DM currently on Metformin NST reactive  PLAN: EFM strip reviewed by Knute Neu, CNM, [facility]   Recommendations: keep next appointment as scheduled    Alice Rieger  09/08/2019 11:36 AM

## 2019-09-11 ENCOUNTER — Encounter: Payer: Self-pay | Admitting: Obstetrics & Gynecology

## 2019-09-11 ENCOUNTER — Ambulatory Visit (INDEPENDENT_AMBULATORY_CARE_PROVIDER_SITE_OTHER): Payer: Medicaid Other | Admitting: Obstetrics & Gynecology

## 2019-09-11 VITALS — BP 136/87 | HR 91 | Wt 239.0 lb

## 2019-09-11 DIAGNOSIS — O24415 Gestational diabetes mellitus in pregnancy, controlled by oral hypoglycemic drugs: Secondary | ICD-10-CM | POA: Diagnosis not present

## 2019-09-11 DIAGNOSIS — Z3A38 38 weeks gestation of pregnancy: Secondary | ICD-10-CM | POA: Diagnosis not present

## 2019-09-11 DIAGNOSIS — O0990 Supervision of high risk pregnancy, unspecified, unspecified trimester: Secondary | ICD-10-CM

## 2019-09-11 DIAGNOSIS — Z1389 Encounter for screening for other disorder: Secondary | ICD-10-CM

## 2019-09-11 DIAGNOSIS — Z331 Pregnant state, incidental: Secondary | ICD-10-CM

## 2019-09-11 LAB — POCT URINALYSIS DIPSTICK OB
Blood, UA: NEGATIVE
Glucose, UA: NEGATIVE
Ketones, UA: NEGATIVE
Leukocytes, UA: NEGATIVE
Nitrite, UA: NEGATIVE
POC,PROTEIN,UA: NEGATIVE

## 2019-09-11 NOTE — Progress Notes (Signed)
Brown discharge.

## 2019-09-11 NOTE — Progress Notes (Signed)
HIGH-RISK PREGNANCY VISIT Patient name: Julia Blanchard MRN [id number]  Date of birth: 03-22-1991 Chief Complaint:   High Risk Gestation (NST)  History of Present Illness:   Julia Blanchard is a 29 y.o. G107P0030 female at [redacted]w[redacted]d with an Estimated Date of Delivery: 09/25/19 being seen today for ongoing management of a high-risk pregnancy complicated by diabetes mellitus A2DM currently on metformin 1000 mg at bedtime.  Today she reports no complaints.  Depression screen [facility] 2/9 07/09/2019 03/17/2019 01/26/2019  Decreased Interest 0 0 0  Down, Depressed, Hopeless 0 0 0  PHQ - 2 Score 0 0 0    Contractions: Irregular. Vag. Bleeding: None.  Movement: Present. denies leaking of fluid.  Review of Systems:   Pertinent items are noted in HPI Denies abnormal vaginal discharge w/ itching/odor/irritation, headaches, visual changes, shortness of breath, chest pain, abdominal pain, severe nausea/vomiting, or problems with urination or bowel movements unless otherwise stated above. Pertinent History Reviewed:  Reviewed past medical,surgical, social, obstetrical and family history.  Reviewed problem list, medications and allergies. Physical Assessment:   Vitals:   09/11/19 1037  BP: 136/87  Pulse: 91  Weight: 239 lb (108.4 kg)  Body mass index is 45.16 kg/m.           Physical Examination:   General appearance: alert, well appearing, and in no distress  Mental status: alert, oriented to person, place, and time  Skin: warm & dry   Extremities: Edema: Deep pitting, indentation remains for a short time    Cardiovascular: normal heart rate noted  Respiratory: normal respiratory effort, no distress  Abdomen: gravid, soft, non-tender  Pelvic: Cervical exam performed  Dilation: Closed Effacement (%): Thick Station: -3  Fetal Status: Fetal Heart Rate (bpm): 140 Fundal Height: 39 cm Movement: Present Presentation: Vertex  Fetal Surveillance Testing today: Reactive NST  Julia Blanchard is at  [redacted]w[redacted]d Estimated Date of Delivery: 09/25/19  NST being performed due to Class A2 DM  Today the NST is Reactive  Fetal Monitoring:  Baseline: 140s bpm, Variability: Good {> 6 bpm), Accelerations: Reactive and Decelerations: Absent   reactive  The accelerations are >15 bpm and more than 2 in 20 minutes  Final diagnosis:  Reactive NST  Julia Buff, MD      Chaperone: Julia Blanchard    Results for orders placed or performed in visit on 09/11/19 (from the past 24 hour(s))  POC Urinalysis Dipstick OB   Collection Time: 09/11/19 10:39 AM  Result Value Ref Range   Color, UA     Clarity, UA     Glucose, UA Negative Negative   Bilirubin, UA     Ketones, UA neg    Spec Grav, UA     Blood, UA neg    pH, UA     POC,PROTEIN,UA Negative Negative, Trace, Small (1+), Moderate (2+), Large (3+), 4+   Urobilinogen, UA     Nitrite, UA neg    Leukocytes, UA Negative Negative   Appearance     Odor      Assessment & Plan:  1) High-risk pregnancy G4P0030 at [redacted]w[redacted]d with an Estimated Date of Delivery: 09/25/19   2) A2DM, metformin 1000 qhs, stable, EFW 74%    Meds: No orders of the defined types were placed in this encounter.   Labs/procedures today:   Treatment Plan:  IOL 1 week NST Tuesday  Reviewed: Term labor symptoms and general obstetric precautions including but not limited to vaginal bleeding, contractions, leaking of fluid and fetal movement were  reviewed in detail with the patient.  All questions were answered. Has home bp cuff. Rx faxed to . Check bp weekly, let us know if >140/90.   Follow-up: Return in about 4 days (around 09/15/2019) for NST, Nurse only.  Orders Placed This Encounter  Procedures  . POC Urinalysis Dipstick OB   Julia Blanchard  09/11/2019 11:20 AM

## 2019-09-15 ENCOUNTER — Ambulatory Visit (INDEPENDENT_AMBULATORY_CARE_PROVIDER_SITE_OTHER): Payer: Medicaid Other | Admitting: Women's Health

## 2019-09-15 VITALS — BP 139/84 | HR 90 | Wt 243.5 lb

## 2019-09-15 DIAGNOSIS — O0993 Supervision of high risk pregnancy, unspecified, third trimester: Secondary | ICD-10-CM | POA: Diagnosis not present

## 2019-09-15 DIAGNOSIS — Z331 Pregnant state, incidental: Secondary | ICD-10-CM

## 2019-09-15 DIAGNOSIS — O24415 Gestational diabetes mellitus in pregnancy, controlled by oral hypoglycemic drugs: Secondary | ICD-10-CM

## 2019-09-15 DIAGNOSIS — Z3A38 38 weeks gestation of pregnancy: Secondary | ICD-10-CM | POA: Diagnosis not present

## 2019-09-15 DIAGNOSIS — Z1389 Encounter for screening for other disorder: Secondary | ICD-10-CM

## 2019-09-15 DIAGNOSIS — O24419 Gestational diabetes mellitus in pregnancy, unspecified control: Secondary | ICD-10-CM

## 2019-09-15 DIAGNOSIS — O0990 Supervision of high risk pregnancy, unspecified, unspecified trimester: Secondary | ICD-10-CM

## 2019-09-15 LAB — POCT URINALYSIS DIPSTICK OB
Glucose, UA: NEGATIVE
Ketones, UA: NEGATIVE
Nitrite, UA: NEGATIVE
POC,PROTEIN,UA: NEGATIVE

## 2019-09-15 NOTE — Progress Notes (Signed)
    NURSE VISIT- NST  SUBJECTIVE:  Julia Blanchard is a 29 y.o. G66P0030 female at [redacted]w[redacted]d, here for a NST for pregnancy complicated by G0FV currently on Metformin@hs .  She reports decreased  fetal movement, contractions: irregular, vaginal bleeding: scant staining, membranes: intact.   OBJECTIVE:  BP 139/84   Pulse 90   Wt 243 lb 8 oz (110.5 kg)   [name] 12/19/2018 (Exact Date)   BMI 46.01 kg/m   Appears well, no apparent distress  Results for orders placed or performed in visit on 09/15/19 (from the past 24 hour(s))  POC Urinalysis Dipstick OB   Collection Time: 09/15/19 10:00 AM  Result Value Ref Range   Color, UA     Clarity, UA     Glucose, UA Negative Negative   Bilirubin, UA     Ketones, UA neg    Spec Grav, UA     Blood, UA trace    pH, UA     POC,PROTEIN,UA Negative Negative, Trace, Small (1+), Moderate (2+), Large (3+), 4+   Urobilinogen, UA     Nitrite, UA neg    Leukocytes, UA Moderate (2+) (A) Negative   Appearance     Odor      NST: FHR baseline 140 bpm, Variability: moderate, Accelerations:present, Decelerations:  Absent= Cat 1/Reactive Toco: 1 uc   ASSESSMENT: G4P0030 at [redacted]w[redacted]d with A2DM currently on metformin qhs NST reactive  PLAN: EFM strip reviewed by Knute Neu, CNM, [facility]   Recommendations: keep next appointment as scheduled    Levy Pupa  09/15/2019 11:05 AM   Chart reviewed for nurse visit. I read NST and documented about it in note. Agree with plan of care. Pt not seen by provider.  Roma Schanz, [place] 09/15/2019 1:14 PM

## 2019-09-18 ENCOUNTER — Other Ambulatory Visit: Payer: Self-pay | Admitting: Obstetrics & Gynecology

## 2019-09-18 ENCOUNTER — Telehealth: Payer: Self-pay | Admitting: Women's Health

## 2019-09-18 NOTE — Telephone Encounter (Signed)
Pt wants to know when she is being scheduled to be induced pt is under the impression she is supposed to be induced today.

## 2019-09-18 NOTE — Telephone Encounter (Signed)
2330 tonight at [facility]

## 2019-09-18 NOTE — Telephone Encounter (Signed)
Pt aware to be at [facility] at 11:30 pm tonight. Pt voiced understanding. Uncertain

## 2019-09-19 ENCOUNTER — Inpatient Hospital Stay (HOSPITAL_COMMUNITY)
Admission: AD | Admit: 2019-09-19 | Discharge: 2019-09-21 | DRG: 807 | Disposition: A | Discharge status: Home / Self Care | Payer: Medicaid Other | Attending: Family Medicine | Admitting: Family Medicine

## 2019-09-19 ENCOUNTER — Inpatient Hospital Stay (HOSPITAL_COMMUNITY): Payer: Medicaid Other

## 2019-09-19 ENCOUNTER — Encounter (HOSPITAL_COMMUNITY): Payer: Self-pay | Admitting: Obstetrics & Gynecology

## 2019-09-19 ENCOUNTER — Other Ambulatory Visit: Payer: Self-pay

## 2019-09-19 DIAGNOSIS — Z8632 Personal history of gestational diabetes: Secondary | ICD-10-CM

## 2019-09-19 DIAGNOSIS — O0990 Supervision of high risk pregnancy, unspecified, unspecified trimester: Secondary | ICD-10-CM

## 2019-09-19 DIAGNOSIS — O26893 Other specified pregnancy related conditions, third trimester: Secondary | ICD-10-CM | POA: Diagnosis present

## 2019-09-19 DIAGNOSIS — Z87891 Personal history of nicotine dependence: Secondary | ICD-10-CM

## 2019-09-19 DIAGNOSIS — Z30017 Encounter for initial prescription of implantable subdermal contraceptive: Secondary | ICD-10-CM | POA: Diagnosis not present

## 2019-09-19 DIAGNOSIS — Z6791 Unspecified blood type, Rh negative: Secondary | ICD-10-CM | POA: Diagnosis not present

## 2019-09-19 DIAGNOSIS — Z20822 Contact with and (suspected) exposure to covid-19: Secondary | ICD-10-CM | POA: Diagnosis present

## 2019-09-19 DIAGNOSIS — Z3A39 39 weeks gestation of pregnancy: Secondary | ICD-10-CM | POA: Diagnosis not present

## 2019-09-19 DIAGNOSIS — O24425 Gestational diabetes mellitus in childbirth, controlled by oral hypoglycemic drugs: Principal | ICD-10-CM | POA: Diagnosis present

## 2019-09-19 DIAGNOSIS — Z8759 Personal history of other complications of pregnancy, childbirth and the puerperium: Secondary | ICD-10-CM

## 2019-09-19 HISTORY — DX: Gestational diabetes mellitus in pregnancy, unspecified control: O24.419

## 2019-09-19 LAB — COMPREHENSIVE METABOLIC PANEL
ALT: 13 U/L (ref 0–44)
AST: 15 U/L (ref 15–41)
Albumin: 2.4 g/dL — ABNORMAL LOW (ref 3.5–5.0)
Alkaline Phosphatase: 180 U/L — ABNORMAL HIGH (ref 38–126)
Anion gap: 9 (ref 5–15)
BUN: 10 mg/dL (ref 6–20)
CO2: 21 mmol/L — ABNORMAL LOW (ref 22–32)
Calcium: 8.6 mg/dL — ABNORMAL LOW (ref 8.9–10.3)
Chloride: 103 mmol/L (ref 98–111)
Creatinine, Ser: 0.77 mg/dL (ref 0.44–1.00)
GFR calc Af Amer: >60 mL/min (ref >60)
GFR calc non Af Amer: >60 mL/min (ref >60)
Glucose, Bld: 117 mg/dL — ABNORMAL HIGH (ref 70–99)
Potassium: 3.9 mmol/L (ref 3.5–5.1)
Sodium: 133 mmol/L — ABNORMAL LOW (ref 135–145)
Total Bilirubin: 0.2 mg/dL — ABNORMAL LOW (ref 0.3–1.2)
Total Protein: 5.9 g/dL — ABNORMAL LOW (ref 6.5–8.1)

## 2019-09-19 LAB — ABO/RH: ABO/RH(D): O NEG

## 2019-09-19 LAB — CBC
HCT: 37 % (ref 36.0–46.0)
Hemoglobin: 12.2 g/dL (ref 12.0–15.0)
MCH: 29.6 pg (ref 26.0–34.0)
MCHC: 33 g/dL (ref 30.0–36.0)
MCV: 89.8 fL (ref 80.0–100.0)
Platelets: 193 10*3/uL (ref 150–400)
RBC: 4.12 MIL/uL (ref 3.87–5.11)
RDW: 14.5 % (ref 11.5–15.5)
WBC: 11.6 10*3/uL — ABNORMAL HIGH (ref 4.0–10.5)
nRBC: 0 % (ref 0.0–0.2)

## 2019-09-19 LAB — GLUCOSE, CAPILLARY
Glucose-Capillary: 91 mg/dL (ref 70–99)
Glucose-Capillary: 94 mg/dL (ref 70–99)
Glucose-Capillary: 116 mg/dL — ABNORMAL HIGH (ref 70–99)
Glucose-Capillary: 92 mg/dL (ref 70–99)
Glucose-Capillary: 129 mg/dL — ABNORMAL HIGH (ref 70–99)

## 2019-09-19 LAB — RPR: RPR Ser Ql: NONREACTIVE

## 2019-09-19 LAB — TYPE AND SCREEN
ABO/RH(D): O NEG
Antibody Screen: NEGATIVE

## 2019-09-19 LAB — SARS CORONAVIRUS 2 BY RT PCR (HOSPITAL ORDER, PERFORMED IN ~~LOC~~ HOSPITAL LAB): SARS Coronavirus 2: NEGATIVE

## 2019-09-19 MED ORDER — OXYTOCIN-SODIUM CHLORIDE 30-0.9 UT/500ML-% IV SOLN
2.5000 [IU]/h | INTRAVENOUS | Status: DC
  Filled 2019-09-19 (×2): qty 500

## 2019-09-19 MED ORDER — WITCH HAZEL-GLYCERIN EX PADS: 1.0000 "application " | MEDICATED_PAD | CUTANEOUS | Status: DC | PRN

## 2019-09-19 MED ORDER — SENNOSIDES-DOCUSATE SODIUM 8.6-50 MG PO TABS
2.0000 | ORAL_TABLET | ORAL | Status: DC
  Administered 2019-09-20 (×2): 2 via ORAL
  Filled 2019-09-19 (×2): qty 2

## 2019-09-19 MED ORDER — DIBUCAINE (PERIANAL) 1 % EX OINT: 1.0000 "application " | TOPICAL_OINTMENT | CUTANEOUS | Status: DC | PRN

## 2019-09-19 MED ORDER — COCONUT OIL OIL: 1.0000 "application " | TOPICAL_OIL | Status: DC | PRN

## 2019-09-19 MED ORDER — MISOPROSTOL 100 MCG PO TABS
25.0000 ug | ORAL_TABLET | ORAL | Status: DC
  Administered 2019-09-19 (×2): 25 ug via VAGINAL
  Filled 2019-09-19 (×2): qty 1

## 2019-09-19 MED ORDER — ZOLPIDEM TARTRATE 5 MG PO TABS: 5.0000 mg | ORAL_TABLET | Freq: Every evening | ORAL | Status: DC | PRN

## 2019-09-19 MED ORDER — ACETAMINOPHEN 325 MG PO TABS
650.0000 mg | ORAL_TABLET | ORAL | Status: DC | PRN
  Administered 2019-09-21: 650 mg via ORAL
  Filled 2019-09-19: qty 2

## 2019-09-19 MED ORDER — OXYCODONE-ACETAMINOPHEN 5-325 MG PO TABS: 1.0000 | ORAL_TABLET | ORAL | Status: DC | PRN

## 2019-09-19 MED ORDER — TERBUTALINE SULFATE 1 MG/ML IJ SOLN: 0.2500 mg | Freq: Once | INTRAMUSCULAR | Status: DC | PRN

## 2019-09-19 MED ORDER — SIMETHICONE 80 MG PO CHEW: 80.0000 mg | CHEWABLE_TABLET | ORAL | Status: DC | PRN

## 2019-09-19 MED ORDER — IBUPROFEN 600 MG PO TABS
600.0000 mg | ORAL_TABLET | Freq: Four times a day (QID) | ORAL | Status: DC
  Administered 2019-09-20 – 2019-09-21 (×7): 600 mg via ORAL
  Filled 2019-09-19 (×7): qty 1

## 2019-09-19 MED ORDER — BENZOCAINE-MENTHOL 20-0.5 % EX AERO
1.0000 "application " | INHALATION_SPRAY | CUTANEOUS | Status: DC | PRN
  Administered 2019-09-20: 1 via TOPICAL
  Filled 2019-09-19: qty 56

## 2019-09-19 MED ORDER — ACETAMINOPHEN 325 MG PO TABS
650.0000 mg | ORAL_TABLET | ORAL | Status: DC | PRN
  Administered 2019-09-19: 650 mg via ORAL
  Filled 2019-09-19: qty 2

## 2019-09-19 MED ORDER — OXYTOCIN-SODIUM CHLORIDE 30-0.9 UT/500ML-% IV SOLN: 1.0000 m[IU]/min | INTRAVENOUS | Status: DC

## 2019-09-19 MED ORDER — PRENATAL MULTIVITAMIN CH
1.0000 | ORAL_TABLET | Freq: Every day | ORAL | Status: DC
  Administered 2019-09-20 – 2019-09-21 (×2): 1 via ORAL
  Filled 2019-09-19 (×2): qty 1

## 2019-09-19 MED ORDER — FENTANYL CITRATE (PF) 100 MCG/2ML IJ SOLN
50.0000 ug | INTRAMUSCULAR | Status: DC | PRN
  Administered 2019-09-19 (×2): 100 ug via INTRAVENOUS
  Filled 2019-09-19 (×2): qty 2

## 2019-09-19 MED ORDER — OXYTOCIN BOLUS FROM INFUSION
500.0000 mL | Freq: Once | INTRAVENOUS | Status: AC
  Administered 2019-09-19: 500 mL via INTRAVENOUS

## 2019-09-19 MED ORDER — LACTATED RINGERS IV SOLN: 500.0000 mL | INTRAVENOUS | Status: DC | PRN

## 2019-09-19 MED ORDER — DIPHENHYDRAMINE HCL 25 MG PO CAPS: 25.0000 mg | ORAL_CAPSULE | Freq: Four times a day (QID) | ORAL | Status: DC | PRN

## 2019-09-19 MED ORDER — OXYCODONE-ACETAMINOPHEN 5-325 MG PO TABS: 2.0000 | ORAL_TABLET | ORAL | Status: DC | PRN

## 2019-09-19 MED ORDER — ONDANSETRON HCL 4 MG/2ML IJ SOLN: 4.0000 mg | Freq: Four times a day (QID) | INTRAMUSCULAR | Status: DC | PRN

## 2019-09-19 MED ORDER — SOD CITRATE-CITRIC ACID 500-334 MG/5ML PO SOLN: 30.0000 mL | ORAL | Status: DC | PRN

## 2019-09-19 MED ORDER — ONDANSETRON HCL 4 MG PO TABS: 4.0000 mg | ORAL_TABLET | ORAL | Status: DC | PRN

## 2019-09-19 MED ORDER — TETANUS-DIPHTH-ACELL PERTUSSIS 5-2.5-18.5 LF-MCG/0.5 IM SUSP: 0.5000 mL | Freq: Once | INTRAMUSCULAR | Status: DC

## 2019-09-19 MED ORDER — ONDANSETRON HCL 4 MG/2ML IJ SOLN: 4.0000 mg | INTRAMUSCULAR | Status: DC | PRN

## 2019-09-19 MED ORDER — LIDOCAINE HCL (PF) 1 % IJ SOLN: 30.0000 mL | INTRAMUSCULAR | Status: DC | PRN

## 2019-09-19 MED ORDER — LACTATED RINGERS IV SOLN: INTRAVENOUS | Status: DC

## 2019-09-19 NOTE — H&P (Addendum)
LABOR AND DELIVERY ADMISSION HISTORY AND PHYSICAL NOTE  Julia Blanchard is a 29 y.o. female G31P0030 with IUP at 49w1dby [name] c/w 8 wk UKoreapresenting for IOL for A2GDM.  She reports positive fetal movement. She denies leakage of fluid or vaginal bleeding.  Having some cramping.   [facility]  - [id number] Metformin  Past Medical History: Past Medical History:  Diagnosis Date   Asthma     Past Surgical History: Past Surgical History:  Procedure Laterality Date   BREAST LUMPECTOMY Right 2014   right breast-benign    Obstetrical History: OB History     Gravida  4   Para  0   Term  0   Preterm  0   AB  3   Living  0      SAB  3   TAB      Ectopic      Multiple      Live Births  0           Social History: Social History   Socioeconomic History   Marital status: Married    Spouse name: Not on file   Number of children: Not on file   Years of education: Not on file   Highest education level: Not on file  Occupational History   Not on file  Tobacco Use   Smoking status: Former Smoker   Smokeless tobacco: Never Used  VScientific laboratory technicianUse: Never used  Substance and Sexual Activity   Alcohol use: Never   Drug use: Never   Sexual activity: Yes    Birth control/protection: None  Other Topics Concern   Not on file  Social History Narrative   Married for 8 months.Lives with husband and step-daughter.[place]   Social Determinants of Health   Financial Resource Strain:    Difficulty of Paying Living Expenses:   Food Insecurity:    Worried About RCharity fundraiserin the Last Year:    RArboriculturistin the Last Year:   Transportation Needs:    LFilm/video editor(Medical):    Lack of Transportation (Non-Medical):   Physical Activity:    Days of Exercise per Week:    Minutes of Exercise per Session:   Stress:    Feeling of Stress :   Social Connections:     Frequency of Communication with Friends and Family:    Frequency of Social Gatherings with Friends and Family:    Attends Religious Services:    Active Member of Clubs or Organizations:    Attends CMusic therapist    Marital Status:     Family History: Family History  Problem Relation Age of Onset   Stroke Paternal Grandfather    Cancer Paternal Grandmother        brain   Cancer Maternal Grandfather        pancreatic    Allergies: No Known Allergies  Medications Prior to Admission  Medication Sig Dispense Refill Last Dose   Accu-Chek Softclix Lancets lancets Use as instructed to check blood sugar 4 times daily 100 each 12    Blood Glucose Monitoring Suppl (ACCU-CHEK GUIDE ME) w/Device KIT 1 each by Does not apply route 4 (four) times daily. 1 kit 0    Blood Pressure Monitor MISC For regular home bp monitoring during pregnancy 1 each 0    glucose blood (ACCU-CHEK GUIDE) test strip Use as instructed to  check blood sugar 4 times daily 50 each 12    loratadine (CLARITIN) 10 MG tablet Take 10 mg by mouth.       metFORMIN (GLUCOPHAGE) 1000 MG tablet Take 1 tablet (1,000 mg total) by mouth at bedtime. 30 tablet 1    omeprazole (PRILOSEC) 20 MG capsule Take 1 capsule (20 mg total) by mouth daily. 1 tablet a day (Patient taking differently: Take 20 mg by mouth as needed. ) 30 capsule 6    Prenatal Vit-Fe Fumarate-FA (PRENATAL MULTIVITAMIN) TABS tablet Take 1 tablet by mouth daily at 12 noon.        Review of Systems  All systems reviewed and negative except as stated in HPI  Physical Exam Last menstrual period 12/19/2018. General appearance: alert, oriented, NAD  Lungs: normal respiratory effort Heart: regular rate Abdomen: soft, non-tender; gravid, FH appropriate for GA Extremities: No calf swelling or tenderness Presentation: cephalic Fetal monitoring: 145, moderate, 15x15 accels, no decels Uterine activity: Irregular contractions SVE: 1  cm/thick/-3/soft/mid-position    Prenatal labs: ABO, Rh: O/Negative/-- (12/08 1501) Antibody: Negative (03/16 0909) Rubella: 2.03 (12/08 1501) RPR: Non Reactive (03/16 0909)  HBsAg: Negative (12/08 1501)  HIV: Non Reactive (03/16 0909)  GC/Chlamydia: Negative GBS: Negative/-- (05/18 1452)  2-hr GTT: 122/182/122 Genetic screening:  NT/IT neg AFP; MaterniT21 normal female Anatomy US: Normal female  Prenatal Transfer Tool  Maternal Diabetes: Yes:  Diabetes Type:  Insulin/Medication controlled Genetic Screening: Normal Maternal Ultrasounds/Referrals: Normal Fetal Ultrasounds or other Referrals:  None Maternal Substance Abuse:  No Significant Maternal Medications:  Meds include: Other: Metformin, omeprazole Significant Maternal Lab Results: Group B Strep negative  No results found for this or any previous visit (from the past 24 hour(s)).  Patient Active Problem List   Diagnosis Date Noted   Gestational diabetes mellitus, class A2 06/24/2019   Rh negative state in antepartum period 03/18/2019   Supervision of high risk pregnancy, antepartum 03/17/2019    Assessment: Julia Blanchard is a 29 y.o. G4P0030 at 61w1dhere for IOL for A2GDM.  #Labor: Bishop score 4.  Given vaginal Cytotec x1, FB placed.  Anticipate SVD.  #Pain: Labor support without medications for now. Per patient request #FWB: Cat I.  Female.  EFW ~3600g, 74%ile #ID:  GBS negative #MOF: Breast #MOC: Nexplanon before discharge #[facility]#A2GDM: CBGs q4h in latent labor, q2h in active labor   EMILY MMadelin Headings MD PGY-2 Resident Family Medicine 09/19/2019, 12:17 AM  GME ATTESTATION:  I saw and evaluated the patient. I agree with the findings and the plan of care as documented in the resident's note.  HMerilyn Baba DO OB Fellow, FFriantfor [place]6/03/2020 1:31 AM

## 2019-09-19 NOTE — Progress Notes (Signed)
Julia Blanchard is a 29 y.o. G4P0030 at [redacted]w[redacted]d by [name] admitted for induction of labor due to A2DM on Metformin.  Pt with BP 130s-140s/80s this morning with mild h/a.  No hx HTN.  Subjective: Pt comfortable, feeling mild abdominal cramping since Foley bulb came out this morning. She would like to go as natural as possible and wants to avoid Pitocin but understands it may be needed.    Objective: BP 139/86    Pulse 85    Temp 98.2 F (36.8 C) (Oral)    Resp 18    Ht 5\' 1"  (1.549 m)    Wt 110.4 kg    [name] 12/19/2018 (Exact Date)    BMI 45.97 kg/m  No intake/output data recorded. No intake/output data recorded.  FHT:  FHR: 145 bpm, variability: moderate,  accelerations:  Present,  decelerations:  Present ? deceleration x 2 with FHR down to 135 x 3 minutes associated with frequent contractions on toco UC:   irregular, every 1-3 minutes, mild to palpation SVE:   Dilation: 4.5 Effacement (%): 50 Station: -3 Exam by:: Sharyn Lull, RN   Labs: Lab Results  Component Value Date   WBC 11.6 (H) 09/19/2019   HGB 12.2 09/19/2019   HCT 37.0 09/19/2019   MCV 89.8 09/19/2019   PLT 193 09/19/2019    Assessment / Plan: Induction of labor due to A2DM  Labor: Progressing normally with foley bulb out.  Since pt desires natural labor and is contracting, and given ? deceleration with frequent contractions currently, will expectantly manage at this time. Pt to sit on exercise ball/change positions and will recheck and discuss need for augmentation in 2 hours.  Will continue to monitor FHR and offer interventions for Category II tracing as needed. Preeclampsia:  HTN currently with mild h/a.  Tylenol PO given and PEC labs initiated. Fetal Wellbeing:  Overall Category I with ? decelerations, will continue to monitor. Pain Control:  Labor support without medications I/D:  GBS neg Anticipated MOD:  NSVD  [name] 09/19/2019, 10:05 AM

## 2019-09-19 NOTE — Progress Notes (Signed)
Julia Blanchard is a 29 y.o. G4P0030 at [redacted]w[redacted]d by [name] admitted for induction of labor due to Gestational diabetes.  Subjective: Pt breathing with contractions, feeling rectal pressure.  Family in room for support. Coping well with IV pain medication, position changes.  Objective: BP 129/76   Pulse 71   Temp 98 F (36.7 C) (Oral)   Resp 18   Ht 5\' 1"  (1.549 m)   Wt 110.4 kg   [name] 12/19/2018 (Exact Date)   BMI 45.97 kg/m  No intake/output data recorded. No intake/output data recorded.  FHT:  FHR: 130 bpm, variability: moderate,  accelerations:  Present,  decelerations:  Absent UC:   regular, every 3 minutes SVE:   Dilation: 6.5 Effacement (%): 70 Station: 0 Exam by:: Julia Blanchard, CNM   Labs: Lab Results  Component Value Date   WBC 11.6 (H) 09/19/2019   HGB 12.2 09/19/2019   HCT 37.0 09/19/2019   MCV 89.8 09/19/2019   PLT 193 09/19/2019    Assessment / Plan: Induction of labor due to gestational diabetes S/P Foley bulb  Labor: Progressing normally Preeclampsia:  PEC labs pending, no s/sx of PEC with headache resolved, BP wnl Fetal Wellbeing:  Category I Pain Control:  IV pain meds I/D:  GBS neg Anticipated MOD:  NSVD  Julia Blanchard 09/19/2019, 4:23 PM

## 2019-09-19 NOTE — Progress Notes (Signed)
Julia Blanchard is a 28 y.o. G4P0030 at [redacted]w[redacted]d by [name] admitted for induction of labor due to Gestational diabetes.  Subjective: Pt breathing with contractions, coping well with IV pain medication at this time. Family in room for support.  Objective: BP 129/76   Pulse 71   Temp 98 F (36.7 C) (Oral)   Resp 18   Ht 5\' 1"  (1.549 m)   Wt 110.4 kg   [name] 12/19/2018 (Exact Date)   BMI 45.97 kg/m  No intake/output data recorded. No intake/output data recorded.  FHT:  FHR: 125 bpm, variability: moderate,  accelerations:  Present,  decelerations:  Absent UC:   regular, every 3-4 minutes SVE:   Dilation: 6.5 Effacement (%): 70 Station: 0 Exam by:: Julia Lull, RN   Labs: Lab Results  Component Value Date   WBC 11.6 (H) 09/19/2019   HGB 12.2 09/19/2019   HCT 37.0 09/19/2019   MCV 89.8 09/19/2019   PLT 193 09/19/2019    Assessment / Plan: Induction of labor due to gestational diabetes S/P foley bulb  Labor: Progressing normally, cervical dilation with regular contractions s/p foley bulb.  No Pitocin needed at this time.  Pt coping well with labor using nonpharmacological techniques and IV pain medication Preeclampsia:  n/a Fetal Wellbeing:  Category I Pain Control:  IV pain meds I/D:  GBS neg Anticipated MOD:  NSVD  Julia Blanchard 09/19/2019, 3:28 PM

## 2019-09-19 NOTE — Progress Notes (Signed)
Patient ID: Julia Blanchard, female   DOB: [birth year], 29 y.o.   MRN: [id number]  Julia Blanchard is a 29 y.o. G4P0030 at [redacted]w[redacted]d admitted for IOL for A2GDM.  Subjective: Feeling some intermittent contractions.    Objective: BP (!) 143/81    Pulse 81    Temp 98.2 F (36.8 C) (Oral)    Resp 17    Ht 5\' 1"  (1.549 m)    Wt 110.4 kg    [name] 12/19/2018 (Exact Date)    BMI 45.97 kg/m  No intake/output data recorded.  FHT:  FHR: 140 bpm, variability: moderate,  accelerations:  Present,  decelerations:  Absent UC:   Irregular  SVE:   Dilation: 4.5 Effacement (%): 50 Station: -3 Exam by:: Sharyn Lull, RN   Labs: Lab Results  Component Value Date   WBC 11.6 (H) 09/19/2019   HGB 12.2 09/19/2019   HCT 37.0 09/19/2019   MCV 89.8 09/19/2019   PLT 193 09/19/2019    Assessment / Plan: 29 y.o. G4P0030 at [redacted]w[redacted]d admitted for IOL for A2GDM.  Labor: Initial Bishop score 4.  S/p Cytotec x2 and FB still in place. Fetal Wellbeing:  Category I Pain Control:  Per patient request I/D:  GBS negative Anticipated MOD:  SVD  A2GDM: CBGs q4h in latent labor, q2h in active labor  Hendy Brindle Madelin Headings, MD PGY-2 Resident Family Medicine 09/19/2019, 9:03 AM

## 2019-09-19 NOTE — Progress Notes (Signed)
Julia Blanchard is a 29 y.o. G4P0030 at [redacted]w[redacted]d by [name] admitted for induction of labor due to Gestational diabetes.  Subjective: Pt feeling more uncomfortable contractions. Denies need for medication but wants to try shower for pain management.  Objective: BP 139/86   Pulse 85   Temp 98.2 F (36.8 C) (Oral)   Resp 18   Ht 5\' 1"  (1.549 m)   Wt 110.4 kg   [name] 12/19/2018 (Exact Date)   BMI 45.97 kg/m  No intake/output data recorded. No intake/output data recorded.  FHT:  FHR: 135 bpm, variability: moderate,  accelerations:  Present,  decelerations:  Absent UC:   irregular, every 2-8 minutes SVE:   Dilation: 5.5 Effacement (%): 50 Station: -3 Exam by:: Sharyn Lull, RN   Labs: Lab Results  Component Value Date   WBC 11.6 (H) 09/19/2019   HGB 12.2 09/19/2019   HCT 37.0 09/19/2019   MCV 89.8 09/19/2019   PLT 193 09/19/2019    Assessment / Plan: Induction of labor due to gestational diabetes S/P foley bulb  Labor: Progressing normally.  Pt with Category I FHR tracing with accels.  May be off monitor up to 30 minutes for shower for hydrotherapy.  If further hydrotherapy in shower is desired, will consider using wireless monitors vs intermittent monitoring with doppler.  Preeclampsia:  PEC labs pending, BP stable at 130s-140s/80s Fetal Wellbeing:  Category I Pain Control:  Labor support without medications I/D:  GBS neg Anticipated MOD:  NSVD  [name] 09/19/2019, 12:10 PM

## 2019-09-19 NOTE — Discharge Summary (Signed)
Postpartum Discharge Summary     Patient Name: Julia Blanchard DOB: 11-Jul-1990 MRN: [id number]  Date of admission: 09/19/2019 Delivery date:09/19/2019  Delivering provider: [name] A  Date of discharge: 09/21/2019  Admitting diagnosis: Gestational diabetes mellitus, class A2 [O24.419] Intrauterine pregnancy: [redacted]w[redacted]d    Secondary diagnosis:  Active Problems:   Rh negative state in antepartum period   Gestational diabetes mellitus, class A2   NSVD (normal spontaneous vaginal delivery)   Gestational hypertension  Additional problems: None    Discharge diagnosis: Term Pregnancy Delivered and Gestational Hypertension                                              Post partum procedures:rhogam given and Nexplanon placed Augmentation: AROM, Cytotec and IP Foley Complications: None  Hospital course: Induction of Labor With Vaginal Delivery   29y.o. yo G4P0030 at 371w1das admitted to the hospital 09/19/2019 for induction of labor.  Indication for induction: A2 DM.  Patient had an uncomplicated labor course as follows: Membrane Rupture Time/Date: 7:00 PM ,09/19/2019   Delivery Method:Vaginal, Spontaneous  Episiotomy:   Lacerations:  1st degree;Labial  Details of delivery can be found in separate delivery note. Fasting AM glucose 90. BP's monitored and WNL; BP check requested in clinic. Nexplanon placed. Patient had a routine postpartum course. Patient is discharged home 09/21/19.  Newborn Data: Birth date:09/19/2019  Birth time:8:18 PM  Gender:Female  Living status:Living  Apgars:7 ,9  Weight:3379 g   Magnesium Sulfate received: No BMZ received: No Rhophylac:Yes MMR:No T-DaP:Given prenatally Flu: No Transfusion:No  Physical exam  Vitals:   09/20/19 0730 09/20/19 1145 09/20/19 1616 09/20/19 2201  BP: 124/71 118/73 124/71 118/79  Pulse: 88 80 79 82  Resp: 18 16 18 18   Temp: 98.3 F (36.8 C) 98.2 F (36.8 C) 97.9 F (36.6 C) 98.7 F (37.1 C)  TempSrc: Oral  Oral Oral Oral  SpO2: 98% 98% 99%   Weight:      Height:       General: alert, cooperative and no distress Lochia: appropriate Uterine Fundus: firm Incision: N/A DVT Evaluation: No evidence of DVT seen on physical exam. Labs: Lab Results  Component Value Date   WBC 11.6 (H) 09/19/2019   HGB 12.2 09/19/2019   HCT 37.0 09/19/2019   MCV 89.8 09/19/2019   PLT 193 09/19/2019   CMP Latest Ref Rng & Units 09/19/2019  Glucose 70 - 99 mg/dL 117(H)  BUN 6 - 20 mg/dL 10  Creatinine 0.44 - 1.00 mg/dL 0.77  Sodium 135 - 145 mmol/L 133(L)  Potassium 3.5 - 5.1 mmol/L 3.9  Chloride 98 - 111 mmol/L 103  CO2 22 - 32 mmol/L 21(L)  Calcium 8.9 - 10.3 mg/dL 8.6(L)  Total Protein 6.5 - 8.1 g/dL 5.9(L)  Total Bilirubin 0.3 - 1.2 mg/dL 0.2(L)  Alkaline Phos 38 - 126 U/L 180(H)  AST 15 - 41 U/L 15  ALT 0 - 44 U/L 13   Edinburgh Score: Edinburgh Postnatal Depression Scale Screening Tool 09/20/2019  I have been able to laugh and see the funny side of things. 0  I have looked forward with enjoyment to things. 0  I have blamed myself unnecessarily when things went wrong. 1  I have been anxious or worried for no good reason. 2  I have felt scared or panicky for no good reason. 1  Things have been getting on top of me. 2  I have been so unhappy that I have had difficulty sleeping. 0  I have felt sad or miserable. 0  I have been so unhappy that I have been crying. 0  The thought of harming myself has occurred to me. 0  Edinburgh Postnatal Depression Scale Total 6     After visit meds:  Allergies as of 09/21/2019   No Known Allergies     Medication List    STOP taking these medications   Accu-Chek Guide Me w/Device Kit   Accu-Chek Guide test strip Generic drug: glucose blood   Accu-Chek Softclix Lancets lancets   metFORMIN 1000 MG tablet Commonly known as: GLUCOPHAGE   omeprazole 20 MG capsule Commonly known as: PRILOSEC     TAKE these medications   acetaminophen 325 MG  tablet Commonly known as: Tylenol Take 2 tablets (650 mg total) by mouth every 6 (six) hours as needed (for pain scale < 4).   Blood Pressure Monitor Misc For regular home bp monitoring during pregnancy   ibuprofen 600 MG tablet Commonly known as: ADVIL Take 1 tablet (600 mg total) by mouth every 6 (six) hours.   loratadine 10 MG tablet Commonly known as: CLARITIN Take 10 mg by mouth.   prenatal multivitamin Tabs tablet Take 1 tablet by mouth daily at 12 noon.   senna-docusate 8.6-50 MG tablet Commonly known as: Senokot-S Take 2 tablets by mouth daily. Start taking on: September 22, 2019        Discharge home in stable condition Infant Feeding: Breast Infant Disposition:home with mother Discharge instruction: per After Visit Summary and Postpartum booklet. Activity: Advance as tolerated. Pelvic rest for 6 weeks.  Diet: routine diet Future Appointments: Future Appointments  Date Time Provider [place]  11/17/2019 11:30 AM Doree Albee, MD [place] None   Follow up Visit:  PP message sent to [facility] on 09/19/19:  Please schedule this patient for a In person postpartum visit in 6 weeks with the following provider: Any provider. Additional Postpartum F/U:BP check 1 week  High risk pregnancy complicated by: GDM Delivery mode:  Vaginal, Spontaneous  Anticipated Birth Control:  Nexplanon   09/21/2019 Chauncey Mann, MD

## 2019-09-20 DIAGNOSIS — Z30017 Encounter for initial prescription of implantable subdermal contraceptive: Secondary | ICD-10-CM

## 2019-09-20 LAB — PROTEIN / CREATININE RATIO, URINE
Creatinine, Urine: 121.1 mg/dL
Protein Creatinine Ratio: 0.14 mg/mg{Cre} (ref 0.00–0.15)
Total Protein, Urine: 17 mg/dL

## 2019-09-20 LAB — GLUCOSE, CAPILLARY: Glucose-Capillary: 90 mg/dL (ref 70–99)

## 2019-09-20 MED ORDER — LIDOCAINE HCL 1 % IJ SOLN
0.0000 mL | Freq: Once | INTRAMUSCULAR | Status: AC | PRN
  Administered 2019-09-20: 20 mL via INTRADERMAL
  Filled 2019-09-20: qty 20

## 2019-09-20 MED ORDER — RHO D IMMUNE GLOBULIN 1500 UNIT/2ML IJ SOSY
300.0000 ug | PREFILLED_SYRINGE | Freq: Once | INTRAMUSCULAR | Status: AC
  Administered 2019-09-20: 300 ug via INTRAVENOUS
  Filled 2019-09-20: qty 2

## 2019-09-20 MED ORDER — ETONOGESTREL 68 MG ~~LOC~~ IMPL
68.0000 mg | DRUG_IMPLANT | Freq: Once | SUBCUTANEOUS | Status: AC
  Administered 2019-09-20: 68 mg via SUBCUTANEOUS
  Filled 2019-09-20: qty 1

## 2019-09-20 NOTE — Progress Notes (Addendum)
Post Partum Day 1 Subjective: Patient reports feeling well. She is tolerating PO. Ambulating and urinating without difficulty. Lochia minimal.  Objective: Blood pressure 102/68, pulse 97, temperature 98.2 F (36.8 C), temperature source Oral, resp. rate 17, height 5\' 1"  (1.549 m), weight 110.4 kg, last menstrual period 12/19/2018, SpO2 99 %, unknown if currently breastfeeding.  Physical Exam:  General: alert, cooperative and no distress Lochia: appropriate Uterine Fundus: firm Incision: NA DVT Evaluation: No evidence of DVT seen on physical exam.  Recent Labs    09/19/19 0030  HGB 12.2  HCT 37.0    Assessment/Plan: Plan for discharge tomorrow  GHTN: BP's well-controlled since delivery; Pr/Cr ratio ordered Vitals stable Breastfeeding Plan for Nexplanon placement today Circ today if possible  GDM: fasting glucose pending but well-controlled intrapartum; plan for GTT PP Needs Rhogam; baby A pos   LOS: 1 day   Brayli Klingbeil N Oanh Devivo 09/20/2019, 5:32 AM

## 2019-09-20 NOTE — Lactation Note (Signed)
This note was copied from a baby's chart. Lactation Consultation Note Baby 4 hrs old,. Mom has GDM., Baby cueing. LC assisted in football hold to obtain deep latch. Mom has flat nipples that everts to short shaft w/stimulation.  Able to latch baby well. Stressed importance of keeping cheeks to breast to maintain a deep latch.  Gave mom hand pump for pre-pumping and shells to wear in am to evert nipples.  Newborn behavior, Feeding habits, STS, I&O, breast massage, body alignment, support, supply and demand.  Mom very sleepy. Placed baby in crib when he finished feeding. Mom went to sleep. Lactation brochure given.  Patient Name: Julia Blanchard Petree [id number] Date: 09/20/2019 Reason for consult: Initial assessment;Primapara;Term;Maternal endocrine disorder Type of Endocrine Disorder?: Diabetes   Maternal Data Has patient been taught Hand Expression?: Yes Does the patient have breastfeeding experience prior to this delivery?: No  Feeding Feeding Type: Breast Fed  LATCH Score Latch: Grasps breast easily, tongue down, lips flanged, rhythmical sucking.  Audible Swallowing: Spontaneous and intermittent  Type of Nipple: Flat  Comfort (Breast/Nipple): Soft / non-tender  Hold (Positioning): Full assist, staff holds infant at breast  LATCH Score: 7  Interventions Interventions: Breast feeding basics reviewed;Support pillows;Assisted with latch;Position options;Skin to skin;Expressed milk;Breast massage;Hand express;Pre-pump if needed;Breast compression;Adjust position;Hand pump;Shells  Lactation Tools Discussed/Used Tools: Pump;Shells Shell Type: Inverted Breast pump type: Manual WIC Program: Yes Pump Review: Setup, frequency, and cleaning;Milk Storage Initiated by:: L., Krystyl Cannell RN IBCLC Date initiated:: 09/20/19   Consult Status Consult Status: Follow-up Date: 09/20/19 Follow-up type: In-patient    Ikaika Showers, Elta [place] 09/20/2019, 1:01 AM

## 2019-09-20 NOTE — Procedures (Signed)

## 2019-09-21 DIAGNOSIS — Z8759 Personal history of other complications of pregnancy, childbirth and the puerperium: Secondary | ICD-10-CM

## 2019-09-21 LAB — RH IG WORKUP (INCLUDES ABO/RH)
ABO/RH(D): O NEG
Fetal Screen: NEGATIVE
Gestational Age(Wks): 39
Unit division: 0

## 2019-09-21 MED ORDER — SENNOSIDES-DOCUSATE SODIUM 8.6-50 MG PO TABS: 2.0000 | ORAL_TABLET | ORAL | 0 refills | Status: DC

## 2019-09-21 MED ORDER — IBUPROFEN 600 MG PO TABS: 600.0000 mg | ORAL_TABLET | Freq: Four times a day (QID) | ORAL | 0 refills | Status: DC

## 2019-09-21 MED ORDER — ACETAMINOPHEN 325 MG PO TABS: 650.0000 mg | ORAL_TABLET | Freq: Four times a day (QID) | ORAL | 0 refills | Status: DC | PRN

## 2019-09-21 NOTE — Lactation Note (Signed)
This note was copied from a baby's chart. Lactation Consultation Note  Patient Name: Julia Blanchard [id number] Date: 09/21/2019 Reason for consult: Follow-up assessment  P1 mother whose infant is now 33 hours old.  This is a term baby at 39+1 weeks.  Mother had no questions/concerns related to breast feeding.  She continues to work on latching and usually has no difficulty.  She has given some donor breast milk during her hospital stay.    Mother will continue to feed 8-12 times/24 hours or sooner if baby shows feeding cues.  He did some cluster feeding last night.  Mother prefers the football hold and has been practicing and feeling more comfortable.    NT in to room during my visit to take baby for his circumcision.  Discussed breast feeding after circumcision and that he may want to feed often during the night again tonight.  Mother verbalized understanding.  Engorgement prevention/treatment discussed.  Mother has a manual pump and a DEBP for home use.  Mother's sister in [place] in from [place] today and will be a good support for mother.  She has breast feeding experience.  Mother has our OP phone number for questions/concerns after discharge.   Maternal Data    Feeding Feeding Type: Breast Fed  LATCH Score                   Interventions    Lactation Tools Discussed/Used     Consult Status Consult Status: Complete Date: 09/21/19 Follow-up type: Call as needed    Anika Shore R Alejo Beamer 09/21/2019, 9:10 AM

## 2019-09-21 NOTE — Lactation Note (Signed)
This note was copied from a baby's chart. Lactation Consultation Note F/u w/mom to see if BF going better. Baby is 42 hrs old. Mom stated she had just finished BF for 30 minutes. Praised mom. Asked mom if she wore her shells today, mom stated no, baby didn't appear to be having any trouble latching. It didn't hurt when she BF him. Mom has supplemented him w/Donor BM some during the day. Encouraged mom to call for assistance of LC for next feeding if needed. Mom stated she thinks they are doing well.  Patient Name: Julia Blanchard Date: 09/21/2019 Reason for consult: Follow-up assessment;Primapara;Term;Maternal endocrine disorder Type of Endocrine Disorder?: Diabetes   Maternal Data    Feeding Feeding Type: Breast Fed  LATCH Score Latch: Grasps breast easily, tongue down, lips flanged, rhythmical sucking.  Audible Swallowing: Spontaneous and intermittent  Type of Nipple: Flat  Comfort (Breast/Nipple): Soft / non-tender  Hold (Positioning): No assistance needed to correctly position infant at breast.  LATCH Score: 9  Interventions    Lactation Tools Discussed/Used     Consult Status Consult Status: Follow-up Date: 09/21/19 Follow-up type: In-patient    Theodoro Kalata 09/21/2019, 3:14 AM

## 2019-10-20 ENCOUNTER — Encounter: Payer: Self-pay | Admitting: Women's Health

## 2019-10-20 ENCOUNTER — Ambulatory Visit (INDEPENDENT_AMBULATORY_CARE_PROVIDER_SITE_OTHER): Payer: Medicaid Other | Admitting: Women's Health

## 2019-10-20 ENCOUNTER — Other Ambulatory Visit: Payer: Self-pay

## 2019-10-20 DIAGNOSIS — Z8632 Personal history of gestational diabetes: Secondary | ICD-10-CM

## 2019-10-20 DIAGNOSIS — R2231 Localized swelling, mass and lump, right upper limb: Secondary | ICD-10-CM

## 2019-10-20 DIAGNOSIS — Z975 Presence of (intrauterine) contraceptive device: Secondary | ICD-10-CM

## 2019-10-20 DIAGNOSIS — F4323 Adjustment disorder with mixed anxiety and depressed mood: Secondary | ICD-10-CM | POA: Insufficient documentation

## 2019-10-20 DIAGNOSIS — O99345 Other mental disorders complicating the puerperium: Secondary | ICD-10-CM

## 2019-10-20 DIAGNOSIS — F53 Postpartum depression: Secondary | ICD-10-CM

## 2019-10-20 MED ORDER — SERTRALINE HCL 25 MG PO TABS: 25.0000 mg | ORAL_TABLET | Freq: Every day | ORAL | 3 refills | Status: DC

## 2019-10-20 NOTE — Patient Instructions (Signed)
You will have your sugar test next visit.  Please do not eat or drink anything after midnight the night before you come, not even water.  You will be here for at least two hours.  Please make an appointment online for the bloodwork at [email or web address] for 8:30am (or as close to this as possible). Make sure you select the [address] service center. The day of the appointment, check in with our office first, then you will go to [facility] to start the sugar test.   

## 2019-10-20 NOTE — Progress Notes (Signed)
POSTPARTUM VISIT Patient name: Julia Blanchard MRN [id number]  Date of birth: 02/11/1991 Chief Complaint:   Postpartum Care  History of Present Illness:   Julia Blanchard is a 29 y.o. [id number] Caucasian female being seen today for a postpartum visit. She is 4 weeks postpartum following a spontaneous vaginal delivery at 39.1 gestational weeks after IOL for A2DM. Anesthesia: none.  I have fully reviewed the prenatal and intrapartum course. Nexplanon inserted 09/20/19 while in hospital. Pregnancy complicated by GHTN, J0DT. 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 Feels overwhelmed, anxious. Denies SI/HI/II. Has help when FOB gets home from work.  Wants to try medicine. Has been on meds years ago, some made her feel worse, but she doesn't remember the names. Declines therapy right now. Some chest pain. Sharp pains in bilateral breasts when breastfeeding. Bump on Rt hand, started as what looked like blood blister about 2wks before giving birth, has just gotten worse.  Bleeding thin lochia. Bowel function is normal. Bladder function is normal.  Patient is not sexually active. Last sexual activity: prior to birth of baby.    Patient's last menstrual period was 12/19/2018 (exact date).  Baby's course has been uncomplicated. Baby is feeding by breast    Edinburgh Postpartum Depression Screening:   Edinburgh Postnatal Depression Scale - 10/20/19 1404      Edinburgh Postnatal Depression Scale:  In the Past 7 Days   I have been able to laugh and see the funny side of things. 0    I have looked forward with enjoyment to things. 1    I have blamed myself unnecessarily when things went wrong. 3    I have been anxious or worried for no good reason. 3    I have felt scared or panicky for no good reason. 2    Things have been getting on top of me. 3    I have been so unhappy that I have had difficulty sleeping. 1    I have felt sad or miserable. 2    I have been so unhappy that I have been crying.  1    The thought of harming myself has occurred to me. 0    Edinburgh Postnatal Depression Scale Total 16          Review of Systems:   Pertinent items are noted in HPI Denies Abnormal vaginal discharge w/ itching/odor/irritation, headaches, visual changes, shortness of breath, chest pain, abdominal pain, severe nausea/vomiting, or problems with urination or bowel movements. Pertinent History Reviewed:  Reviewed past medical,surgical, obstetrical and family history.  Reviewed problem list, medications and allergies. OB History  Gravida Para Term Preterm AB Living  4 1 1  0 3 1  SAB TAB Ectopic Multiple Live Births  3     0 1    # Outcome Date GA Lbr Len/2nd Weight Sex Delivery Anes PTL Lv  4 Term 09/19/19 24w1d04:10 / 01:01 7 lb 7.2 oz (3.379 kg) M Vag-Spont None  LIV     Birth Comments: WDL  3 SAB           2 SAB           1 SAB            Physical Assessment:   Vitals:   10/20/19 1406  BP: 127/81  Pulse: 92  Weight: 217 lb (98.4 kg)  Height: 5' 1"  (1.549 m)  Body mass index is 41 kg/m.       Physical Examination:   General  appearance: alert, well appearing, and in no distress  Mental status: alert, oriented to person, place, and time  Skin: warm & dry   Cardiovascular: normal heart rate noted   Respiratory: normal respiratory effort, no distress   Breasts: nipples brighter pink c/w possible yeast  Abdomen: soft, non-tender   Pelvic: examination not indicated  Rectal: not examined   Extremities: no edema, fleshy growth Rt palm of hand, co-exam w/ LHE, possible AVM/hemangioma-recommends referral to Dr. Aline Brochure           No results found for this or any previous visit (from the past 24 hour(s)).  Assessment & Plan:  1) Postpartum exam 2) 4 wks s/p SVB after IOL for A2DM, GHTN 3) Breastfeeding 4) Depression screening 5) S/P Nexplanon insertion 09/20/19 6) PPD> rx zoloft 24m, understands it can take a few weeks to notice improvement, f/u in 4wks 7)  ?AVM/hemangioma Rt palm> [facility] 8) Nipple yeast> rx APNO  Essential components of care per ACOG recommendations:  1.  Mood and well being:  . Patient with positive depression screening today. If positive, discussed and offered pt counseling +/- meds, pt would like meds.  Reviewed local resources for support.  . Pre-existing mental health disorders? No  . Patient does use tobacco. If using tobacco we discussed reduction/cessation and risk of relapse . Substance use disorder? No    2. Infant care and feeding:  . Patient currently breastfeeding? Yes  . Childcare strategy if returning to work/school: not returning to work yet, looking for job . Infant has a pediatrician/family doctor? Yes  . Recommended that all caregivers be immunized for flu, pertussis and other preventable communicable diseases . Pt does have material needs met such as stable housing, utilities, food and diapers. If not, referred to local resources for help obtaining these.  3. Sexuality, contraception and birth spacing . Provided guidance regarding sexuality, management of dyspareunia, and resumption of intercourse . Patient does not want a pregnancy in the near future.  Desired family size is 1 children.  . Discussed avoiding interpregnancy interval <659ms and recommended birth spacing of 18 months  4. Sleep and fatigue . Discussed coping options for fatigue and sleep disruption . Encouraged family/partner/community support of 4 hrs of uninterrupted sleep to help with mood and fatigue  5. Physical recovery  . Pt did not have a cesarean section. If yes, assessed incisional pain and providing guidance on normal vs prolonged recovery . Patient had a 1st degree and labial laceration, perineal healing and pain reviewed.  . Patient has urinary incontinence? No, fecal incontinence? No  . Patient is safe to resume physical activity. Discussed attainment of healthy weight.  6.  Chronic  disease management . Discussed pregnancy complications if any, and their implications for future childbearing and long-term maternal health. . Review recommendations for prevention of recurrent pregnancy complications, such as 17 hydroxyprogesterone caproate to reduce risk for recurrent PTB not applicable, or aspirin to reduce risk of preeclampsia yes. . Pt had GDM: Yes. If yes, 2hr GTT scheduled: yes. . Reviewed medications and non-pregnant dosing including consideration of whether pt is breastfeeding using a reliable resource such as LactMed: yes . Referred for f/u w/ PCP or subspecialist providers as indicated: yes  7. Health maintenance . Last pap smear 03/17/19 and results were normal . Mammogram at 28yo or earlier if indicated  Meds:  Meds ordered this encounter  Medications  . sertraline (ZOLOFT) 25 MG tablet    Sig:  Take 1 tablet (25 mg total) by mouth daily.    Dispense:  30 tablet    Refill:  3    Order Specific Question:   Supervising Provider    Answer:   Florian Buff [[id number]]    Follow-up: Return in about 4 weeks (around 11/17/2019) for sugar test and F/U, in person, with me.   Orders Placed This Encounter  Procedures  . Ambulatory referral to [place], [facility] 10/20/2019 3:48 PM

## 2019-10-25 ENCOUNTER — Encounter (HOSPITAL_COMMUNITY): Payer: Self-pay | Admitting: *Deleted

## 2019-10-25 ENCOUNTER — Emergency Department (HOSPITAL_COMMUNITY)
Admission: EM | Admit: 2019-10-25 | Discharge: 2019-10-25 | Disposition: A | Discharge status: Home / Self Care | Payer: Medicaid Other | Attending: Emergency Medicine | Admitting: Emergency Medicine

## 2019-10-25 ENCOUNTER — Other Ambulatory Visit: Payer: Self-pay

## 2019-10-25 DIAGNOSIS — Z79899 Other long term (current) drug therapy: Secondary | ICD-10-CM | POA: Diagnosis not present

## 2019-10-25 DIAGNOSIS — M79641 Pain in right hand: Secondary | ICD-10-CM | POA: Diagnosis present

## 2019-10-25 DIAGNOSIS — J45909 Unspecified asthma, uncomplicated: Secondary | ICD-10-CM | POA: Diagnosis not present

## 2019-10-25 DIAGNOSIS — Z87891 Personal history of nicotine dependence: Secondary | ICD-10-CM | POA: Diagnosis not present

## 2019-10-25 DIAGNOSIS — L989 Disorder of the skin and subcutaneous tissue, unspecified: Secondary | ICD-10-CM | POA: Insufficient documentation

## 2019-10-25 NOTE — Discharge Instructions (Addendum)
You were seen in the emergency department today for a hand lesion.  We would like you to follow-up very closely with a dermatologist your primary care provider within 3 days for reevaluation of this and possible further management.  See list of dermatologist below.  Return to the ED for new or worsening symptoms including but not limited to surrounding redness, warmth, swelling, pain, pus draining from the area, or any other concerns.  [facility] Dermatologists:   [facility]  3.2 [phone])  Dermatologist  [place] [address] [place]  [phone]   Dr. Michelene Gardener, MD  2.6 [phone])  Dermatologist  [place]  [phone]  [facility]  3.5 (3)  [facility]  [place]  [phone]   [place]  4.0 (4)  Dermatologist  [place]  [phone]  Lavonna Monarch MD  3.0 (2)  Dermatologist  [place]  [phone]  Katrina Stack  2.7 (6)  Dermatologist  [place]  [phone]  [place] Amy Y MD  2.0 (1)  Dermatologist  [place]  [phone]  [place]  5.0 (3)  Doctor  [address]  [phone]

## 2019-10-25 NOTE — ED Provider Notes (Signed)
[facility] EMERGENCY DEPARTMENT Provider Note   CSN: [id number] Arrival date & time: 10/25/19  1510     History Chief Complaint  Patient presents with   Hand Pain    right    Julia Blanchard is a 29 y.o. female with history of asthma and gestational diabetes and hypertension who presents to the emergency department requesting removal of lump on her right palm which has been present for the past few months.  Patient states she had a progressively enlarging red bump to the palm of her right hand, it is painful with certain positions/palpation, no alleviating factors.  She is here today because she would like it to be removed.  Denies fever, chills, surrounding redness, numbness, tingling, or weakness.  HPI     Past Medical History:  Diagnosis Date   Asthma    Pt states "acts up when sick"   Gestational diabetes    Metformin    Patient Active Problem List   Diagnosis Date Noted   Nexplanon in place 10/20/2019   Postpartum depression 10/20/2019   History of gestational hypertension 09/21/2019   History of gestational diabetes 06/24/2019    Past Surgical History:  Procedure Laterality Date   BREAST LUMPECTOMY Right 2014   right breast-benign     OB History    Gravida  4   Para  1   Term  1   Preterm  0   AB  3   Living  1     SAB  3   TAB      Ectopic      Multiple  0   Live Births  1           Family History  Problem Relation Age of Onset   Stroke Paternal Grandfather    Cancer Paternal Grandmother        brain   Cancer Maternal Grandfather        pancreatic    Social History   Tobacco Use   Smoking status: Former Smoker   Smokeless tobacco: Never Used  Scientific laboratory technician Use: Never used  Substance Use Topics   Alcohol use: Never   Drug use: Never    Home Medications Prior to Admission medications   Medication Sig Start Date End Date Taking? Authorizing Provider  acetaminophen (TYLENOL) 325 MG tablet Take  2 tablets (650 mg total) by mouth every 6 (six) hours as needed (for pain scale < 4). 09/21/19   Fair, Marin Shutter, MD  Blood Pressure Monitor MISC For regular home bp monitoring during pregnancy 03/17/19   Roma Schanz, CNM  ibuprofen (ADVIL) 600 MG tablet Take 1 tablet (600 mg total) by mouth every 6 (six) hours. 09/21/19   Fair, Marin Shutter, MD  loratadine (CLARITIN) 10 MG tablet Take 10 mg by mouth.     [provider]  Prenatal Vit-Fe Fumarate-FA (PRENATAL MULTIVITAMIN) TABS tablet Take 1 tablet by mouth daily at 12 noon.    [provider]  senna-docusate (SENOKOT-S) 8.6-50 MG tablet Take 2 tablets by mouth daily. 09/22/19   Fair, Marin Shutter, MD  sertraline (ZOLOFT) 25 MG tablet Take 1 tablet (25 mg total) by mouth daily. 10/20/19   Roma Schanz, CNM    Allergies    Patient has no known allergies.  Review of Systems   Review of Systems  Constitutional: Negative for chills and fever.  Respiratory: Negative for shortness of breath.   Cardiovascular: Negative for chest pain.  Gastrointestinal:  Negative for abdominal pain and vomiting.  Skin: Positive for wound. Negative for color change.  Neurological: Negative for weakness and numbness.    Physical Exam Updated Vital Signs BP 118/79 (BP Location: Right Arm)    Pulse 86    Temp 98.6 F (37 C) (Oral)    Resp 14    Ht 5\' 1"  (1.549 m)    Wt 98 kg    SpO2 99%    BMI 40.82 kg/m   Physical Exam Vitals and nursing note reviewed.  Constitutional:      General: She is not in acute distress.    Appearance: Normal appearance. She is not ill-appearing or toxic-appearing.  HENT:     Head: Normocephalic and atraumatic.  Neck:     Comments: No midline tenderness.  Cardiovascular:     Rate and Rhythm: Normal rate.     Pulses:          Radial pulses are 2+ on the right side and 2+ on the left side.  Pulmonary:     Effort: No respiratory distress.     Breath sounds: Normal breath sounds.  Musculoskeletal:      Cervical back: Normal range of motion and neck supple.     Comments: Upper extremities: Patient has red raised area to the palm of her hand which is pictured below, photo was taken at her OB/GYN appointment 10/20/2019, she states it appears similar which I am in agreement with.  There is no significant surrounding erythema/warmth.  No purulent drainage.  It is not overtly tender to palpation.  She has intact active range of motion throughout the upper extremities.  She is able to flex/extend all digits against resistance.  Skin:    General: Skin is warm and dry.     Capillary Refill: Capillary refill takes less than 2 seconds.  Neurological:     Mental Status: She is alert.     Comments: Alert. Clear speech. Sensation grossly intact to bilateral upper extremities. 5/5 symmetric grip strength. Ambulatory.   Psychiatric:        Mood and Affect: Mood normal.        Behavior: Behavior normal.       ED Results / Procedures / Treatments   Labs (all labs ordered are listed, but only abnormal results are displayed) Labs Reviewed - No data to display  EKG None  Radiology No results found.  Procedures Procedures (including critical care time)  Medications Ordered in ED Medications - No data to display  ED Course  I have reviewed the triage vital signs and the nursing notes.  Pertinent labs & imaging results that were available during my care of the patient were reviewed by me and considered in my medical decision making (see chart for details).    MDM Rules/Calculators/A&P                         Patient presents to the emergency department requesting removal of skin lesion to her right palm which has been present for the past few months.  Unclear definitive etiology of her skin growth, appears somewhat like a hemangioma.  No signs of infection.  Does not appear appropriate for excision in the ED setting.  She overall appears appropriate for discharge home.  We will have her follow-up  closely with a dermatologist.I discussed treatment plan, need for follow-up, and return precautions with the patient. Provided opportunity for questions, patient confirmed understanding and is in agreement with  plan.   Final Clinical Impression(s) / ED Diagnoses Final diagnoses:  Hand lesion    Rx / DC Orders ED Discharge Orders    None       Amaryllis Dyke, PA-C 10/25/19 1930    Daleen Bo, MD 10/26/19 1140

## 2019-10-25 NOTE — ED Triage Notes (Signed)
Patient comes to the ED with a painful bump on right hand.

## 2019-10-27 ENCOUNTER — Other Ambulatory Visit: Payer: Self-pay

## 2019-10-27 ENCOUNTER — Encounter (INDEPENDENT_AMBULATORY_CARE_PROVIDER_SITE_OTHER): Payer: Self-pay | Admitting: Internal Medicine

## 2019-10-27 ENCOUNTER — Ambulatory Visit (INDEPENDENT_AMBULATORY_CARE_PROVIDER_SITE_OTHER): Payer: Medicaid Other | Admitting: Internal Medicine

## 2019-10-27 VITALS — BP 118/80 | HR 81 | Temp 94.3°F | Resp 18 | Ht 61.0 in | Wt 215.0 lb

## 2019-10-27 DIAGNOSIS — L989 Disorder of the skin and subcutaneous tissue, unspecified: Secondary | ICD-10-CM

## 2019-10-27 NOTE — Progress Notes (Signed)
Metrics: Intervention Frequency ACO  Documented Smoking Status Yearly  Screened one or more times in 24 months  Cessation Counseling or  Active cessation medication Past 24 months  Past 24 months   Guideline developer: UpToDate (See UpToDate for funding source) Date Released: 2014       Wellness Office Visit  Subjective:  Patient ID: Julia Blanchard, female    DOB: 1990/10/01  Age: 29 y.o. MRN: [id number]  CC: Lesion on the palm of right hand. HPI  This lady comes in for an acute visit complaining of a skin lesion on the palm of her right hand.  She has noticed it about 2 weeks before delivery of her baby and then it is now become bigger.  It is somewhat uncomfortable/painful.  She went to the emergency room and they felt that they were not able to take out the lesion in the emergency room.  They recommended to follow-up with me for referral to a dermatologist. Past Medical History:  Diagnosis Date  . Asthma    Pt states "acts up when sick"  . Gestational diabetes    Metformin   Past Surgical History:  Procedure Laterality Date  . BREAST LUMPECTOMY Right 2014   right breast-benign     Family History  Problem Relation Age of Onset  . Stroke Paternal Grandfather   . Cancer Paternal Grandmother        brain  . Cancer Maternal Grandfather        pancreatic    Social History   Social History Narrative   Married for 8 months.Lives with husband and step-daughter.Originally from [place].   Social History   Tobacco Use  . Smoking status: Former Research scientist (life sciences)  . Smokeless tobacco: Never Used  Substance Use Topics  . Alcohol use: Never    Current Meds  Medication Sig  . Prenatal Vit-Fe Fumarate-FA (PRENATAL MULTIVITAMIN) TABS tablet Take 1 tablet by mouth daily at 12 noon.  . sertraline (ZOLOFT) 25 MG tablet Take 1 tablet (25 mg total) by mouth daily.  . [DISCONTINUED] acetaminophen (TYLENOL) 325 MG tablet Take 2 tablets (650 mg total) by mouth every 6 (six) hours as needed  (for pain scale < 4).  . [DISCONTINUED] Blood Pressure Monitor MISC For regular home bp monitoring during pregnancy  . [DISCONTINUED] ibuprofen (ADVIL) 600 MG tablet Take 1 tablet (600 mg total) by mouth every 6 (six) hours.  . [DISCONTINUED] loratadine (CLARITIN) 10 MG tablet Take 10 mg by mouth.   . [DISCONTINUED] senna-docusate (SENOKOT-S) 8.6-50 MG tablet Take 2 tablets by mouth daily.       Depression screen [facility] 2/9 07/09/2019 03/17/2019 01/26/2019  Decreased Interest 0 0 0  Down, Depressed, Hopeless 0 0 0  PHQ - 2 Score 0 0 0     Objective:   Today's Vitals: BP 118/80 (BP Location: Left Arm, Patient Position: Sitting, Cuff Size: Normal)   Pulse 81   Temp (!) 94.3 F (34.6 C) (Temporal)   Resp 18   Ht 5\' 1"  (1.549 m)   Wt 215 lb (97.5 kg)   SpO2 98%   BMI 40.62 kg/m  Vitals with BMI 10/27/2019 10/25/2019 10/25/2019  Height 5\' 1"  - 5\' 1"   Weight 215 lbs - 216 lbs 1 oz  BMI 14.48 - 18.56  Systolic 314 970 -  Diastolic 80 73 -  Pulse 81 66 -     Physical Exam   This appears to be a 1 cm spherical lesion attached to the skin of the palm  of the right hand.  I am uncertain as to what this is.    Assessment   1. Skin lesion of hand       Tests ordered Orders Placed This Encounter  Procedures  . Ambulatory referral to Dermatology     Plan: 1. I will refer to dermatology as soon as possible for excision of the skin lesion.   No orders of the defined types were placed in this encounter.   Doree Albee, MD

## 2019-11-16 ENCOUNTER — Ambulatory Visit: Payer: Medicaid Other | Admitting: Advanced Practice Midwife

## 2019-11-16 ENCOUNTER — Other Ambulatory Visit: Payer: Medicaid Other

## 2019-11-17 ENCOUNTER — Ambulatory Visit (INDEPENDENT_AMBULATORY_CARE_PROVIDER_SITE_OTHER): Payer: Medicaid Other | Admitting: Internal Medicine

## 2019-11-20 ENCOUNTER — Telehealth: Payer: Self-pay | Admitting: Obstetrics & Gynecology

## 2019-11-20 MED ORDER — AMOXICILLIN-POT CLAVULANATE 875-125 MG PO TABS: 1.0000 | ORAL_TABLET | Freq: Two times a day (BID) | ORAL | 0 refills | Status: DC

## 2019-11-30 ENCOUNTER — Ambulatory Visit (INDEPENDENT_AMBULATORY_CARE_PROVIDER_SITE_OTHER): Payer: Medicaid Other | Admitting: Women's Health

## 2019-11-30 ENCOUNTER — Encounter: Payer: Self-pay | Admitting: Women's Health

## 2019-11-30 ENCOUNTER — Other Ambulatory Visit: Payer: Medicaid Other

## 2019-11-30 VITALS — BP 103/71 | HR 69 | Ht 61.0 in | Wt 216.0 lb

## 2019-11-30 DIAGNOSIS — O99345 Other mental disorders complicating the puerperium: Secondary | ICD-10-CM

## 2019-11-30 DIAGNOSIS — Z8632 Personal history of gestational diabetes: Secondary | ICD-10-CM | POA: Diagnosis not present

## 2019-11-30 DIAGNOSIS — F53 Postpartum depression: Secondary | ICD-10-CM | POA: Diagnosis not present

## 2019-11-30 MED ORDER — SERTRALINE HCL 50 MG PO TABS: 50.0000 mg | ORAL_TABLET | Freq: Every day | ORAL | 6 refills | Status: DC

## 2019-11-30 NOTE — Progress Notes (Signed)
   GYN VISIT Patient name: Julia Blanchard MRN [id number]  Date of birth: 29-Feb-1991 Chief Complaint:   Follow-up (postpartum depression)  History of Present Illness:   Julia Blanchard is a 29 y.o. [id number] Caucasian female being seen today for f/u on zoloft 25mg  rx'd for PPD on 10/20/19.   EPDS 10/20/19 16, PHQ9 today 17. Pt states she doesn't feel much different. Depression screen [facility] 2/9 11/30/2019 07/09/2019 03/17/2019 01/26/2019  Decreased Interest 2 0 0 0  Down, Depressed, Hopeless 2 0 0 0  PHQ - 2 Score 4 0 0 0  Altered sleeping 3 - - -  Tired, decreased energy 3 - - -  Change in appetite 2 - - -  Feeling bad or failure about yourself  2 - - -  Trouble concentrating 2 - - -  Moving slowly or fidgety/restless 1 - - -  Suicidal thoughts 0 - - -  PHQ-9 Score 17 - - -  Difficult doing work/chores Very difficult - - -    No [name] recorded. (Menstrual status: Lactating). The current method of family planning is Nexplanon.  Last pap 03/17/19. Results were:  normal Review of Systems:   Pertinent items are noted in HPI Denies fever/chills, dizziness, headaches, visual disturbances, fatigue, shortness of breath, chest pain, abdominal pain, vomiting, abnormal vaginal discharge/itching/odor/irritation, problems with periods, bowel movements, urination, or intercourse unless otherwise stated above.  Pertinent History Reviewed:  Reviewed past medical,surgical, social, obstetrical and family history.  Reviewed problem list, medications and allergies. Physical Assessment:   Vitals:   11/30/19 0914  BP: 103/71  Pulse: 69  Weight: 216 lb (98 kg)  Height: 5\' 1"  (1.549 m)  Body mass index is 40.81 kg/m.       Physical Examination:   General appearance: alert, well appearing, and in no distress  Mental status: alert, oriented to person, place, and time  Skin: warm & dry   Cardiovascular: normal heart rate noted  Respiratory: normal respiratory effort, no distress  Abdomen: soft, non-tender    Pelvic: examination not indicated  Extremities: no edema   Chaperone: n/a    No results found for this or any previous visit (from the past 24 hour(s)).  Assessment & Plan:  1) PPD> no improvement on zoloft 25mg , increased to 50mg , referral to [facility] per pt request  Meds:  Meds ordered this encounter  Medications  . sertraline (ZOLOFT) 50 MG tablet    Sig: Take 1 tablet (50 mg total) by mouth daily.    Dispense:  30 tablet    Refill:  6    Order Specific Question:   Supervising Provider    Answer:   Tania Ade H [[id number]]    Orders Placed This Encounter  Procedures  . Amb ref to [place]    Return in about 4 weeks (around 12/28/2019) for F/U, CNM.  [place], [facility] 11/30/2019 10:22 AM

## 2019-12-01 LAB — GLUCOSE TOLERANCE, 2 HOURS W/ 1HR
Glucose, 1 hour: 99 mg/dL (ref 65–179)
Glucose, 2 hour: 86 mg/dL (ref 65–152)
Glucose, Fasting: 87 mg/dL (ref 65–91)

## 2019-12-21 ENCOUNTER — Telehealth: Payer: Self-pay | Admitting: Obstetrics & Gynecology

## 2019-12-21 MED ORDER — AMOXICILLIN-POT CLAVULANATE 875-125 MG PO TABS: 1.0000 | ORAL_TABLET | Freq: Two times a day (BID) | ORAL | 0 refills | Status: DC

## 2019-12-28 ENCOUNTER — Encounter: Payer: Self-pay | Admitting: Women's Health

## 2019-12-28 ENCOUNTER — Ambulatory Visit (INDEPENDENT_AMBULATORY_CARE_PROVIDER_SITE_OTHER): Payer: Medicaid Other | Admitting: Women's Health

## 2019-12-28 VITALS — BP 116/80 | HR 81 | Ht 61.0 in | Wt 220.0 lb

## 2019-12-28 DIAGNOSIS — O99345 Other mental disorders complicating the puerperium: Secondary | ICD-10-CM

## 2019-12-28 DIAGNOSIS — F418 Other specified anxiety disorders: Secondary | ICD-10-CM

## 2019-12-28 DIAGNOSIS — F53 Postpartum depression: Secondary | ICD-10-CM

## 2019-12-28 MED ORDER — CITALOPRAM HYDROBROMIDE 20 MG PO TABS
ORAL_TABLET | ORAL | 2 refills | Status: DC
Start: 2019-12-28 — End: 2020-03-24

## 2019-12-28 NOTE — Progress Notes (Signed)
   GYN VISIT Patient name: Julia Blanchard MRN [id number]  Date of birth: 07/02/1990 Chief Complaint:   Follow-up (meds/PPD)  History of Present Illness:   Julia Blanchard is a 29 y.o. [phone] Caucasian female being seen today for f/u on increase in zoloft to 50mg  on 11/30/19 for PPD/anxiety. Doesn't feel like it has helped, feels anxiety has actually worsened. Denies SI/HI/II. Has 1st appt w/ [facility] therapist this Thurs.  EPDS 16 (on 7/13), PHQ9 17 (on 8/23), EPDS 18 today. Discussed option of increasing zoloft as she feels it did help some in beginning vs. switching meds, wants to try a different medicine.  Depression screen [facility] 2/9 11/30/2019 07/09/2019 03/17/2019 01/26/2019  Decreased Interest 2 0 0 0  Down, Depressed, Hopeless 2 0 0 0  PHQ - 2 Score 4 0 0 0  Altered sleeping 3 - - -  Tired, decreased energy 3 - - -  Change in appetite 2 - - -  Feeling bad or failure about yourself  2 - - -  Trouble concentrating 2 - - -  Moving slowly or fidgety/restless 1 - - -  Suicidal thoughts 0 - - -  PHQ-9 Score 17 - - -  Difficult doing work/chores Very difficult - - -    No [name] recorded. (Menstrual status: Lactating). The current method of family planning is Nexplanon.  Last pap 03/17/19. Results were:  normal Review of Systems:   Pertinent items are noted in HPI Denies fever/chills, dizziness, headaches, visual disturbances, fatigue, shortness of breath, chest pain, abdominal pain, vomiting, abnormal vaginal discharge/itching/odor/irritation, problems with periods, bowel movements, urination, or intercourse unless otherwise stated above.  Pertinent History Reviewed:  Reviewed past medical,surgical, social, obstetrical and family history.  Reviewed problem list, medications and allergies. Physical Assessment:   Vitals:   12/28/19 1410  BP: 116/80  Pulse: 81  Weight: 220 lb (99.8 kg)  Height: 5\' 1"  (1.549 m)  Body mass index is 41.57 kg/m.       Physical Examination:   General  appearance: alert, well appearing, and in no distress  Mental status: alert, oriented to person, place, and time  Skin: warm & dry   Cardiovascular: normal heart rate noted  Respiratory: normal respiratory effort, no distress  Abdomen: soft, non-tender   Pelvic: examination not indicated  Extremities: no edema   Chaperone: n/a    No results found for this or any previous visit (from the past 24 hour(s)).  Assessment & Plan:  1) PPD/anxiety> currently on zoloft 50mg  daily-not helping, anxiety worsening, wants to switch meds. To take zoloft 25mg  daily x few days, then switch to celexa. Keep appt w/ [facility] for therapy on Thurs, f/u here in 4wks  Meds:  Meds ordered this encounter  Medications  . citalopram (CELEXA) 20 MG tablet    Sig: Take 10mg  (1/2 tablet) daily x 1 week, then 20mg  (1 tablet) daily thereafter    Dispense:  30 tablet    Refill:  2    Order Specific Question:   Supervising Provider    Answer:   Elonda Husky, LUTHER H [[id number]]    No orders of the defined types were placed in this encounter.   Return in about 4 weeks (around 01/25/2020) for F/U, with me.  [place], [facility] 12/28/2019 2:43 PM

## 2019-12-29 ENCOUNTER — Ambulatory Visit (INDEPENDENT_AMBULATORY_CARE_PROVIDER_SITE_OTHER): Payer: Medicaid Other | Admitting: Internal Medicine

## 2019-12-29 ENCOUNTER — Other Ambulatory Visit: Payer: Self-pay

## 2019-12-29 ENCOUNTER — Encounter (INDEPENDENT_AMBULATORY_CARE_PROVIDER_SITE_OTHER): Payer: Self-pay | Admitting: Internal Medicine

## 2019-12-29 VITALS — BP 123/80 | HR 73 | Temp 97.0°F | Resp 18 | Ht 65.0 in | Wt 217.4 lb

## 2019-12-29 DIAGNOSIS — O99345 Other mental disorders complicating the puerperium: Secondary | ICD-10-CM | POA: Diagnosis not present

## 2019-12-29 DIAGNOSIS — F53 Postpartum depression: Secondary | ICD-10-CM | POA: Diagnosis not present

## 2019-12-29 DIAGNOSIS — E669 Obesity, unspecified: Secondary | ICD-10-CM | POA: Diagnosis not present

## 2019-12-29 NOTE — Progress Notes (Signed)
Metrics: Intervention Frequency ACO  Documented Smoking Status Yearly  Screened one or more times in 24 months  Cessation Counseling or  Active cessation medication Past 24 months  Past 24 months   Guideline developer: UpToDate (See UpToDate for funding source) Date Released: 2014       Wellness Office Visit  Subjective:  Patient ID: Julia Blanchard, female    DOB: [birth year]  Age: 29 y.o. MRN: [id number]  CC: This lady is postpartum and she comes in for follow-up.  She has a baby son who is about 49 months old. HPI  She is being followed by OB/GYN and she tells me that since the birth of her son, she has had a tendency towards depression and anxiety.  She has been tried on Zoloft which does not seem to help her now Celexa has been given to her.  She is not suicidal or homicidal at all. She is by herself at home most of the time and does not seem to have adult company. Past Medical History:  Diagnosis Date  . Asthma    Pt states "acts up when sick"  . Gestational diabetes    Metformin   Past Surgical History:  Procedure Laterality Date  . BREAST LUMPECTOMY Right 2014   right breast-benign  . removal rt palm lesion       Family History  Problem Relation Age of Onset  . Stroke Paternal Grandfather   . Cancer Paternal Grandmother        brain  . Cancer Maternal Grandfather        pancreatic    Social History   Social History Narrative   Married for 12 months.Lives with husband and step-daughter.Originally from [place].   Social History   Tobacco Use  . Smoking status: Former Research scientist (life sciences)  . Smokeless tobacco: Never Used  Substance Use Topics  . Alcohol use: Never    Current Meds  Medication Sig  . citalopram (CELEXA) 20 MG tablet Take 10mg  (1/2 tablet) daily x 1 week, then 20mg  (1 tablet) daily thereafter  . Prenatal Vit-Fe Fumarate-FA (PRENATAL MULTIVITAMIN) TABS tablet Take 1 tablet by mouth daily at 12 noon.      Depression screen [facility] 2/9 11/30/2019 07/09/2019  03/17/2019 01/26/2019  Decreased Interest 2 0 0 0  Down, Depressed, Hopeless 2 0 0 0  PHQ - 2 Score 4 0 0 0  Altered sleeping 3 - - -  Tired, decreased energy 3 - - -  Change in appetite 2 - - -  Feeling bad or failure about yourself  2 - - -  Trouble concentrating 2 - - -  Moving slowly or fidgety/restless 1 - - -  Suicidal thoughts 0 - - -  PHQ-9 Score 17 - - -  Difficult doing work/chores Very difficult - - -     Objective:   Today's Vitals: BP 123/80 (BP Location: Right Arm, Patient Position: Sitting, Cuff Size: Normal)   Pulse 73   Temp (!) 97 F (36.1 C) (Temporal)   Resp 18   Ht 5\' 5"  (1.651 m)   Wt 217 lb 6.4 oz (98.6 kg)   SpO2 98%   BMI 36.18 kg/m  Vitals with BMI 12/29/2019 12/28/2019 11/30/2019  Height 5\' 5"  5\' 1"  5\' 1"   Weight 217 lbs 6 oz 220 lbs 216 lbs  BMI 36.18 81.85 63.14  Systolic 970 263 785  Diastolic 80 80 71  Pulse 73 81 69     Physical Exam  She looks systemically well.  She is obese.       Assessment   1. Post partum depression   2. Obesity (BMI 30-39.9)       Tests ordered No orders of the defined types were placed in this encounter.    Plan: 1. I told her that if the Celexa does not help her, she should let me know.  Her progesterone levels are likely be extremely low postpartum and she will benefit from progesterone at night for her postpartum depression/anxiety. 2. We also discussed COVID-19 vaccination and I have encouraged her to get vaccinated. 3. Follow-up in 3 months.   No orders of the defined types were placed in this encounter.   Doree Albee, MD

## 2019-12-31 ENCOUNTER — Ambulatory Visit (INDEPENDENT_AMBULATORY_CARE_PROVIDER_SITE_OTHER): Payer: Medicaid Other | Admitting: Clinical

## 2019-12-31 DIAGNOSIS — F4323 Adjustment disorder with mixed anxiety and depressed mood: Secondary | ICD-10-CM

## 2019-12-31 NOTE — Patient Instructions (Signed)
[facility] [address] [place], [place] [address] [phone] (main office) [phone] (Mialee Weyman's office)  Coping with Panic Attacks   What is a panic attack?  You may have had a panic attack if you experienced four or more of the symptoms listed below coming on abruptly and peaking in about 10 minutes.  Panic Symptoms   . Pounding heart  . Sweating  . Trembling or shaking  . Shortness of breath  . Feeling of choking  . Chest pain  . Nausea or abdominal distress    . Feeling dizzy, unsteady, lightheaded, or faint  . Feelings of unreality or being detached from yourself  . Fear of losing control or going crazy  . Fear of dying  . Numbness or tingling  . Chills or hot flashes      Panic attacks are sometimes accompanied by avoidance of certain places or situations. These are often situations that would be difficult to escape from or in which help might not be available. Examples might include crowded shopping malls, public transportation, restaurants, or driving.   Why do panic attacks occur?   Panic attacks are the body's alarm system gone awry. All of us have a built-in alarm system, powered by adrenaline, which increases our heart rate, breathing, and blood flow in response to danger. Ordinarily, this 'danger response system' works well. In some people, however, the response is either out of proportion to whatever stress is going on, or may come out of the blue without any stress at all.   For example, if you are walking in the woods and see a bear coming your way, a variety of changes occur in your body to prepare you to either fight the danger or flee from the situation. Your heart rate will increase to get more blood flow around your body, your breathing rate will quicken so that more oxygen is available, and your muscles will tighten in order to be ready to fight or run. You may feel nauseated as blood flow leaves your  stomach area and moves into your limbs. These bodily changes are all essential to helping you survive the dangerous situation. After the danger has passed, your body functions will begin to go back to normal. This is because your body also has a system for "recovering" by bringing your body back down to a normal state when the danger is over.   As you can see, the emergency response system is adaptive when there is, in fact, a "true" or "real" danger (e.g., bear). However, sometimes people find that their emergency response system is triggered in "everyday" situations where there really is no true physical danger (e.g., in a meeting, in the grocery store, while driving in normal traffic, etc.).   What triggers a panic attack?  Sometimes particularly stressful situations can trigger a panic attack. For example, an argument with your spouse or stressors at work can cause a stress response (activating the emergency response system) because you perceive it as threatening or overwhelming, even if there is no direct risk to your survival.  Sometimes panic attacks don't seem to be triggered by anything in particular- they may "come out of the blue". Somehow, the natural "fight or flight" emergency response system has gotten activated when there is no real danger. Why does the body go into "emergency mode" when there is no real danger?   Often, people with panic attacks are frightened or alarmed by the physical sensations of   the emergency response system. First, unexpected physical sensations are experienced (tightness in your chest or some shortness of breath). This then leads to feeling fearful or alarmed by these symptoms ("Something's wrong!", "Am I having a heart attack?", "Am I going to faint?") The mind perceives that there is a danger even though no real danger exists. This, in turn, activates the emergency response system ("fight or flight"), leading to a "full blown" panic attack. In summary, panic  attacks occur when we misinterpret physical symptoms as signs of impending death, craziness, loss of control, embarrassment, or fear of fear. Sometimes you may be aware of thoughts of danger that activate the emergency response system (for example, thinking "I'm having a heart attack" when you feel chest pressure or increased heart rate). At other times, however, you may not be aware of such thoughts. After several incidences of being afraid of physical sensations, anxiety and panic can occur in response to the initial sensations without conscious thoughts of danger. Instead, you just feel afraid or alarmed. In other words, the panic or fear may seem to occur "automatically" without you consciously telling yourself anything.   After having had one or more panic attacks, you may also become more focused on what is going on inside your body. You may scan your body and be more vigilant about noticing any symptoms that might signal the start of a panic attack. This makes it easier for panic attacks to happen again because you pick up on sensations you might otherwise not have noticed, and misinterpret them as something dangerous. A panic attack may then result.      How do I cope with panic attacks?  An important part of overcoming panic attacks involves re-interpreting your body's physical reactions and teaching yourself ways to decrease the physical arousal. This can be done through practicing the cognitive and behavioral interventions below.   Research has found that over half of people who have panic attacks show some signs of hyperventilation or overbreathing. This can produce initial sensations that alarm you and lead to a panic attack. Overbreathing can also develop as part of the panic attack and make the symptoms worse. When people hyperventilate, certain blood vessels in the body become narrower. In particular, the brain may get slightly less oxygen. This can lead to the symptoms of dizziness,  confusion, and lightheadedness that often occur during panic attacks. Other parts of the body may also get a bit less oxygen, which may lead to numbness or tingling in the hands or feet or the sensation of cold, clammy hands. It also may lead the heart to pump harder. Although these symptoms may be frightening and feel unpleasant, it is important to remember that hyperventilating is not dangerous. However, you can help overcome the unpleasantness of overbreathing by practicing Breathing Retraining.   Practice this basic technique three times a day, every day:  . Inhale. With your shoulders relaxed, inhale as slowly and deeply as you can while you count to six. If you can, use your diaphragm to fill your lungs with air.  . Hold. Keep the air in your lungs as you slowly count to four.  . Exhale. Slowly breath out as you count to six.  . Repeat. Do the inhale-hold-exhale cycle several times. Each time you do it, exhale for longer counts.  Like any new skill, Breathing Retraining requires practice. Try practicing this skill twice a day for several minutes. Initially, do not try this technique in specific situations or when you   become frightened or have a panic attack. Begin by practicing in a quiet environment to build up your skill level so that you can later use it in time of "emergency."   2. Decreasing Avoidance  Regardless of whether you can identify why you began having panic attacks or whether they seemed to come out of the blue, the places where you began having panic attacks often can become triggers themselves. It is not uncommon for individuals to begin to avoid the places where they have had panic attacks. Over time, the individual may begin to avoid more and more places, thereby decreasing their activities and often negatively impacting their quality of life. To break the cycle of avoidance, it is important to first identify the places or situations that are being avoided, and then to do some  "relearning."  To begin this intervention, first create a list of locations or situations that you tend to avoid. Then choose an avoided location or situation that you would like to target first. Now develop an "exposure hierarchy" for this situation or location. An "exposure hierarchy" is a list of actions that make you feel anxious in this situation. Order these actions from least to most anxiety-producing. It is often helpful to have the first item on your hierarchy involve thinking or imagining part of the feared/avoided situation.   Here is an example of an exposure hierarchy for decreasing avoidance of the grocery store. Note how it is ordered from the least amount of anxiety (at the top) to the most anxiety (at the bottom):  . Think about going to the grocery store alone.  . Go to the grocery store with a friend or family member.  . Go to the grocery store alone to pick up a few small items (5-10 minutes in the store).  . Shopping for 10-20 minutes in the store alone.  . Doing the shopping for the week by myself (20-30 minutes in the store).   Your homework is to "expose" yourself to the lowest item on your hierarchy and use your breathing relaxation and coping statements (see below) to help you remain in the situation. Practice this several times during the upcoming week. Once you have mastered each item with minimal anxiety, move on to the next higher action on your list.   Cognitive Interventions  1. Identify your negative self-talk Anxious thoughts can increase anxiety symptoms and panic. The first step in changing anxious thinking is to identify your own negative, alarming self-talk. Some common alarming thoughts:  . I'm having a heart attack.            . I must be going crazy. . I think I'm dying. . People will think I'm crazy. . I'm going to pass our.  . Oh no- here it comes.  . I can't stand this.  . I've got to get out of here!  2. Use positive coping statements Changing or  disrupting a pattern of anxious thoughts by replacing them with more calming or supportive statements can help to divert a panic attack. Some common helpful coping statements:  . This is not an emergency.  . I don't like feeling this way, but I can accept it.  . I can feel like this and still be okay.  . This has happened before, and I was okay. I'll be okay this time, too.  . I can be anxious and still deal with this situation.    /Emotional Wellbeing Apps and Websites Here are a few   free apps meant to help you to help yourself.  To find, try searching on the internet to see if the app is offered on Apple/Android devices. If your first choice doesn't come up on your device, the good news is that there are many choices! Play around with different apps to see which ones are helpful to you.    Calm This is an app meant to help increase calm feelings. Includes info, strategies, and tools for tracking your feelings.      Calm Harm  This app is meant to help with self-harm. Provides many 5-minute or 15-min coping strategies for doing instead of hurting yourself.       Healthy Minds Health Minds is a problem-solving tool to help deal with emotions and cope with stress you encounter wherever you are.      MindShift This app can help people cope with anxiety. Rather than trying to avoid anxiety, you can make an important shift and face it.      MY3  MY3 features a support system, safety plan and resources with the goal of offering a tool to use in a time of need.       My Life My Voice  This mood journal offers a simple solution for tracking your thoughts, feelings and moods. Animated emoticons can help identify your mood.       Relax Melodies Designed to help with sleep, on this app you can mix sounds and meditations for relaxation.      Smiling Mind Smiling Mind is meditation made easy: it's a simple tool that helps put a smile on your mind.        Stop, Breathe &  Think  A friendly, simple guide for people through meditations for mindfulness and compassion.  Stop, Breathe and Think Kids Enter your current feelings and choose a "mission" to help you cope. Offers videos for certain moods instead of just sound recordings.       Team Orange The goal of this tool is to help teens change how they think, act, and react. This app helps you focus on your own good feelings and experiences.      The Virtual Hope Box The Virtual Hope Box (VHB) contains simple tools to help patients with coping, relaxation, distraction, and positive thinking.     

## 2019-12-31 NOTE — BH Specialist Note (Signed)
Integrated Behavioral Health via Telemedicine Video [employer]) Visit  12/31/2019 Rikki Trosper [id number]  Number of [place] visits: 1 Session Start time: 3:20  Session End time: 4:07 Total time: 47 minutes  Referring Provider: Wells Guiles, CNM Type of Service: Individual Patient/Family location: Home [facility] Provider location: [facility] [place] [facility]  All persons participating in visit: Patient Julia Blanchard and [place]     I connected with [employer]  by a video enabled telemedicine application ([place]) and verified that I am speaking with the correct person using two identifiers.   Discussed confidentiality: Yes   Confirmed demographics & insurance:  Yes   I discussed that engaging in this virtual visit, they consent to the provision of behavioral healthcare and the services will be billed under their insurance.   Patient and/or legal guardian expressed understanding and consented to virtual visit: Yes   PRESENTING CONCERNS: Patient and/or family reports the following symptoms/concerns: Pt states her primary concern today is an increase in anxiety with panic attacks and depression postpartum Duration of problem: Postpartum; Severity of problem: moderately severe  STRENGTHS (Protective Factors/Coping Skills): Supportive family  ASSESSMENT: Patient currently experiencing Adjustment disorder with mixed depression and anxiety.    GOALS ADDRESSED: Patient will: 1.  Reduce symptoms of: anxiety and depression  2.  Increase knowledge and/or ability of: self-management skills  3.  Demonstrate ability to: Increase healthy adjustment to current life circumstances   Progress of Goals: Ongoing  INTERVENTIONS: Interventions utilized:  Optician, dispensing and Psychoeducation and/or Health Education Standardized Assessments completed & reviewed: GAD-7 and PHQ  9   OUTCOME: Patient Response: Pt agrees to treatment plan   PLAN: 1. Follow up with behavioral health clinician on : Two weeks 2. Behavioral recommendations:  -Begin taking Celexa as prescribed -CALM relaxation breathing exercise twice daily (morning; evening) -Consider apps for additional self-care -Read educational materials regarding coping with symptoms of anxiety with panic attacks  3. Referral(s): [facility] (In Clinic)  I discussed the assessment and treatment plan with the patient and/or parent/guardian. They were provided an opportunity to ask questions and all were answered. They agreed with the plan and demonstrated an understanding of the instructions.   They were advised to call back or seek an in-person evaluation as appropriate.  I discussed that the purpose of this visit is to provide behavioral health care while limiting exposure to the novel coronavirus.  Discussed there is a possibility of technology failure and discussed alternative modes of communication if that failure occurs.  Caroleen Hamman [facility]  Depression screen [facility] 2/9 12/31/2019 11/30/2019 07/09/2019 03/17/2019 01/26/2019  Decreased Interest 2 2 0 0 0  Down, Depressed, Hopeless 2 2 0 0 0  PHQ - 2 Score 4 4 0 0 0  Altered sleeping 3 3 - - -  Tired, decreased energy 3 3 - - -  Change in appetite 3 2 - - -  Feeling bad or failure about yourself  2 2 - - -  Trouble concentrating 2 2 - - -  Moving slowly or fidgety/restless 1 1 - - -  Suicidal thoughts 0 0 - - -  PHQ-9 Score 18 17 - - -  Difficult doing work/chores - Very difficult - - -  ] GAD 7 : Generalized Anxiety Score 12/31/2019  Nervous, Anxious, on Edge 3  Control/stop worrying 3  Worry too much - different things 3  Trouble relaxing 3  Restless 3  Easily annoyed or irritable 2  Afraid - awful might happen 2  Total GAD 7 Score 19

## 2020-01-15 NOTE — BH Specialist Note (Signed)
Integrated Behavioral Health via Telemedicine Video [employer]) Visit  01/15/2020 Saralynn Langhorst [id number]  Number of Integrated Behavioral Health visits: 2 Session Start time: 2:18  Session End time: 2:45 Total time: 27 minutes  Referring Provider: Wells Guiles, CNM Type of Service: Individual Patient/Family location: Home [facility] Provider location: Center for [place] [facility]  All persons participating in visit: Patient Julia Blanchard and [place]     I connected with [employer]  by a video enabled telemedicine application ([place]) and verified that I am speaking with the correct person using two identifiers.   Discussed confidentiality: at previous visi  Confirmed demographics & insurance:  Yes   I discussed that engaging in this virtual visit, they consent to the provision of behavioral healthcare and the services will be billed under their insurance.   Patient and/or legal guardian expressed understanding and consented to virtual visit: Yes   PRESENTING CONCERNS: Patient and/or family reports the following symptoms/concerns: Pt states she feels no difference after taking Celexa as prescribed for almost three weeks; pt using relaxation breathing exercise once daily that is temporarily helpful.  Duration of problem: Postpartum; Severity of problem: severe  STRENGTHS (Protective Factors/Coping Skills): Supportive family  ASSESSMENT: Patient currently experiencing Adjustment disorder with mixed depression and anxiety.    GOALS ADDRESSED: Patient will: 1.  Reduce symptoms of: anxiety and depression  2.  Demonstrate ability to: Increase healthy adjustment to current life circumstances and Increase adequate support systems for patient/family   Progress of Goals: Ongoing  INTERVENTIONS: Interventions utilized:  Supportive Counseling, Medication Monitoring and Link to [employer] Standardized  Assessments completed & reviewed: GAD-7 and PHQ 9   OUTCOME: Patient Response: Pt agrees to treatment plan   PLAN: 1. Follow up with behavioral health clinician on : [facility] [name] will call in one week.  2. Behavioral recommendations:  -Continue taking Celexa as prescribed; discuss any changes with medical providers any changes (PCP and CNM) -Continue using relaxation breathing exercise daily -Take at least 20 (up to 60) minute walks daily (morning time is ideal to help with evening sleep reset for mom and baby) -Consider registering for and attending new mom online support group at www.[email or web address].net   3. Referral(s): Integrated Orthoptist (In Clinic) and [employer] Resources:  New mom online support  I discussed the assessment and treatment plan with the patient and/or parent/guardian. They were provided an opportunity to ask questions and all were answered. They agreed with the plan and demonstrated an understanding of the instructions.   They were advised to call back or seek an in-person evaluation as appropriate.  I discussed that the purpose of this visit is to provide behavioral health care while limiting exposure to the novel coronavirus.  Discussed there is a possibility of technology failure and discussed alternative modes of communication if that failure occurs.  Caroleen Hamman [facility]  Depression screen [facility] 2/9 01/18/2020 12/31/2019 11/30/2019 07/09/2019 03/17/2019  Decreased Interest 2 2 2  0 0  Down, Depressed, Hopeless 3 2 2  0 0  PHQ - 2 Score 5 4 4  0 0  Altered sleeping 3 3 3  - -  Tired, decreased energy 3 3 3  - -  Change in appetite 3 3 2  - -  Feeling bad or failure about yourself  3 2 2  - -  Trouble concentrating 2 2 2  - -  Moving slowly or fidgety/restless 1 1 1  - -  Suicidal thoughts 0 0 0 - -  PHQ-9 Score  20 18 17  - -  Difficult doing work/chores - - Very difficult - -   GAD 7 : Generalized Anxiety Score 01/18/2020 12/31/2019  Nervous, Anxious, on Edge 3 3   Control/stop worrying 3 3  Worry too much - different things 3 3  Trouble relaxing 3 3  Restless 3 3  Easily annoyed or irritable 3 2  Afraid - awful might happen 1 2  Total GAD 7 Score 19 19

## 2020-01-18 ENCOUNTER — Ambulatory Visit (INDEPENDENT_AMBULATORY_CARE_PROVIDER_SITE_OTHER): Payer: Medicaid Other | Admitting: Clinical

## 2020-01-18 ENCOUNTER — Other Ambulatory Visit: Payer: Self-pay

## 2020-01-18 DIAGNOSIS — O99345 Other mental disorders complicating the puerperium: Secondary | ICD-10-CM

## 2020-01-18 DIAGNOSIS — F4323 Adjustment disorder with mixed anxiety and depressed mood: Secondary | ICD-10-CM | POA: Diagnosis not present

## 2020-01-18 NOTE — Patient Instructions (Signed)
Center for [facility] at [facility] [place], [place] [address] [phone] (main office) [phone] ([place] office)  [email or web address] (online new mom support groups)

## 2020-01-25 ENCOUNTER — Encounter (INDEPENDENT_AMBULATORY_CARE_PROVIDER_SITE_OTHER): Payer: Self-pay | Admitting: Internal Medicine

## 2020-01-25 ENCOUNTER — Other Ambulatory Visit: Payer: Self-pay

## 2020-01-25 ENCOUNTER — Ambulatory Visit (INDEPENDENT_AMBULATORY_CARE_PROVIDER_SITE_OTHER): Payer: Medicaid Other | Admitting: Women's Health

## 2020-01-25 ENCOUNTER — Encounter: Payer: Self-pay | Admitting: Women's Health

## 2020-01-25 ENCOUNTER — Other Ambulatory Visit (INDEPENDENT_AMBULATORY_CARE_PROVIDER_SITE_OTHER): Payer: Self-pay | Admitting: Internal Medicine

## 2020-01-25 VITALS — BP 120/82 | HR 85 | Ht 61.0 in | Wt 223.0 lb

## 2020-01-25 DIAGNOSIS — O99345 Other mental disorders complicating the puerperium: Secondary | ICD-10-CM

## 2020-01-25 DIAGNOSIS — F418 Other specified anxiety disorders: Secondary | ICD-10-CM | POA: Diagnosis not present

## 2020-01-25 DIAGNOSIS — F53 Postpartum depression: Secondary | ICD-10-CM | POA: Diagnosis not present

## 2020-01-25 MED ORDER — PROGESTERONE 200 MG PO CAPS
200.0000 mg | ORAL_CAPSULE | Freq: Every day | ORAL | 3 refills | Status: DC
Start: 2020-01-25 — End: 2020-03-24

## 2020-01-25 NOTE — Progress Notes (Signed)
   GYN VISIT Patient name: Julia Blanchard MRN [id number]  Date of birth: 1990/06/20 Chief Complaint:   Follow-up (on Celexa; don't really see a change)  History of Present Illness:   Julia Blanchard is a 29 y.o. G108P1031 Caucasian female s/p SVB on 6/12, being seen today for f/u on celexa 20mg  rx'd on 9/20 for postpartum depression/anxiety. Tried zoloft 25mg  then 50mg  between July & Aug w/o improvement. Has had 2 therapy sessions w/ [name] w/ [facility]. Does not feel any improvement of sx on celexa, actually a little worse on some days. EPDS today 23, was 18 on 9/20. PHQ9=20 and GAD 7=19 on 10/11 w/ [name]. Denies SI/HI/II. PCP wants her to try progesterone. Depression screen [facility] 2/9 01/18/2020 12/31/2019 11/30/2019 07/09/2019 03/17/2019  Decreased Interest 2 2 2  0 0  Down, Depressed, Hopeless 3 2 2  0 0  PHQ - 2 Score 5 4 4  0 0  Altered sleeping 3 3 3  - -  Tired, decreased energy 3 3 3  - -  Change in appetite 3 3 2  - -  Feeling bad or failure about yourself  3 2 2  - -  Trouble concentrating 2 2 2  - -  Moving slowly or fidgety/restless 1 1 1  - -  Suicidal thoughts 0 0 0 - -  PHQ-9 Score 20 18 17  - -  Difficult doing work/chores - - Very difficult - -    No [name] recorded. (Menstrual status: Lactating). The current method of family planning is Nexplanon.  Last pap 03/17/19. Results were:  normal Review of Systems:   Pertinent items are noted in HPI Denies fever/chills, dizziness, headaches, visual disturbances, fatigue, shortness of breath, chest pain, abdominal pain, vomiting, abnormal vaginal discharge/itching/odor/irritation, problems with periods, bowel movements, urination, or intercourse unless otherwise stated above.  Pertinent History Reviewed:  Reviewed past medical,surgical, social, obstetrical and family history.  Reviewed problem list, medications and allergies. Physical Assessment:   Vitals:   01/25/20 1343  BP: 120/82  Pulse: 85  Weight: 223 lb (101.2 kg)  Height: 5\' 1"  (1.549 m)   Body mass index is 42.14 kg/m.       Physical Examination:   General appearance: alert, well appearing, and in no distress  Mental status: alert, oriented to person, place, and time  Skin: warm & dry   Cardiovascular: normal heart rate noted  Respiratory: normal respiratory effort, no distress  Abdomen: soft, non-tender   Pelvic: examination not indicated  Extremities: no edema   Chaperone: n/a    No results found for this or any previous visit (from the past 24 hour(s)).  Assessment & Plan:  1) PPD/anxiety> celexa not helping, EPDS score actually higher, so do not feel increasing dose would help.  No SI/HI/II. To stop celexa. PCP wants to try her on progesterone to see if this would help, thinks she may contact him to try. Does have a Nexplanon which is a type of progestin (etonogestrel). Continue therapy visits w/ [name]. Referral placed for [facility] for med management and therapy.  Meds: No orders of the defined types were placed in this encounter.   No orders of the defined types were placed in this encounter.   F/U 4wks w/ me  Roma Schanz CNM, WHNP-BC 01/25/2020 1:48 PM

## 2020-01-26 ENCOUNTER — Telehealth: Payer: Self-pay | Admitting: Clinical

## 2020-01-26 NOTE — Telephone Encounter (Signed)
Attempt to contact pt to schedule f/u visit; Left HIPPA-compliant message to call back [name] from [employer] for [employer] at [facility] at [phone] [facility] office).

## 2020-02-23 ENCOUNTER — Ambulatory Visit: Payer: Medicaid Other | Admitting: Women's Health

## 2020-03-22 ENCOUNTER — Encounter (INDEPENDENT_AMBULATORY_CARE_PROVIDER_SITE_OTHER): Payer: Self-pay | Admitting: Internal Medicine

## 2020-03-24 ENCOUNTER — Other Ambulatory Visit: Payer: Self-pay

## 2020-03-24 ENCOUNTER — Encounter (INDEPENDENT_AMBULATORY_CARE_PROVIDER_SITE_OTHER): Payer: Self-pay | Admitting: Internal Medicine

## 2020-03-24 ENCOUNTER — Ambulatory Visit (INDEPENDENT_AMBULATORY_CARE_PROVIDER_SITE_OTHER): Payer: Medicaid Other | Admitting: Internal Medicine

## 2020-03-24 VITALS — BP 118/78 | HR 73 | Temp 97.9°F | Ht 61.0 in | Wt 220.2 lb

## 2020-03-24 DIAGNOSIS — N61 Mastitis without abscess: Secondary | ICD-10-CM

## 2020-03-24 MED ORDER — AMOXICILLIN-POT CLAVULANATE 875-125 MG PO TABS: 1.0000 | ORAL_TABLET | Freq: Two times a day (BID) | ORAL | 0 refills | Status: DC

## 2020-03-24 MED ORDER — ALBUTEROL SULFATE HFA 108 (90 BASE) MCG/ACT IN AERS
2.0000 | INHALATION_SPRAY | Freq: Four times a day (QID) | RESPIRATORY_TRACT | 6 refills | Status: DC | PRN
Start: 2020-03-24 — End: 2022-01-05

## 2020-03-24 NOTE — Progress Notes (Signed)
Metrics: Intervention Frequency ACO  Documented Smoking Status Yearly  Screened one or more times in 24 months  Cessation Counseling or  Active cessation medication Past 24 months  Past 24 months   Guideline developer: UpToDate (See UpToDate for funding source) Date Released: 2014       Wellness Office Visit  Subjective:  Patient ID: Julia Blanchard, female    DOB: [birth year]  Age: 29 y.o. MRN: [id number]  CC: Right breast tenderness/swelling HPI  This lady comes in for an acute visit with the above symptoms which started about 3 to 4 days ago. She has had mastitis affecting the breast before. Augmentin has helped this before. She has been feeling feverish with this discomfort. Past Medical History:  Diagnosis Date  . Asthma    Pt states "acts up when sick"  . Gestational diabetes    Metformin   Past Surgical History:  Procedure Laterality Date  . BREAST LUMPECTOMY Right 2014   right breast-benign  . removal rt palm lesion       Family History  Problem Relation Age of Onset  . Stroke Paternal Grandfather   . Cancer Paternal Grandmother        brain  . Cancer Maternal Grandfather        pancreatic    Social History   Social History Narrative   Married for 12 months.Lives with husband and step-daughter.Originally from [place].   Social History   Tobacco Use  . Smoking status: Former Research scientist (life sciences)  . Smokeless tobacco: Never Used  Substance Use Topics  . Alcohol use: Never    Current Meds  Medication Sig  . Prenatal Vit-Fe Fumarate-FA (PRENATAL MULTIVITAMIN) TABS tablet Take 1 tablet by mouth daily at 12 noon.      Depression screen [facility] 2/9 03/24/2020 01/18/2020 12/31/2019 11/30/2019 07/09/2019  Decreased Interest 1 2 2 2  0  Down, Depressed, Hopeless 0 3 2 2  0  PHQ - 2 Score 1 5 4 4  0  Altered sleeping 3 3 3 3  -  Tired, decreased energy 3 3 3 3  -  Change in appetite 3 3 3 2  -  Feeling bad or failure about yourself  0 3 2 2  -  Trouble concentrating 0 2 2 2  -   Moving slowly or fidgety/restless 0 1 1 1  -  Suicidal thoughts 0 0 0 0 -  PHQ-9 Score 10 20 18 17  -  Difficult doing work/chores Somewhat difficult - - Very difficult -     Objective:   Today's Vitals: BP 118/78   Pulse 73   Temp 97.9 F (36.6 C) (Temporal)   Ht 5\' 1"  (1.549 m)   Wt 220 lb 3.2 oz (99.9 kg)   SpO2 97%   BMI 41.61 kg/m  Vitals with BMI 03/24/2020 01/25/2020 12/29/2019  Height 5\' 1"  5\' 1"  5\' 5"   Weight 220 lbs 3 oz 223 lbs 217 lbs 6 oz  BMI 41.63 93.26 71.24  Systolic 580 998 338  Diastolic 78 82 80  Pulse 73 85 73     Physical Exam  Patient was examined with chaperone in the room. She does have some warmth of the right breast and there is tenderness to touch.  There is no significant erythema that I can see today.     Assessment   1. Mastitis, acute       Tests ordered No orders of the defined types were placed in this encounter.    Plan: I will treat her with Augmentin and hopefully she  will improve.  If she does not, she will let me know. She will follow up with Judson Roch in about 6 months time.  Meds ordered this encounter  Medications  . amoxicillin-clavulanate (AUGMENTIN) 875-125 MG tablet    Sig: Take 1 tablet by mouth 2 (two) times daily.    Dispense:  20 tablet    Refill:  0  . albuterol (VENTOLIN HFA) 108 (90 Base) MCG/ACT inhaler    Sig: Inhale 2 puffs into the lungs every 6 (six) hours as needed for wheezing or shortness of breath.    Dispense:  18 g    Refill:  6    Anthony Tamburo Luther Parody, MD

## 2020-06-12 ENCOUNTER — Encounter (INDEPENDENT_AMBULATORY_CARE_PROVIDER_SITE_OTHER): Payer: Self-pay | Admitting: Internal Medicine

## 2020-09-22 ENCOUNTER — Telehealth (INDEPENDENT_AMBULATORY_CARE_PROVIDER_SITE_OTHER): Payer: Self-pay | Admitting: Nurse Practitioner

## 2020-09-22 ENCOUNTER — Encounter (INDEPENDENT_AMBULATORY_CARE_PROVIDER_SITE_OTHER): Payer: Self-pay | Admitting: Nurse Practitioner

## 2020-09-22 ENCOUNTER — Other Ambulatory Visit: Payer: Self-pay

## 2020-09-22 ENCOUNTER — Ambulatory Visit (INDEPENDENT_AMBULATORY_CARE_PROVIDER_SITE_OTHER): Payer: Medicaid Other | Admitting: Nurse Practitioner

## 2020-09-22 VITALS — BP 122/72 | HR 91 | Temp 97.9°F | Ht 61.0 in | Wt 231.8 lb

## 2020-09-22 DIAGNOSIS — Z1329 Encounter for screening for other suspected endocrine disorder: Secondary | ICD-10-CM | POA: Diagnosis not present

## 2020-09-22 DIAGNOSIS — Z8632 Personal history of gestational diabetes: Secondary | ICD-10-CM | POA: Diagnosis not present

## 2020-09-22 DIAGNOSIS — R21 Rash and other nonspecific skin eruption: Secondary | ICD-10-CM | POA: Diagnosis not present

## 2020-09-22 DIAGNOSIS — Z6841 Body Mass Index (BMI) 40.0 and over, adult: Secondary | ICD-10-CM | POA: Diagnosis not present

## 2020-09-22 DIAGNOSIS — L84 Corns and callosities: Secondary | ICD-10-CM | POA: Diagnosis not present

## 2020-09-22 DIAGNOSIS — Z13220 Encounter for screening for lipoid disorders: Secondary | ICD-10-CM

## 2020-09-22 DIAGNOSIS — B353 Tinea pedis: Secondary | ICD-10-CM | POA: Diagnosis not present

## 2020-09-22 MED ORDER — CLOTRIMAZOLE-BETAMETHASONE 1-0.05 % EX CREA: 1.0000 "application " | TOPICAL_CREAM | Freq: Two times a day (BID) | CUTANEOUS | 0 refills | Status: DC

## 2020-09-22 NOTE — Progress Notes (Signed)
   Subjective:  Patient ID: Julia Blanchard, female    DOB: 09/01/1990  Age: 29 y.o. MRN: [id number]  CC:  Chief Complaint  Patient presents with   Follow-up    Wants marks on her back looked at and her right foot, look at both feet as they are dry and cracking and going on for about 6 months and they are itching   Rash      HPI  This patient arrives today for the above.  She comes today with complaints of rash.  It is located on her left upper arm posteriorly.  She tells me has been present for the last 4 years or so but seems to be spreading.  The area does not cause much pain sometimes it will itch.  When she scratches it will sometimes bleed quite a bit.  She likes to be evaluated and treated.  She also has a hard callused area to the lateral aspect of her right foot.  She like this to be evaluated as well.  Otherwise she is feeling well.  She is due for blood work today.  She also reports some dry skin and itching to the right foot.  This is been going on for a few months.  She has not really tried anything over-the-counter to treat it.  Past Medical History:  Diagnosis Date   Asthma    Pt states "acts up when sick"   Gestational diabetes    Metformin      Family History  Problem Relation Age of Onset   Stroke Paternal Grandfather    Cancer Paternal Grandmother        brain   Cancer Maternal Grandfather        pancreatic    Social History   Social History Narrative   Married for 12 months.Lives with husband and step-daughter.Originally from [place].   Social History   Tobacco Use   Smoking status: Former    Pack years: 0.00   Smokeless tobacco: Never  Substance Use Topics   Alcohol use: Never     Current Meds  Medication Sig   albuterol (VENTOLIN HFA) 108 (90 Base) MCG/ACT inhaler Inhale 2 puffs into the lungs every 6 (six) hours as needed for wheezing or shortness of breath.   clotrimazole-betamethasone (LOTRISONE) cream Apply 1 application  topically 2 (two) times daily.    ROS:  Review of Systems  Constitutional:  Negative for diaphoresis and fever.  Respiratory:  Negative for shortness of breath.   Cardiovascular:  Negative for chest pain.  Gastrointestinal:  Negative for abdominal pain and blood in stool.  Neurological:  Negative for dizziness and headaches.    Objective:   Today's Vitals: BP 122/72   Pulse 91   Temp 97.9 F (36.6 C) (Temporal)   Ht 5' 1" (1.549 m)   Wt 231 lb 12.8 oz (105.1 kg)   SpO2 97%   BMI 43.80 kg/m  Vitals with BMI 09/22/2020 03/24/2020 01/25/2020  Height 5' 1" 5' 1" 5' 1"  Weight 231 lbs 13 oz 220 lbs 3 oz 223 lbs  BMI 43.82 41.63 42.16  Systolic 122 118 120  Diastolic 72 78 82  Pulse 91 73 85     Physical Exam Vitals reviewed.  Constitutional:      General: She is not in acute distress.    Appearance: Normal appearance.  HENT:     Head: Normocephalic and atraumatic.  Neck:     Vascular: No carotid bruit.  Cardiovascular:       Rate and Rhythm: Normal rate and regular rhythm.     Pulses: Normal pulses.     Heart sounds: Normal heart sounds.  Pulmonary:     Effort: Pulmonary effort is normal.     Breath sounds: Normal breath sounds.  Feet:     Right foot:     Toenail Condition: Fungal disease present. Skin:    General: Skin is warm and dry.       Neurological:     General: No focal deficit present.     Mental Status: She is alert and oriented to person, place, and time.  Psychiatric:        Mood and Affect: Mood normal.        Behavior: Behavior normal.        Judgment: Judgment normal.         Assessment and Plan   1. Rash   2. Tinea pedis of right foot   3. Corn of foot   4. Class 3 severe obesity without serious comorbidity with body mass index (BMI) of 40.0 to 44.9 in adult, unspecified obesity type (HCC)   5. History of gestational diabetes   6. Screening, lipid   7. Screening for thyroid disorder      Plan: 1.  Not sure of etiology.  It  definitely appears to be vascularized.  I had Dr. Gosrani also view the rash today in the office and he is not quite sure of etiology either.  For now we will refer to dermatology for assistance with diagnosis and management.  We will also check CBC with platelets today for further evaluation. 2.  I think the dry skin and rash in her toes represents tinea of the right foot.  We will treat with Lotrisone cream. 3.  The lesion on her right foot does appear to be a callus we will refer to podiatry as the patient reports it is painful and would like it removed if possible. Full.-7.  We will collect blood work for further evaluation today.   Tests ordered Orders Placed This Encounter  Procedures   CMP with eGFR([facility])   CBC with Differential/Platelets   Lipid Panel   Hemoglobin A1c   TSH   Ambulatory referral to Dermatology   Ambulatory referral to Podiatry      Meds ordered this encounter  Medications   clotrimazole-betamethasone (LOTRISONE) cream    Sig: Apply 1 application topically 2 (two) times daily.    Dispense:  30 g    Refill:  0    Order Specific Question:   Supervising Provider    Answer:   GOSRANI, NIMISH C [[id number]]    Patient to follow-up in 3 to 6 months or sooner as needed.   E , NP  

## 2020-09-22 NOTE — Telephone Encounter (Signed)
Referring to podiatry and dermatology, please make sure we send her to someone in [facility] [employer] network.

## 2020-09-23 LAB — COMPLETE METABOLIC PANEL WITH GFR
AG Ratio: 1.6 (calc) (ref 1.0–2.5)
ALT: 11 U/L (ref 6–29)
AST: 15 U/L (ref 10–30)
Albumin: 4.3 g/dL (ref 3.6–5.1)
Alkaline phosphatase (APISO): 77 U/L (ref 31–125)
BUN: 16 mg/dL (ref 7–25)
CO2: 25 mmol/L (ref 20–32)
Calcium: 9.3 mg/dL (ref 8.6–10.2)
Chloride: 102 mmol/L (ref 98–110)
Creat: 0.79 mg/dL (ref 0.50–1.10)
GFR, Est African American: 118 mL/min/{1.73_m2} (ref >=60)
GFR, Est Non African American: 102 mL/min/{1.73_m2} (ref >=60)
Globulin: 2.7 g/dL (calc) (ref 1.9–3.7)
Glucose, Bld: 103 mg/dL (ref 65–139)
Potassium: 4.1 mmol/L (ref 3.5–5.3)
Sodium: 136 mmol/L (ref 135–146)
Total Bilirubin: 0.4 mg/dL (ref 0.2–1.2)
Total Protein: 7 g/dL (ref 6.1–8.1)

## 2020-09-23 LAB — CBC WITH DIFFERENTIAL/PLATELET
Absolute Monocytes: 538 cells/uL (ref 200–950)
Basophils Absolute: 84 cells/uL (ref 0–200)
Basophils Relative: 1 %
Eosinophils Absolute: 286 cells/uL (ref 15–500)
Eosinophils Relative: 3.4 %
HCT: 41.1 % (ref 35.0–45.0)
Hemoglobin: 13.6 g/dL (ref 11.7–15.5)
Lymphs Abs: 2520 cells/uL (ref 850–3900)
MCH: 29.1 pg (ref 27.0–33.0)
MCHC: 33.1 g/dL (ref 32.0–36.0)
MCV: 87.8 fL (ref 80.0–100.0)
MPV: 10.9 fL (ref 7.5–12.5)
Monocytes Relative: 6.4 %
Neutro Abs: 4973 cells/uL (ref 1500–7800)
Neutrophils Relative %: 59.2 %
Platelets: 269 10*3/uL (ref 140–400)
RBC: 4.68 10*6/uL (ref 3.80–5.10)
RDW: 12.6 % (ref 11.0–15.0)
Total Lymphocyte: 30 %
WBC: 8.4 10*3/uL (ref 3.8–10.8)

## 2020-09-23 LAB — HEMOGLOBIN A1C
Hgb A1c MFr Bld: 5.4 % of total Hgb (ref <5.7)
Mean Plasma Glucose: 108 mg/dL
eAG (mmol/L): 6 mmol/L

## 2020-09-23 LAB — LIPID PANEL
Cholesterol: 180 mg/dL (ref <200)
HDL: 46 mg/dL — ABNORMAL LOW (ref >=50)
LDL Cholesterol (Calc): 105 mg/dL (calc) — ABNORMAL HIGH
Non-HDL Cholesterol (Calc): 134 mg/dL (calc) — ABNORMAL HIGH (ref <130)
Total CHOL/HDL Ratio: 3.9 (calc) (ref <5.0)
Triglycerides: 173 mg/dL — ABNORMAL HIGH (ref <150)

## 2020-09-23 LAB — TSH: TSH: 2.54 mIU/L

## 2020-09-26 NOTE — Telephone Encounter (Signed)
Sent off in systems

## 2020-10-05 ENCOUNTER — Ambulatory Visit (INDEPENDENT_AMBULATORY_CARE_PROVIDER_SITE_OTHER): Payer: Medicaid Other | Admitting: Podiatry

## 2020-10-05 ENCOUNTER — Other Ambulatory Visit: Payer: Self-pay

## 2020-10-05 DIAGNOSIS — B351 Tinea unguium: Secondary | ICD-10-CM

## 2020-10-05 DIAGNOSIS — L309 Dermatitis, unspecified: Secondary | ICD-10-CM

## 2020-10-05 MED ORDER — TERBINAFINE HCL 250 MG PO TABS: 250.0000 mg | ORAL_TABLET | Freq: Every day | ORAL | 0 refills | Status: DC

## 2020-10-06 NOTE — Progress Notes (Signed)
Subjective:   Patient ID: Julia Blanchard, female   DOB: 30 y.o.   MRN: [id number]   HPI Patient presents with corn or callus on the lateral side of the right foot and history of dry Cracked skin on the right foot.  States it is bothersome at times and is also concerned about some nail discoloration with itching on the right lateral foot and does not smoke likes to be active   Review of Systems  All other systems reviewed and are negative.      Objective:  Physical Exam Vitals and nursing note reviewed.  Constitutional:      Appearance: She is well-developed.  Pulmonary:     Effort: Pulmonary effort is normal.  Musculoskeletal:        General: Normal range of motion.  Skin:    General: Skin is warm.  Neurological:     Mental Status: She is alert.    Neurovascular status intact muscle strength adequate range of motion adequate with discoloration of the right lateral foot and into the digits with some white discoloration between the adjacent digits.  This is all right foot and has good digital perfusion well oriented x3     Assessment:  Probability for fungal infection right localized with previous cream that only helped to a degree     Plan:  H&P reviewed condition at great length and I do think an oral medicine would be of best benefit to her and I explained 45 Lamisil I did go over the risks with this and patient wants this and is placed on this.  Encouraged her to call questions concerns which may arise and if not getting better will need to see dermatologist

## 2020-10-19 ENCOUNTER — Other Ambulatory Visit (INDEPENDENT_AMBULATORY_CARE_PROVIDER_SITE_OTHER): Payer: Self-pay | Admitting: Internal Medicine

## 2021-02-23 ENCOUNTER — Other Ambulatory Visit: Payer: Self-pay

## 2021-02-23 ENCOUNTER — Encounter: Payer: Self-pay | Admitting: Internal Medicine

## 2021-02-23 ENCOUNTER — Ambulatory Visit (INDEPENDENT_AMBULATORY_CARE_PROVIDER_SITE_OTHER): Payer: Medicaid Other | Admitting: Internal Medicine

## 2021-02-23 VITALS — BP 129/85 | HR 80 | Temp 97.8°F | Ht 60.63 in | Wt 237.6 lb

## 2021-02-23 DIAGNOSIS — D1800 Hemangioma unspecified site: Secondary | ICD-10-CM | POA: Diagnosis not present

## 2021-02-23 DIAGNOSIS — R21 Rash and other nonspecific skin eruption: Secondary | ICD-10-CM

## 2021-02-23 DIAGNOSIS — Z9889 Other specified postprocedural states: Secondary | ICD-10-CM

## 2021-02-23 NOTE — Patient Instructions (Signed)
Please call to schedule your mammogram and/or bone density: °[facility]  °Address: [address], [place], [place] [address]  °Phone: [phone] ° °

## 2021-02-23 NOTE — Progress Notes (Signed)
BP 129/85   Pulse 80   Temp 97.8 F (36.6 C) (Oral)   Ht 5' 0.63" (1.54 m)   Wt 237 lb 9.6 oz (107.8 kg)   SpO2 98%   BMI 45.44 kg/m    Subjective:    Patient ID: Julia Blanchard, female    DOB: [birth year], 30 y.o.   MRN: [id number]  Chief Complaint  Patient presents with   New Patient (Initial Visit)    Has some red spots she would like for you to look at, she has been told in the past that they are hemangioma's. Would like to have a referral to Dermatology.     HPI: Julia Blanchard is a 30 y.o. female  Pt is here to establish care. She has a rash on the left shoudler and left arm.  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   Lumpectomy 2014 stable, in [place] had mammogram.  Past Surgical History: 2014: BREAST LUMPECTOMY; Right     Comment:  right breast-benign No date: removal rt palm lesion   Rash This is a chronic (has had rashes on the left arm , a couple on her chest and some on her back.) problem. The current episode started more than 1 year ago. The problem has been gradually worsening since onset. The affected locations include the left arm (left shoulder rash.). Pertinent negatives include no congestion, cough, diarrhea, eye pain, fatigue, fever, joint pain, nail changes, rhinorrhea, shortness of breath, sore throat or vomiting. Her past medical history is significant for asthma.  Asthma There is no chest tightness, cough, difficulty breathing, frequent throat clearing, hemoptysis, hoarse voice, shortness of breath, sputum production or wheezing. Primary symptoms comments: Worse when she has a URI acts up when she is sick , no wheezing or sob otherwise Last time used abluterol x sick with vaccine for COVID - . The current episode started more than 1 year ago. The problem has been unchanged. Pertinent negatives include no appetite change, chest pain, dyspnea on exertion, ear congestion, ear pain, fever, headaches,  heartburn, malaise/fatigue, myalgias, nasal congestion, orthopnea, PND, postnasal drip, rhinorrhea, sneezing, sore throat, sweats, trouble swallowing or weight loss. Her past medical history is significant for asthma.   Chief Complaint  Patient presents with   New Patient (Initial Visit)    Has some red spots she would like for you to look at, she has been told in the past that they are hemangioma's. Would like to have a referral to Dermatology.     Relevant past medical, surgical, family and social history reviewed and updated as indicated. Interim medical history since our last visit reviewed. Allergies and medications reviewed and updated.  Review of Systems  Constitutional:  Negative for appetite change, fatigue, fever, malaise/fatigue and weight loss.  HENT:  Negative for congestion, ear pain, hoarse voice, postnasal drip, rhinorrhea, sneezing, sore throat and trouble swallowing.   Eyes:  Negative for pain.  Respiratory:  Negative for cough, hemoptysis, sputum production, shortness of breath and wheezing.   Cardiovascular:  Negative for chest pain, dyspnea on exertion and PND.  Gastrointestinal:  Negative for diarrhea, heartburn and vomiting.  Musculoskeletal:  Negative for joint pain and myalgias.  Skin:  Positive for rash. Negative for nail changes.  Neurological:  Negative for headaches.   Per HPI unless specifically indicated above     Objective:    BP 129/85   Pulse 80   Temp 97.8  F (36.6 C) (Oral)   Ht 5' 0.63" (1.54 m)   Wt 237 lb 9.6 oz (107.8 kg)   SpO2 98%   BMI 45.44 kg/m   Wt Readings from Last 3 Encounters:  02/23/21 237 lb 9.6 oz (107.8 kg)  09/22/20 231 lb 12.8 oz (105.1 kg)  03/24/20 220 lb 3.2 oz (99.9 kg)    Physical Exam Vitals and nursing note reviewed.  Constitutional:      General: She is not in acute distress.    Appearance: Normal appearance. She is not ill-appearing or diaphoretic.  Eyes:     Conjunctiva/sclera: Conjunctivae normal.   Pulmonary:     Breath sounds: No rhonchi.  Abdominal:     General: Abdomen is flat. Bowel sounds are normal. There is no distension.     Palpations: Abdomen is soft. There is no mass.     Tenderness: There is no abdominal tenderness. There is no guarding.  Musculoskeletal:        General: No swelling or deformity.  Skin:    General: Skin is warm and dry.     Coloration: Skin is not jaundiced.     Findings: No erythema.  Neurological:     Mental Status: She is alert.    Results for orders placed or performed in visit on 09/22/20  CMP with eGFR([facility])  Result Value Ref Range   Glucose, Bld 103 65 - 139 mg/dL   BUN 16 7 - 25 mg/dL   Creat 0.79 0.50 - 1.10 mg/dL   GFR, Est Non African American 102 > OR = 60 mL/min/1.57m   GFR, Est African American 118 > OR = 60 mL/min/1.755m  BUN/Creatinine Ratio NOT APPLICABLE 6 - 22 (calc)   Sodium 136 135 - 146 mmol/L   Potassium 4.1 3.5 - 5.3 mmol/L   Chloride 102 98 - 110 mmol/L   CO2 25 20 - 32 mmol/L   Calcium 9.3 8.6 - 10.2 mg/dL   Total Protein 7.0 6.1 - 8.1 g/dL   Albumin 4.3 3.6 - 5.1 g/dL   Globulin 2.7 1.9 - 3.7 g/dL (calc)   AG Ratio 1.6 1.0 - 2.5 (calc)   Total Bilirubin 0.4 0.2 - 1.2 mg/dL   Alkaline phosphatase (APISO) 77 31 - 125 U/L   AST 15 10 - 30 U/L   ALT 11 6 - 29 U/L  CBC with Differential/Platelets  Result Value Ref Range   WBC 8.4 3.8 - 10.8 Thousand/uL   RBC 4.68 3.80 - 5.10 Million/uL   Hemoglobin 13.6 11.7 - 15.5 g/dL   HCT 41.1 35.0 - 45.0 %   MCV 87.8 80.0 - 100.0 fL   MCH 29.1 27.0 - 33.0 pg   MCHC 33.1 32.0 - 36.0 g/dL   RDW 12.6 11.0 - 15.0 %   Platelets 269 140 - 400 Thousand/uL   MPV 10.9 7.5 - 12.5 fL   Neutro Abs 4,973 1,500 - 7,800 cells/uL   Lymphs Abs 2,520 850 - 3,900 cells/uL   Absolute Monocytes 538 200 - 950 cells/uL   Eosinophils Absolute 286 15 - 500 cells/uL   Basophils Absolute 84 0 - 200 cells/uL   Neutrophils Relative % 59.2 %   Total Lymphocyte 30.0 %   Monocytes Relative  6.4 %   Eosinophils Relative 3.4 %   Basophils Relative 1.0 %  Lipid Panel  Result Value Ref Range   Cholesterol 180 <200 mg/dL   HDL 46 (L) > OR = 50 mg/dL   Triglycerides 173 (H) <  150 mg/dL   LDL Cholesterol (Calc) 105 (H) mg/dL (calc)   Total CHOL/HDL Ratio 3.9 <5.0 (calc)   Non-HDL Cholesterol (Calc) 134 (H) <130 mg/dL (calc)  Hemoglobin A1c  Result Value Ref Range   Hgb A1c MFr Bld 5.4 <5.7 % of total Hgb   Mean Plasma Glucose 108 mg/dL   eAG (mmol/L) 6.0 mmol/L  TSH  Result Value Ref Range   TSH 2.54 mIU/L        Current Outpatient Medications:    albuterol (VENTOLIN HFA) 108 (90 Base) MCG/ACT inhaler, Inhale 2 puffs into the lungs every 6 (six) hours as needed for wheezing or shortness of breath., Disp: 18 g, Rfl: 6    Assessment & Plan:  Asthma :  Uses albuterol prn.pt advised to take Tylenol q 4- 6 hourly as needed. pt to take allegra q pm as needed and to call office if symptoms worsened pt verbalised understanding of such.  2. Rash:  On shoulder and chest will need to fu with derm for such.   Problem List Items Addressed This Visit   None Visit Diagnoses     Multiple hemangiomas    -  Primary        No orders of the defined types were placed in this encounter.    No orders of the defined types were placed in this encounter.    Follow up plan: No follow-ups on file.  Health Maintenance : Mammogram ordered.  Labs next visit : CBC, CMP, FLP, HBA1C, TSH, PSA, urine microalbumin Labs 1 week prior to next visit.

## 2021-03-23 ENCOUNTER — Ambulatory Visit (INDEPENDENT_AMBULATORY_CARE_PROVIDER_SITE_OTHER): Payer: Medicaid Other | Admitting: Internal Medicine

## 2021-03-30 ENCOUNTER — Ambulatory Visit: Payer: Medicaid Other | Admitting: Nurse Practitioner

## 2021-03-31 ENCOUNTER — Ambulatory Visit: Payer: Medicaid Other | Admitting: Nurse Practitioner

## 2021-04-07 ENCOUNTER — Other Ambulatory Visit: Payer: Self-pay | Admitting: Internal Medicine

## 2021-04-07 DIAGNOSIS — N644 Mastodynia: Secondary | ICD-10-CM

## 2021-04-13 ENCOUNTER — Ambulatory Visit
Admission: RE | Admit: 2021-04-13 | Discharge: 2021-04-13 | Disposition: A | Discharge status: Home / Self Care | Payer: Medicaid Other | Source: Ambulatory Visit | Attending: Internal Medicine | Admitting: Internal Medicine

## 2021-04-13 ENCOUNTER — Other Ambulatory Visit: Payer: Self-pay

## 2021-04-13 ENCOUNTER — Other Ambulatory Visit: Payer: Self-pay | Admitting: Internal Medicine

## 2021-04-13 DIAGNOSIS — N644 Mastodynia: Secondary | ICD-10-CM | POA: Insufficient documentation

## 2021-04-13 DIAGNOSIS — R928 Other abnormal and inconclusive findings on diagnostic imaging of breast: Secondary | ICD-10-CM

## 2021-04-13 DIAGNOSIS — N6310 Unspecified lump in the right breast, unspecified quadrant: Secondary | ICD-10-CM

## 2021-04-13 DIAGNOSIS — R922 Inconclusive mammogram: Secondary | ICD-10-CM | POA: Diagnosis not present

## 2021-04-13 NOTE — Progress Notes (Signed)
Please let pt know this was abnormal and see if she is scheduled for a biopsy

## 2021-04-17 ENCOUNTER — Other Ambulatory Visit: Payer: Self-pay

## 2021-04-17 ENCOUNTER — Other Ambulatory Visit: Payer: Medicaid Other

## 2021-04-17 DIAGNOSIS — R21 Rash and other nonspecific skin eruption: Secondary | ICD-10-CM | POA: Diagnosis not present

## 2021-04-17 DIAGNOSIS — Z136 Encounter for screening for cardiovascular disorders: Secondary | ICD-10-CM | POA: Diagnosis not present

## 2021-04-17 LAB — URINALYSIS, ROUTINE W REFLEX MICROSCOPIC
Bilirubin, UA: NEGATIVE
Glucose, UA: NEGATIVE
Ketones, UA: NEGATIVE
Leukocytes,UA: NEGATIVE
Nitrite, UA: NEGATIVE
Protein,UA: NEGATIVE
Specific Gravity, UA: >1.03 — ABNORMAL HIGH (ref 1.005–1.030)
Urobilinogen, Ur: 0.2 mg/dL (ref 0.2–1.0)
pH, UA: 5.5 (ref 5.0–7.5)

## 2021-04-17 LAB — MICROSCOPIC EXAMINATION
Bacteria, UA: NONE SEEN
WBC, UA: NONE SEEN /hpf (ref 0–5)

## 2021-04-18 LAB — COMPREHENSIVE METABOLIC PANEL
ALT: 14 IU/L (ref 0–32)
AST: 17 IU/L (ref 0–40)
Albumin/Globulin Ratio: 1.4 (ref 1.2–2.2)
Albumin: 3.9 g/dL (ref 3.9–5.0)
Alkaline Phosphatase: 86 IU/L (ref 44–121)
BUN/Creatinine Ratio: 15 (ref 9–23)
BUN: 12 mg/dL (ref 6–20)
Bilirubin Total: 0.5 mg/dL (ref 0.0–1.2)
CO2: 24 mmol/L (ref 20–29)
Calcium: 9 mg/dL (ref 8.7–10.2)
Chloride: 104 mmol/L (ref 96–106)
Creatinine, Ser: 0.78 mg/dL (ref 0.57–1.00)
Globulin, Total: 2.7 g/dL (ref 1.5–4.5)
Glucose: 99 mg/dL (ref 70–99)
Potassium: 4 mmol/L (ref 3.5–5.2)
Sodium: 140 mmol/L (ref 134–144)
Total Protein: 6.6 g/dL (ref 6.0–8.5)
eGFR: 105 mL/min/{1.73_m2} (ref >59)

## 2021-04-18 LAB — CBC WITH DIFFERENTIAL/PLATELET
Basophils Absolute: 0.1 10*3/uL (ref 0.0–0.2)
Basos: 1 %
EOS (ABSOLUTE): 0.2 10*3/uL (ref 0.0–0.4)
Eos: 4 %
Hematocrit: 39.8 % (ref 34.0–46.6)
Hemoglobin: 13 g/dL (ref 11.1–15.9)
Immature Grans (Abs): 0 10*3/uL (ref 0.0–0.1)
Immature Granulocytes: 0 %
Lymphocytes Absolute: 2 10*3/uL (ref 0.7–3.1)
Lymphs: 30 %
MCH: 28.9 pg (ref 26.6–33.0)
MCHC: 32.7 g/dL (ref 31.5–35.7)
MCV: 88 fL (ref 79–97)
Monocytes Absolute: 0.4 10*3/uL (ref 0.1–0.9)
Monocytes: 6 %
Neutrophils Absolute: 4.1 10*3/uL (ref 1.4–7.0)
Neutrophils: 59 %
Platelets: 237 10*3/uL (ref 150–450)
RBC: 4.5 x10E6/uL (ref 3.77–5.28)
RDW: 12.3 % (ref 11.7–15.4)
WBC: 6.8 10*3/uL (ref 3.4–10.8)

## 2021-04-18 LAB — LIPID PANEL
Chol/HDL Ratio: 4.3 ratio (ref 0.0–4.4)
Cholesterol, Total: 173 mg/dL (ref 100–199)
HDL: 40 mg/dL (ref >39)
LDL Chol Calc (NIH): 111 mg/dL — ABNORMAL HIGH (ref 0–99)
Triglycerides: 124 mg/dL (ref 0–149)
VLDL Cholesterol Cal: 22 mg/dL (ref 5–40)

## 2021-04-18 LAB — TSH: TSH: 2.15 u[IU]/mL (ref 0.450–4.500)

## 2021-04-19 ENCOUNTER — Ambulatory Visit
Admission: RE | Admit: 2021-04-19 | Discharge: 2021-04-19 | Disposition: A | Discharge status: Home / Self Care | Payer: Medicaid Other | Source: Ambulatory Visit | Attending: Internal Medicine | Admitting: Internal Medicine

## 2021-04-19 ENCOUNTER — Other Ambulatory Visit: Payer: Self-pay

## 2021-04-19 DIAGNOSIS — R928 Other abnormal and inconclusive findings on diagnostic imaging of breast: Secondary | ICD-10-CM

## 2021-04-19 DIAGNOSIS — D241 Benign neoplasm of right breast: Secondary | ICD-10-CM | POA: Diagnosis not present

## 2021-04-19 DIAGNOSIS — N6310 Unspecified lump in the right breast, unspecified quadrant: Secondary | ICD-10-CM | POA: Insufficient documentation

## 2021-04-19 DIAGNOSIS — N6312 Unspecified lump in the right breast, upper inner quadrant: Secondary | ICD-10-CM | POA: Diagnosis not present

## 2021-04-19 HISTORY — PX: US BREAST BIOPSY (ARMC HX): HXRAD1425

## 2021-04-20 LAB — SURGICAL PATHOLOGY

## 2021-04-25 ENCOUNTER — Encounter: Payer: Self-pay | Admitting: Internal Medicine

## 2021-04-25 ENCOUNTER — Ambulatory Visit (INDEPENDENT_AMBULATORY_CARE_PROVIDER_SITE_OTHER): Payer: Medicaid Other | Admitting: Internal Medicine

## 2021-04-25 ENCOUNTER — Other Ambulatory Visit: Payer: Self-pay

## 2021-04-25 VITALS — BP 128/82 | HR 89 | Temp 97.9°F | Ht 60.63 in | Wt 240.0 lb

## 2021-04-25 DIAGNOSIS — D1800 Hemangioma unspecified site: Secondary | ICD-10-CM

## 2021-04-25 DIAGNOSIS — D249 Benign neoplasm of unspecified breast: Secondary | ICD-10-CM | POA: Diagnosis not present

## 2021-04-25 NOTE — Progress Notes (Signed)
BP 128/82    Pulse 89    Temp 97.9 F (36.6 C) (Oral)    Ht 5' 0.63" (1.54 m)    Wt 240 lb (108.9 kg)    [name] 04/17/2021    SpO2 97%    BMI 45.90 kg/m    Subjective:    Patient ID: Julia Blanchard, female    DOB: 10-03-1990, 31 y.o.   MRN: [id number]  Chief Complaint  Patient presents with   Mammorgram     Here for follow up    HPI: Julia Blanchard is a 31 y.o. 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  Rash This is a chronic problem.   Chief Complaint  Patient presents with   Mammorgram     Here for follow up    Relevant past medical, surgical, family and social history reviewed and updated as indicated. Interim medical history since our last visit reviewed. Allergies and medications reviewed and updated.  Review of Systems  Skin:  Positive for rash.   Per HPI unless specifically indicated above     Objective:    BP 128/82    Pulse 89    Temp 97.9 F (36.6 C) (Oral)    Ht 5' 0.63" (1.54 m)    Wt 240 lb (108.9 kg)    [name] 04/17/2021    SpO2 97%    BMI 45.90 kg/m   Wt Readings from Last 3 Encounters:  04/25/21 240 lb (108.9 kg)  02/23/21 237 lb 9.6 oz (107.8 kg)  09/22/20 231 lb 12.8 oz (105.1 kg)    Physical Exam Vitals and nursing note reviewed.  Constitutional:      General: She is not in acute distress.    Appearance: Normal appearance. She is not ill-appearing or diaphoretic.  Eyes:     Conjunctiva/sclera: Conjunctivae normal.  Cardiovascular:     Rate and Rhythm: Normal rate and regular rhythm.  Pulmonary:     Effort: No respiratory distress.     Breath sounds: No stridor. No wheezing, rhonchi or rales.  Abdominal:     General: Abdomen is flat. Bowel sounds are normal. There is no distension.     Palpations: Abdomen is soft. There is no mass.     Tenderness: There is no abdominal tenderness.  Skin:    General: Skin is warm and dry.     Coloration: Skin is not  jaundiced.     Findings: Lesion present. No erythema.  Neurological:     Mental Status: She is alert.  Psychiatric:        Mood and Affect: Mood normal.        Behavior: Behavior normal.    Results for orders placed or performed during the hospital encounter of 04/19/21  Surgical pathology  Result Value Ref Range   SURGICAL PATHOLOGY      SURGICAL PATHOLOGY CASE: [id number] PATIENT: [facility] Surgical Pathology Report     Specimen Submitted: A. Breast, right  Clinical History: 31 year old female with indeterminate oval retroareolar mass in right breast at 3:00. Ribbon clip placed.      DIAGNOSIS: A. BREAST, RIGHT RETROAREOLAR 3:00; ULTRASOUND-GUIDED BIOPSY: - SMALL FRAGMENT CONSISTENT WITH PARTIAL SAMPLING OF A FIBROEPITHELIAL LESION, SEE COMMENT. - PSEUDO ANGIOMATOUS STROMAL HYPERPLASIA, APOCRINE METAPLASIA, AND FOCAL ORGANIZING FAT NECROSIS. - DEEPER SECTIONS EXAMINED.  Comment: Sections demonstrate partial sampling of a fibroepithelial lesion with pericanalicular architecture and mildly  cellular myxoid stroma. Cytologic atypia and an increase in mitotic figures are not identified. These findings may represent a fibroadenoma and correlation with clinical impression is required as these findings may not be entirely representative if this is a limited sampling of a much larger le sion.  GROSS DESCRIPTION: A. Labeled: Right breast retroareolar 3:00 Received: Formalin Time/date in fixative: Collected and placed in formalin at 1:37 PM on 04/19/2021 Cold ischemic time: Less than 1 minute Total fixation time: Approximately 6.75 hours Core pieces: 5 Size: Range from 1.2-1.5 cm in length and 0.2 cm in diameter Description: Received are cores of white-tan soft tissue. Ink color: Black Entirely submitted in cassettes 1-2 with 2 cores in cassette 1 and 3 cores in cassette 2.  RB 04/19/2021  Final Diagnosis performed by Quay Burow, MD.   Electronically  signed 04/20/2021 12:54:26PM The electronic signature indicates that the named Attending Pathologist has evaluated the specimen Technical component performed at [facility] [address], [place], [place] [address] Lab: [phone] Dir: Rush Farmer, MD, MMM  Professional component performed at [facility], [facility], [place], [place], [place] [address] Lab: [phone] Dir:  Kathi Simpers, MD         Current Outpatient Medications:    albuterol (VENTOLIN HFA) 108 (90 Base) MCG/ACT inhaler, Inhale 2 puffs into the lungs every 6 (six) hours as needed for wheezing or shortness of breath., Disp: 18 g, Rfl: 6   Mammogram :  PATHOLOGY revealed: A. BREAST, RIGHT RETROAREOLAR 3:00; ULTRASOUND-GUIDED BIOPSY: - SMALL FRAGMENT CONSISTENT WITH PARTIAL SAMPLING OF A FIBROEPITHELIAL LESION, SEE COMMENT. - PSEUDO ANGIOMATOUS STROMAL HYPERPLASIA, APOCRINE METAPLASIA, AND FOCAL ORGANIZING FAT NECROSIS. - DEEPER SECTIONS EXAMINED. Comment: Sections demonstrate partial sampling of a fibroepithelial lesion with pericanalicular architecture and mildly cellular myxoid stroma. Cytologic atypia and an increase in mitotic figures are not identified. These findings may represent a fibroadenoma and correlation with clinical impression is required as these findings may not be entirely representative if this is a limited sampling of a much larger lesion.  Assessment & Plan:  Fibradenoma  Next mammmo x 6 months  Fu with radiology for rechekcing scans  2. Hemangiomas chronic, persistent will need to fu with Derm for such already has an appt       Problem List Items Addressed This Visit   None    No orders of the defined types were placed in this encounter.    No orders of the defined types were placed in this encounter.    Follow up plan: No follow-ups on file.

## 2021-08-11 ENCOUNTER — Telehealth: Payer: Self-pay

## 2021-08-11 ENCOUNTER — Encounter: Payer: Self-pay | Admitting: Internal Medicine

## 2021-08-11 DIAGNOSIS — Z87828 Personal history of other (healed) physical injury and trauma: Secondary | ICD-10-CM

## 2021-08-11 NOTE — Telephone Encounter (Signed)
This has never been discussed here, will need a clearance from an ortho please refer her thnx.

## 2021-08-11 NOTE — Telephone Encounter (Signed)
See mychart message. Patient was referred to ortho.  ?

## 2021-08-11 NOTE — Telephone Encounter (Signed)
Referral to ortho

## 2021-08-11 NOTE — Telephone Encounter (Signed)
Copied from [place] [phone]. Topic: General - Other ?>> Aug 11, 2021 11:07 AM Yvette Rack wrote: ?Reason for CRM: Pt stated she is at a new job and they are requesting a letter be sent to explaining pt has no restrictions. Pt stated she needs this letter to be faxed asap to [phone] because they will not allow her to begin work without receiving this letter from the pt pcp. Pt requests call back to advise. Cb# [phone] ?

## 2021-08-18 NOTE — Telephone Encounter (Signed)
Pt stated ortho advised her they were sending it back to PCP as they don't see a reason to see her.   ? ?Please advise. ?

## 2021-08-18 NOTE — Telephone Encounter (Signed)
Pl see what is going on, pt never discussed any of the issues that are being brought up in the past and not sure what she needs.

## 2021-08-21 ENCOUNTER — Encounter: Payer: Self-pay | Admitting: Physician Assistant

## 2021-08-21 ENCOUNTER — Ambulatory Visit (INDEPENDENT_AMBULATORY_CARE_PROVIDER_SITE_OTHER): Payer: Medicaid Other | Admitting: Physician Assistant

## 2021-08-21 VITALS — BP 115/79 | HR 76 | Temp 98.3°F | Ht 60.63 in | Wt 241.2 lb

## 2021-08-21 DIAGNOSIS — Z0000 Encounter for general adult medical examination without abnormal findings: Secondary | ICD-10-CM | POA: Diagnosis not present

## 2021-08-21 DIAGNOSIS — Z6841 Body Mass Index (BMI) 40.0 and over, adult: Secondary | ICD-10-CM

## 2021-08-21 DIAGNOSIS — E66813 Obesity, class 3: Secondary | ICD-10-CM

## 2021-08-21 NOTE — Patient Instructions (Addendum)
Sleep hygiene (Practices to help improve sleep) ? ?*Try to go to bed at the same time every night ?*Make your room as dark and quiet as possible for sleep (ambient noises or sleep machine is okay too) ?*Try to establish a bedtime routine before bed (shower, reading for a little bit, etc.) ?*No TVs or phone for at least 5mn to an hour before bed. (The blue light from these devices can affect your sleep-wake cycle and make it harder to go to sleep) ?*Avoid caffeine in the afternoon and especially before bedtime. ? ?The goal is to establish a relaxing environment and routine that signals your body that it is time to go to sleep.   ? ?I recommend seeing a dermatologist for the recurrent cyst on your left breast  ?

## 2021-08-21 NOTE — Progress Notes (Signed)
? ?   Annual Physical  ? ? ?Name: Julia Blanchard   MRN: [id number]    DOB: 04/06/1991   Date:08/21/2021 ? ?     ? ?Subjective ? ?Chief Complaint ? ?Chief Complaint  ?Patient presents with  ? Annual Exam  ?  Patient would like to have a letter stating that she has no restrictions for working.  Patient has a h/o back injury.   ? ? ?HPI ? ?Patient presents for annual CPE. ? ?Diet: Overall normal. Trying to avoid sweets  ?Exercise: She is trying walk everyday. Approx 1.5 miles every day   ?Sleep:Hard time falling asleep. Denies sleep maintenance issues. States she is getting about 4-5 hours per night. She gets up to check on baby as well ?Mood:Okay. "I can be easily agitated." Reports more nervous and anxiety  ? ?States she needs a letter to say she does not have any restrictions for work  ? ?Depression: Phq 9 is  positive ? ?  08/21/2021  ? 10:54 AM 04/25/2021  ?  9:42 AM 04/25/2021  ?  9:31 AM 02/23/2021  ?  4:13 PM 09/22/2020  ?  2:11 PM  ?Depression screen PHQ 2/9  ?Decreased Interest 2 1 2 1  0  ?Down, Depressed, Hopeless 1 1 1  0 0  ?PHQ - 2 Score 3 2 3 1  0  ?Altered sleeping 3 2 3 3  0  ?Tired, decreased energy 2 1 3 2  0  ?Change in appetite 3 1 2 2  0  ?Feeling bad or failure about yourself  1 1 1 1  0  ?Trouble concentrating 1 0 1 1 0  ?Moving slowly or fidgety/restless 0 1 0 0 0  ?Suicidal thoughts 0 1 0 0 0  ?PHQ-9 Score 13 9 13 10  0  ?Difficult doing work/chores Somewhat difficult Not difficult at all Somewhat difficult Somewhat difficult Not difficult at all  ? ?Hypertension: ?BP Readings from Last 3 Encounters:  ?08/21/21 115/79  ?04/25/21 128/82  ?02/23/21 129/85  ? ?Obesity: ?Wt Readings from Last 3 Encounters:  ?08/21/21 241 lb 3.2 oz (109.4 kg)  ?04/25/21 240 lb (108.9 kg)  ?02/23/21 237 lb 9.6 oz (107.8 kg)  ? ?BMI Readings from Last 3 Encounters:  ?08/21/21 46.13 kg/m?  ?04/25/21 45.90 kg/m?  ?02/23/21 45.44 kg/m?  ?  ? ?Vaccines:  ? ?HPV: aged out  ?Tdap: Up to date  ?Shingrix: not due  ?Pneumonia: not due   ?Flu: due in the fall ?COVID-19: Has received 2 vaccines  ? ? ?Hep C Screening: Completed  ?STD testing and prevention (HIV/chl/gon/syphilis): does not have any suspicion or cause for screening  ?Intimate partner violence: negative ?Sexual History : Monogamous long-term partner  ?Menstrual History/[name]/Abnormal Bleeding: [name] - last week. No concerns or abnormal bleeding  ?Discussed importance of follow up if any post-menopausal bleeding: not applicable ?Incontinence Symptoms: No   ? ?Breast cancer:  ?- Last Mammogram: Screening mammograms due to fibroadenoma and dense breast tissue.  ?- BRCA gene screening:  ? ?Osteoporosis Prevention : Discussed high calcium and vitamin D supplementation, weight bearing exercises ?Bone density :not applicable ? ?Cervical cancer screening: Next due in Dec 2023  ? ?Skin cancer: Discussed monitoring for atypical lesions  ?Colorectal cancer: Reviewed importance of screening at appropriate age, taking into account family history and presentation of worrisome symptoms    ?Lung cancer:  Low Dose CT Chest recommended if Age 65-80 years, 20 pack-year currently smoking OR have quit w/in 15years. Patient does not qualify.   ?ECG: NA today ? ?  Advanced Care Planning: A voluntary discussion about advance care planning including the explanation and discussion of advance directives.  Discussed health care proxy and Living will, and the patient was able to identify a health care proxy as .  Patient does not have someone designated as proxy or advanced planning completed  ? ?Lipids: ?Lab Results  ?Component Value Date  ? CHOL 173 04/17/2021  ? CHOL 180 09/22/2020  ? ?Lab Results  ?Component Value Date  ? HDL 40 04/17/2021  ? HDL 46 (L) 09/22/2020  ? ?Lab Results  ?Component Value Date  ? LDLCALC 111 (H) 04/17/2021  ? LDLCALC 105 (H) 09/22/2020  ? ?Lab Results  ?Component Value Date  ? TRIG 124 04/17/2021  ? TRIG 173 (H) 09/22/2020  ? ?Lab Results  ?Component Value Date  ? CHOLHDL 4.3 04/17/2021  ?  CHOLHDL 3.9 09/22/2020  ? ?No results found for: LDLDIRECT ? ?Glucose: ?Glucose  ?Date Value Ref Range Status  ?04/17/2021 99 70 - 99 mg/dL Final  ? ?Glucose, Bld  ?Date Value Ref Range Status  ?09/22/2020 103 65 - 139 mg/dL Final  ?  Comment:  ?  . ?       Non-fasting reference interval ?. ?  ?09/19/2019 117 (H) 70 - 99 mg/dL Final  ?  Comment:  ?  Glucose reference range applies only to samples taken after fasting for at least 8 hours.  ? ?Glucose-Capillary  ?Date Value Ref Range Status  ?09/20/2019 90 70 - 99 mg/dL Final  ?  Comment:  ?  Glucose reference range applies only to samples taken after fasting for at least 8 hours.  ?09/19/2019 129 (H) 70 - 99 mg/dL Final  ?  Comment:  ?  Glucose reference range applies only to samples taken after fasting for at least 8 hours.  ?09/19/2019 92 70 - 99 mg/dL Final  ?  Comment:  ?  Glucose reference range applies only to samples taken after fasting for at least 8 hours.  ? ? ?Patient Active Problem List  ? Diagnosis Date Noted  ? Class 3 severe obesity without serious comorbidity with body mass index (BMI) of 40.0 to 44.9 in adult, unspecified obesity type ([place]) 08/21/2021  ? 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  ? 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  ? Nexplanon in place 10/20/2019  ? Postpartum depression & anxiety 10/20/2019  ? History of gestational hypertension 09/21/2019  ? History of gestational diabetes 06/24/2019  ? ? ?Past Surgical History:  ?Procedure Laterality Date  ? BREAST LUMPECTOMY Right 2014  ? right breast-benign  ? removal rt palm lesion    ? US BREAST BIOPSY ([place] [facility]) Right 04/19/2021  ? ribbon 3:00 path pending  ? ? ?Family History  ?Problem Relation Age of Onset  ? Cancer Maternal Grandfather   ?     pancreatic  ? Cancer Paternal Grandmother   ?     brain  ? Stroke Paternal Grandfather   ? Breast cancer Cousin   ? ? ?Social History  ? ?Socioeconomic History  ? Marital status: Married  ?  Spouse name: Not on file  ? Number of children: 1  ? Years of  education: Not on file  ? Highest education level: Not on file  ?Occupational History  ? Not on file  ?Tobacco Use  ? Smoking status: Former  ? Smokeless tobacco: Never  ?Vaping Use  ? Vaping Use: Never used  ?Substance and Sexual Activity  ? Alcohol use: Never  ? Drug use: Never  ?  Sexual activity: Yes  ?  Birth control/protection: Implant  ?Other Topics Concern  ? Not on file  ?Social History Narrative  ? Married for 12 months.Lives with husband and step-daughter.Originally from [place].  ? ?Social Determinants of Health  ? ?Financial Resource Strain: Not on file  ?Food Insecurity: Not on file  ?Transportation Needs: Not on file  ?Physical Activity: Not on file  ?Stress: Not on file  ?Social Connections: Not on file  ?Intimate Partner Violence: Not on file  ? ? ? ?Current Outpatient Medications:  ?  albuterol (VENTOLIN HFA) 108 (90 Base) MCG/ACT inhaler, Inhale 2 puffs into the lungs every 6 (six) hours as needed for wheezing or shortness of breath., Disp: 18 g, Rfl: 6 ? ?No Known Allergies ? ? ?Review of Systems  ?Constitutional:  Negative for chills, fever, malaise/fatigue and weight loss.  ?HENT:  Negative for congestion, ear pain, hearing loss, sinus pain, sore throat and tinnitus.   ?Eyes:  Negative for blurred vision, double vision, photophobia and pain.  ?Respiratory:  Negative for cough, shortness of breath and wheezing.   ?Cardiovascular:  Negative for chest pain, palpitations and leg swelling.  ?Gastrointestinal:  Positive for constipation. Negative for diarrhea, heartburn, nausea and vomiting.  ?Genitourinary:  Negative for dysuria, frequency, hematuria and urgency.  ?Musculoskeletal:  Negative for back pain, falls, joint pain and myalgias.  ?Skin:  Negative for itching and rash.  ?Neurological:  Negative for dizziness, tingling, tremors, loss of consciousness, weakness and headaches.  ?Psychiatric/Behavioral:  Negative for depression and suicidal ideas. The patient is nervous/anxious and has insomnia.    ? ? ? ?Objective ? ?Vitals:  ? 08/21/21 1004  ?BP: 115/79  ?Pulse: 76  ?Temp: 98.3 ?F (36.8 ?C)  ?TempSrc: Oral  ?SpO2: 98%  ?Weight: 241 lb 3.2 oz (109.4 kg)  ?Height: 5' 0.63" (1.54 m)  ? ? ?Body mass ind

## 2021-08-21 NOTE — Assessment & Plan Note (Signed)
Chronic, historic condition ?Reviewed and encouraged current efforts to exercise  ?Discussed dietary recommendations appropriate for reducing CVD risk and hyperlipidemia.  ?Follow up for monitoring ? ?

## 2021-08-28 ENCOUNTER — Ambulatory Visit (INDEPENDENT_AMBULATORY_CARE_PROVIDER_SITE_OTHER): Payer: Medicaid Other | Admitting: Dermatology

## 2021-08-28 DIAGNOSIS — L578 Other skin changes due to chronic exposure to nonionizing radiation: Secondary | ICD-10-CM | POA: Diagnosis not present

## 2021-08-28 DIAGNOSIS — L821 Other seborrheic keratosis: Secondary | ICD-10-CM

## 2021-08-28 DIAGNOSIS — D369 Benign neoplasm, unspecified site: Secondary | ICD-10-CM

## 2021-08-28 DIAGNOSIS — D235 Other benign neoplasm of skin of trunk: Secondary | ICD-10-CM | POA: Diagnosis not present

## 2021-08-28 DIAGNOSIS — D1800 Hemangioma unspecified site: Secondary | ICD-10-CM

## 2021-08-28 DIAGNOSIS — D225 Melanocytic nevi of trunk: Secondary | ICD-10-CM | POA: Diagnosis not present

## 2021-08-28 DIAGNOSIS — D2370 Other benign neoplasm of skin of unspecified lower limb, including hip: Secondary | ICD-10-CM | POA: Diagnosis not present

## 2021-08-28 DIAGNOSIS — D229 Melanocytic nevi, unspecified: Secondary | ICD-10-CM

## 2021-08-28 DIAGNOSIS — D2362 Other benign neoplasm of skin of left upper limb, including shoulder: Secondary | ICD-10-CM

## 2021-08-28 DIAGNOSIS — D489 Neoplasm of uncertain behavior, unspecified: Secondary | ICD-10-CM

## 2021-08-28 DIAGNOSIS — D239 Other benign neoplasm of skin, unspecified: Secondary | ICD-10-CM

## 2021-08-28 DIAGNOSIS — L732 Hidradenitis suppurativa: Secondary | ICD-10-CM

## 2021-08-28 MED ORDER — DOXYCYCLINE MONOHYDRATE 50 MG PO CAPS: 50.0000 mg | ORAL_CAPSULE | Freq: Every day | ORAL | 3 refills | Status: DC

## 2021-08-28 MED ORDER — SPIRONOLACTONE 25 MG PO TABS: 25.0000 mg | ORAL_TABLET | ORAL | 3 refills | Status: DC

## 2021-08-28 NOTE — Progress Notes (Unsigned)
New Patient Visit  Subjective  Julia Blanchard is a 31 y.o. female who presents for the following: New Patient (Initial Visit) (Concerned with spots at chest, back , arms, and shoulder. Reports possible cyst under breast. Patient denies personal or family history of skin cancer. ). The patient has spots, moles and lesions to be evaluated, some may be new or changing and the patient has concerns that these could be cancer.  The following portions of the chart were reviewed this encounter and updated as appropriate:   Tobacco  Allergies  Meds  Problems  Med Hx  Surg Hx  Fam Hx     Review of Systems:  No other skin or systemic complaints except as noted in HPI or Assessment and Plan.  Objective  Well appearing patient in no apparent distress; mood and affect are within normal limits.  A focused examination was performed including back, chest, b/l arms, face, neck, left thigh . Relevant physical exam findings are noted in the Assessment and Plan.  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  R/O Fabry Disease left upper back and left tricep; thigh  Hx of benign lumps and cyst at breast removed.  No hx of heart problems; pain; kidney disease; cerebral disease No hx of rash below waist  No hx Fabry Disease   Recommend U/A  to r/o Fabry Disease Discussed Genetic testing   Discussed depending on findings could treat Angiofibromas with laser therapy. Not typically covered by insurance - $350 per laser session.   Fabry disease is a rare X-linked lysosomal storage disorder in which deficiency of alpha-galactosidase A (alpha-Gal A) leads to an accumulation of glycosphingolipids within lysosomes.  While any organ may be affected by this disorder, the kidneys, skin, heart, and brain are  most commonly affected Signs and symptoms are myriad and highly variable. 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  Neoplasm of uncertain behavior left mid back at braline Epidermal / dermal shaving  Lesion diameter (cm):  0.6 Informed consent: discussed and consent obtained   Timeout: patient name, date of birth, surgical site, and procedure verified   Procedure prep:  Patient was prepped and draped in usual sterile fashion Prep type:  Isopropyl alcohol Anesthesia: the lesion was anesthetized in a standard fashion   Anesthetic:  1% lidocaine w/ epinephrine 1-100,000 buffered w/ 8.4% NaHCO3 Instrument used: flexible razor blade   Hemostasis achieved with: pressure, aluminum chloride and electrodesiccation   Outcome: patient tolerated procedure well   Post-procedure details: sterile dressing applied and wound care instructions given   Dressing type: bandage and petrolatum    Specimen 1 - Surgical pathology Differential Diagnosis: R/o irritated nevus vs dysplastic  Check Margins: No R/o irritated nevus vs dysplastic   Hidradenitis suppurativa b/l axilla and inner thighs  Hidradenitis Suppurativa is a chronic; persistent; non-curable, but treatable condition due to abnormal inflamed sweat glands in the body folds (axilla, inframammary, groin, medial thighs), causing recurrent painful draining  cysts and scarring. It can be associated with severe scarring acne and cysts; also abscesses and scarring of scalp. The goal is control and prevention of flares, as it is not curable. Scars are permanent and can be thickened. Treatment may include daily  use of topical medication and oral antibiotics.  Oral isotretinoin may also be helpful.  For more severe cases, Humira (a biologic injection) may be prescribed to decrease the inflammatory process and prevent flares.  When indicated, inflamed cysts may also be treated surgically.  Pamphlet given to patient.   Patient is currenty on birth control and is not planning to get pregnant right now  Start spironolactone 25 mg tablet - take 1 tab po BH Start doxycycline 50 mg capsule - take 1 cap po daily with supper  Doxycycline should be taken with food to prevent nausea. Do not lay down for 30 minutes after taking. Be cautious with sun exposure and use good sun protection while on this medication. Pregnant women should not take this medication.   Spironolactone can cause increased urination and cause blood pressure to decrease. Please watch for signs of lightheadedness and be cautious when changing position. It can sometimes cause breast tenderness or an irregular period in premenopausal women. It can also increase potassium. The increase in potassium usually is not a concern unless you are taking other medicines that also increase potassium, so please be sure your doctor knows all of the other medications you are taking. This medication should not be taken by pregnant women.  This medicine should also not be taken together with sulfa drugs like Bactrim (trimethoprim/sulfamethexazole).   spironolactone (ALDACTONE) 25 MG tablet - b/l axilla and inner thighs Take 1 tablet (25 mg total) by mouth every morning.  doxycycline (MONODOX) 50 MG capsule - b/l axilla and inner thighs Take 1 capsule (50 mg total) by mouth daily with supper. Take with food and drink.  Do not lay down for 30 minutes after taking. Be cautious with sun exposure and use good sun protection while on this medication.  Hemangiomas - Red papules - Discussed benign nature - Observe - Call for any changes  Seborrheic Keratoses -  Stuck-on, waxy, tan-brown papules and/or plaques  - Benign-appearing - Discussed benign etiology and prognosis. - Observe - Call for any changes  Melanocytic Nevi - Tan-brown and/or pink-flesh-colored symmetric macules and papules - Benign appearing on exam today - Observation - Call clinic for new or changing moles - Recommend daily use of broad spectrum spf 30+ sunscreen to sun-exposed areas.   Actinic Damage - chronic, secondary to cumulative UV radiation exposure/sun exposure over time - diffuse scaly erythematous macules with underlying dyspigmentation - Recommend daily broad spectrum sunscreen SPF 30+ to sun-exposed areas, reapply every 2 hours as needed.  - Recommend staying in the shade or wearing long sleeves, sun glasses (UVA+UVB protection) and wide brim hats (4-inch brim around the entire circumference of the hat). - Call for new or changing lesions.  Return for 2 - 3 month follow up on hs, angiokeratomas .  IRuthell Rummage, CMA, am acting as scribe for Sarina Ser, MD. Documentation: I have reviewed the above documentation for accuracy and completeness, and I agree with the above.  Sarina Ser, MD

## 2021-08-28 NOTE — Patient Instructions (Addendum)
Spironolactone can cause increased urination and cause blood pressure to decrease. Please watch for signs of lightheadedness and be cautious when changing position. It can sometimes cause breast tenderness or an irregular period in premenopausal women. It can also increase potassium. The increase in potassium usually is not a concern unless you are taking other medicines that also increase potassium, so please be sure your doctor knows all of the other medications you are taking. This medication should not be taken by pregnant women.  This medicine should also not be taken together with sulfa drugs like Bactrim (trimethoprim/sulfamethexazole).   Doxycycline should be taken with food to prevent nausea. Do not lay down for 30 minutes after taking. Be cautious with sun exposure and use good sun protection while on this medication. Pregnant women should not take this medication.   Biopsy Wound Care Instructions  Leave the original bandage on for 24 hours if possible.  If the bandage becomes soaked or soiled before that time, it is OK to remove it and examine the wound.  A small amount of post-operative bleeding is normal.  If excessive bleeding occurs, remove the bandage, place gauze over the site and apply continuous pressure (no peeking) over the area for 30 minutes. If this does not work, please call our clinic as soon as possible or page your doctor if it is after hours.   Once a day, cleanse the wound with soap and water. It is fine to shower. If a thick crust develops you may use a Q-tip dipped into dilute hydrogen peroxide (mix 1:1 with water) to dissolve it.  Hydrogen peroxide can slow the healing process, so use it only as needed.    After washing, apply petroleum jelly (Vaseline) or an antibiotic ointment if your doctor prescribed one for you, followed by a bandage.    For best healing, the wound should be covered with a layer of ointment at all times. If you are not able to keep the area covered  with a bandage to hold the ointment in place, this may mean re-applying the ointment several times a day.  Continue this wound care until the wound has healed and is no longer open.   Itching and mild discomfort is normal during the healing process. However, if you develop pain or severe itching, please call our office.   If you have any discomfort, you can take Tylenol (acetaminophen) or ibuprofen as directed on the bottle. (Please do not take these if you have an allergy to them or cannot take them for another reason).  Some redness, tenderness and white or yellow material in the wound is normal healing.  If the area becomes very sore and red, or develops a thick yellow-green material (pus), it may be infected; please notify us.    If you have stitches, return to clinic as directed to have the stitches removed. You will continue wound care for 2-3 days after the stitches are removed.   Wound healing continues for up to one year following surgery. It is not unusual to experience pain in the scar from time to time during the interval.  If the pain becomes severe or the scar thickens, you should notify the office.    A slight amount of redness in a scar is expected for the first six months.  After six months, the redness will fade and the scar will soften and fade.  The color difference becomes less noticeable with time.  If there are any problems, return for a  post-op surgery check at your earliest convenience.  To improve the appearance of the scar, you can use silicone scar gel, cream, or sheets (such as Mederma or Serica) every night for up to one year. These are available over the counter (without a prescription).  Please call our office at [phone] for any questions or concerns.     If You Need Anything After Your Visit  If you have any questions or concerns for your doctor, please call our main line at [phone] and press option 4 to reach your doctor's medical assistant. If no  one answers, please leave a voicemail as directed and we will return your call as soon as possible. Messages left after 4 pm will be answered the following business day.   You may also send [place] a message via [place]. We typically respond to MyChart messages within 1-2 business days.  For prescription refills, please ask your pharmacy to contact our office. Our fax number is [phone].  If you have an urgent issue when the clinic is closed that cannot wait until the next business day, you can page your doctor at the number below.    Please note that while we do our best to be available for urgent issues outside of office hours, we are not available 24/7.   If you have an urgent issue and are unable to reach [place], you may choose to seek medical care at your doctor's office, retail clinic, urgent care center, or emergency room.  If you have a medical emergency, please immediately call 911 or go to the emergency department.  Pager Numbers  - Dr. Nehemiah Massed: [phone]  - Dr. Laurence Ferrari: [phone]  - Dr. Nicole Kindred: [phone]  In the event of inclement weather, please call our main line at [phone] for an update on the status of any delays or closures.  Dermatology Medication Tips: Please keep the boxes that topical medications come in in order to help keep track of the instructions about where and how to use these. Pharmacies typically print the medication instructions only on the boxes and not directly on the medication tubes.   If your medication is too expensive, please contact our office at [phone] option 4 or send [place] a message through [place].   We are unable to tell what your co-pay for medications will be in advance as this is different depending on your insurance coverage. However, we may be able to find a substitute medication at lower cost or fill out paperwork to get insurance to cover a needed medication.   If a prior authorization is required to get your medication  covered by your insurance company, please allow [place] 1-2 business days to complete this process.  Drug prices often vary depending on where the prescription is filled and some pharmacies may offer cheaper prices.  The website www.goodrx.com contains coupons for medications through different pharmacies. The prices here do not account for what the cost may be with help from insurance (it may be cheaper with your insurance), but the website can give you the price if you did not use any insurance.  - You can print the associated coupon and take it with your prescription to the pharmacy.  - You may also stop by our office during regular business hours and pick up a GoodRx coupon card.  - If you need your prescription sent electronically to a different pharmacy, notify our office through [facility] or by phone at [phone] option 4.     Si Usted  Necesita Algo Despus de Su Visita  Tambin puede enviarnos un mensaje a travs de [place]. Por lo general respondemos a los mensajes de MyChart en el transcurso de 1 a 2 das hbiles.  Para renovar recetas, por favor pida a su farmacia que se ponga en contacto con nuestra oficina. Harland Dingwall de fax es [place] [phone].  Si tiene un asunto urgente cuando la clnica est cerrada y que no puede esperar hasta el siguiente da hbil, puede llamar/localizar a su doctor(a) al nmero que aparece a continuacin.   Por favor, tenga en cuenta que aunque hacemos todo lo posible para estar disponibles para asuntos urgentes fuera del horario de [place], no estamos disponibles las 24 horas del da, los 7 das de la [place].   Si tiene un problema urgente y no puede comunicarse con nosotros, puede optar por buscar atencin mdica  en el consultorio de su doctor(a), en una clnica privada, en un centro de atencin urgente o en una sala de emergencias.  Si tiene Engineering geologist, por favor llame inmediatamente al 911 o vaya a la sala de  emergencias.  Nmeros de bper  - Dr. Nehemiah Massed: [phone]  - Dra. Moye: [phone]  - Dra. Nicole Kindred: [phone]  En caso de inclemencias del [place], por favor llame a Johnsie Kindred principal al [phone] para una actualizacin sobre el [place] de cualquier retraso o cierre.  Consejos para la medicacin en dermatologa: Por favor, guarde las cajas en las que vienen los medicamentos de uso tpico para ayudarle a seguir las instrucciones sobre dnde y cmo usarlos. Las farmacias generalmente imprimen las instrucciones del medicamento slo en las cajas y no directamente en los tubos del [place].   Si su medicamento es muy caro, por favor, pngase en contacto con Zigmund Daniel llamando al [phone] y presione la opcin 4 o envenos un mensaje a travs de Pharmacist, community.   No podemos decirle cul ser su copago por los medicamentos por adelantado ya que esto es diferente dependiendo de la cobertura de su seguro. Sin embargo, es posible que podamos encontrar un medicamento sustituto a Electrical engineer un formulario para que el seguro cubra el medicamento que se considera necesario.   Si se requiere una autorizacin previa para que su compaa de seguros [place] su medicamento, por favor permtanos de 1 a 2 das hbiles para completar este proceso.  Los precios de los medicamentos varan con frecuencia dependiendo del Environmental consultant de dnde se surte la receta y alguna farmacias pueden ofrecer precios ms baratos.  El sitio web www.goodrx.com tiene cupones para medicamentos de Airline pilot. Los precios aqu no tienen en cuenta lo que podra costar con la ayuda del seguro (puede ser ms barato con su seguro), pero el sitio web puede darle el precio si no utiliz Research scientist (physical sciences).  - Puede imprimir el cupn correspondiente y llevarlo con su receta a la farmacia.  - Tambin puede pasar por nuestra oficina durante el horario de atencin regular y Charity fundraiser una tarjeta de cupones de GoodRx.  - Si  necesita que su receta se enve electrnicamente a una farmacia diferente, informe a nuestra oficina a travs de MyChart de [place] o por telfono llamando al [phone] y presione la opcin 4.

## 2021-08-29 DIAGNOSIS — D239 Other benign neoplasm of skin, unspecified: Secondary | ICD-10-CM | POA: Diagnosis not present

## 2021-08-30 ENCOUNTER — Encounter: Payer: Self-pay | Admitting: Dermatology

## 2021-08-30 LAB — URINALYSIS
Bilirubin, UA: NEGATIVE
Glucose, UA: NEGATIVE
Ketones, UA: NEGATIVE
Nitrite, UA: NEGATIVE
Protein,UA: NEGATIVE
RBC, UA: NEGATIVE
Specific Gravity, UA: 1.027 (ref 1.005–1.030)
Urobilinogen, Ur: 0.2 mg/dL (ref 0.2–1.0)
pH, UA: 5.5 (ref 5.0–7.5)

## 2021-09-05 ENCOUNTER — Telehealth: Payer: Self-pay

## 2021-09-05 NOTE — Telephone Encounter (Signed)
-----   Message from Ralene Bathe, MD sent at 08/30/2021  5:52 PM EDT ----- Urinalysis from 08/30/21 normal except for 2+ Leukocytes. No evidence of Proteinuria  Recommend Genetic Testing to further evaluate for possibility of Fabry Disease.  Please schedule for [facility] for Genetic Testing - to R/O Fabry Disease  Send note to pts PCP re- 08/28/21 Dermatology visit note and Urinalysis result. PCP can determine if anything concerning about Urinalysis that needs further evaluation.  Pt should keep 11/09/21 follow up appt.

## 2021-09-05 NOTE — Telephone Encounter (Signed)
Left message on voicemail to return my call.  

## 2021-09-07 ENCOUNTER — Telehealth: Payer: Self-pay

## 2021-09-07 DIAGNOSIS — D239 Other benign neoplasm of skin, unspecified: Secondary | ICD-10-CM

## 2021-09-07 NOTE — Telephone Encounter (Signed)
-----   Message from Ralene Bathe, MD sent at 08/30/2021  5:52 PM EDT ----- Urinalysis from 08/30/21 normal except for 2+ Leukocytes. No evidence of Proteinuria  Recommend Genetic Testing to further evaluate for possibility of Fabry Disease.  Please schedule for [facility] Dept for Genetic Testing - to R/O Fabry Disease  Send note to pts PCP re- 08/28/21 Dermatology visit note and Urinalysis result. PCP can determine if anything concerning about Urinalysis that needs further evaluation.  Pt should keep 11/09/21 follow up appt.

## 2021-09-07 NOTE — Telephone Encounter (Signed)
Patient advised of results.  Office note and lab forwarded to PCP.   Genetic testing referral placed.

## 2021-09-26 ENCOUNTER — Telehealth: Payer: Self-pay

## 2021-09-26 DIAGNOSIS — D239 Other benign neoplasm of skin, unspecified: Secondary | ICD-10-CM

## 2021-09-26 NOTE — Telephone Encounter (Signed)
Patient called about her referral for genetic testing. Order was placed back on June 1st. I have reached out to [place] for help on this referral.   Left msg with patient that I will follow up with her again as soon as I find out about her referral. aw

## 2021-09-27 NOTE — Addendum Note (Signed)
Addended by: Johnsie Kindred R on: 09/27/2021 03:19 PM   Modules accepted: Orders

## 2021-09-27 NOTE — Telephone Encounter (Signed)
Spoke with patient and she did not have preference on referral. I have completed the fax form to [facility] and faxed with office notes to [phone] ([place] Dept).   Patient advised if she has not heard from [facility] regarding referral in 1-2 weeks to follow up with me. aw

## 2021-09-27 NOTE — Telephone Encounter (Signed)
Spoke with someone in the [facility] team. [facility] does not specialize in adult metabolic disorders but gave me the information to refer patient to either [facility] or [facility]. I wanted to check with you if you had a preference on where you would like for the patient to go?

## 2021-10-18 ENCOUNTER — Telehealth: Payer: Self-pay

## 2021-10-18 NOTE — Telephone Encounter (Signed)
Called [facility] to check on referral for genetics. Spoke with [place] Curator) who states the referral is still under review. They are changing some things in their office so that is why referral is taking longer then normal to schedule. She advised to call back if patient has not heard in another 1-2 weeks to speak to [place] or [place] at [phone].  Called patient and advised her of information as well. aw

## 2021-10-23 ENCOUNTER — Telehealth: Payer: Self-pay

## 2021-10-23 NOTE — Telephone Encounter (Signed)
[facility] faxed over referral denial today. They state we need to refer internally to our [place]. This patient's referral has already been denied by [facility] as they do not see patients to rule out Fabry Disease. [place] to see if we can get patient in with them but had to fax referral information to [phone]. Left patient voicemail advising her of change. aw

## 2021-11-09 ENCOUNTER — Ambulatory Visit: Payer: Medicaid Other | Admitting: Dermatology

## 2021-11-20 ENCOUNTER — Encounter: Payer: Self-pay | Admitting: Dermatology

## 2021-11-20 ENCOUNTER — Ambulatory Visit: Payer: Medicaid Other | Admitting: Dermatology

## 2021-11-20 DIAGNOSIS — L732 Hidradenitis suppurativa: Secondary | ICD-10-CM | POA: Diagnosis not present

## 2021-11-20 DIAGNOSIS — D2362 Other benign neoplasm of skin of left upper limb, including shoulder: Secondary | ICD-10-CM

## 2021-11-20 DIAGNOSIS — D239 Other benign neoplasm of skin, unspecified: Secondary | ICD-10-CM

## 2021-11-20 DIAGNOSIS — D235 Other benign neoplasm of skin of trunk: Secondary | ICD-10-CM

## 2021-11-20 MED ORDER — SPIRONOLACTONE 25 MG PO TABS: ORAL_TABLET | ORAL | 3 refills | Status: DC

## 2021-11-20 MED ORDER — DOXYCYCLINE MONOHYDRATE 100 MG PO CAPS: ORAL_CAPSULE | ORAL | 1 refills | Status: DC

## 2021-11-20 NOTE — Progress Notes (Signed)
Follow-Up Visit   Subjective  Julia Blanchard is a 31 y.o. female who presents for the following: hidradenitis suppurativa (B/L axilla and inner thigh/groin folds. 3 month recheck. Has been taking Spironolactone 25 mg every day at bedtime and Doxycycline 50 mg daily with dinner. States she is not getting as many new lesions. Stays clear for 1-2 week between flares now, always had an active lesion in the past) and Follow-up (Angiokeratomas. Left upper back, left tricep area. [facility] and [employer] both denied the request for genetic testing to R/O Fabry disease. Fax was sent to [facility] 10/23/2021, unsure of outcome). The patient has spots, moles and lesions to be evaluated, some may be new or changing and the patient has concerns that these could be cancer.  The following portions of the chart were reviewed this encounter and updated as appropriate:  Tobacco  Allergies  Meds  Problems  Med Hx  Surg Hx  Fam Hx     Review of Systems: No other skin or systemic complaints except as noted in HPI or Assessment and Plan.  Objective  Well appearing patient in no apparent distress; mood and affect are within normal limits.  A focused examination was performed including face, axillae, groin, back, arms. Relevant physical exam findings are noted in the Assessment and Plan.  Axillae, medial thighs Scarring, several hyperpigmented lesions, active lesion at left axilla  Left Upper Back, left deltoid purple fibrous papules   Assessment & Plan  Hidradenitis suppurativa Axillae, medial thighs Chronic and persistent condition with duration or expected duration over one year. Condition is symptomatic/ bothersome to patient. Not currently at goal.  May need to consider Isotretinoin therapy in the future.  Has Nexplanon birth control implant  Increase Doxycycline 100 mg 1 capsule once daily with dinner. #90 1Rf Increase Spironolactone 25 mg 2 tablets at bedtime.  Doxycycline should be taken with  food to prevent nausea. Do not lay down for 30 minutes after taking. Be cautious with sun exposure and use good sun protection while on this medication. Pregnant women should not take this medication.   Spironolactone can cause increased urination and cause blood pressure to decrease. Please watch for signs of lightheadedness and be cautious when changing position. It can sometimes cause breast tenderness or an irregular period in premenopausal women. It can also increase potassium. The increase in potassium usually is not a concern unless you are taking other medicines that also increase potassium, so please be sure your doctor knows all of the other medications you are taking. This medication should not be taken by pregnant women.  This medicine should also not be taken together with sulfa drugs like Bactrim (trimethoprim/sulfamethexazole).   doxycycline (MONODOX) 100 MG capsule - Axillae, medial thighs Take 1 capsule once daily with dinner  Related Medications spironolactone (ALDACTONE) 25 MG tablet Take 2 tablets once daily  Angiokeratoma Left Upper Back, left deltoid Discussed Genetic testing.  We had referred her to [facility] health genetic testing and I think they said they could not perform this testing.  Patient also referred to [facility] genetics.  We will follow-up on this.   Discussed depending on findings could treat Angiofibromas with laser therapy. Not typically covered by insurance - $350 per laser session.    Fabry disease is a rare X-linked lysosomal storage disorder in which deficiency of alpha-galactosidase A (alpha-Gal A) leads to an accumulation of glycosphingolipids within lysosomes.  While any organ may be affected by this disorder, the kidneys, skin, heart, and  brain are most commonly affected Signs and symptoms are myriad and highly variable. Pain is one of the earliest symptoms The second most common group of symptoms includes abdominal pain, nausea, vomiting, constipation, and  diarrhea cerebrovascular manifestations Chronic renal failure and cardiac disease are the most frequent causes of death  Heterozygous females may experience variable symptoms  Return in about 3 months (around 02/20/2022) for HS Recheck.  I, Emelia Salisbury, CMA, am acting as scribe for Sarina Ser, MD. Documentation: I have reviewed the above documentation for accuracy and completeness, and I agree with the above.  Sarina Ser, MD

## 2021-11-20 NOTE — Patient Instructions (Signed)
Increase Doxycycline 100 mg 1 capsule once daily with dinner. #90 1Rf Increase Spironolactone 25 mg 2 tablets at bedtime.  Doxycycline should be taken with food to prevent nausea. Do not lay down for 30 minutes after taking. Be cautious with sun exposure and use good sun protection while on this medication. Pregnant women should not take this medication.   Spironolactone can cause increased urination and cause blood pressure to decrease. Please watch for signs of lightheadedness and be cautious when changing position. It can sometimes cause breast tenderness or an irregular period in premenopausal women. It can also increase potassium. The increase in potassium usually is not a concern unless you are taking other medicines that also increase potassium, so please be sure your doctor knows all of the other medications you are taking. This medication should not be taken by pregnant women.  This medicine should also not be taken together with sulfa drugs like Bactrim (trimethoprim/sulfamethexazole).   Due to recent changes in healthcare laws, you may see results of your pathology and/or laboratory studies on MyChart before the doctors have had a chance to review them. We understand that in some cases there may be results that are confusing or concerning to you. Please understand that not all results are received at the same time and often the doctors may need to interpret multiple results in order to provide you with the best plan of care or course of treatment. Therefore, we ask that you please give [place] 2 business days to thoroughly review all your results before contacting the office for clarification. Should we see a critical lab result, you will be contacted sooner.   If You Need Anything After Your Visit  If you have any questions or concerns for your doctor, please call our main line at [phone] and press option 4 to reach your doctor's medical assistant. If no one answers, please leave a voicemail  as directed and we will return your call as soon as possible. Messages left after 4 pm will be answered the following business day.   You may also send [place] a message via [place]. We typically respond to MyChart messages within 1-2 business days.  For prescription refills, please ask your pharmacy to contact our office. Our fax number is [phone].  If you have an urgent issue when the clinic is closed that cannot wait until the next business day, you can page your doctor at the number below.    Please note that while we do our best to be available for urgent issues outside of office hours, we are not available 24/7.   If you have an urgent issue and are unable to reach [place], you may choose to seek medical care at your doctor's office, retail clinic, urgent care center, or emergency room.  If you have a medical emergency, please immediately call 911 or go to the emergency department.  Pager Numbers  - Dr. Nehemiah Massed: [phone]  - Dr. Laurence Ferrari: [phone]  - Dr. Nicole Kindred: [phone]  In the event of inclement weather, please call our main line at [phone] for an update on the status of any delays or closures.  Dermatology Medication Tips: Please keep the boxes that topical medications come in in order to help keep track of the instructions about where and how to use these. Pharmacies typically print the medication instructions only on the boxes and not directly on the medication tubes.   If your medication is too expensive, please contact our office at [phone] option 4 or  send [place] a message through [place].   We are unable to tell what your co-pay for medications will be in advance as this is different depending on your insurance coverage. However, we may be able to find a substitute medication at lower cost or fill out paperwork to get insurance to cover a needed medication.   If a prior authorization is required to get your medication covered by your insurance company, please  allow [place] 1-2 business days to complete this process.  Drug prices often vary depending on where the prescription is filled and some pharmacies may offer cheaper prices.  The website www.goodrx.com contains coupons for medications through different pharmacies. The prices here do not account for what the cost may be with help from insurance (it may be cheaper with your insurance), but the website can give you the price if you did not use any insurance.  - You can print the associated coupon and take it with your prescription to the pharmacy.  - You may also stop by our office during regular business hours and pick up a GoodRx coupon card.  - If you need your prescription sent electronically to a different pharmacy, notify our office through [facility] or by phone at [phone] option 4.     Si Usted Necesita Algo Despus de Su Visita  Tambin puede enviarnos un mensaje a travs de Pharmacist, community. Por lo general respondemos a los mensajes de MyChart en el transcurso de 1 a 2 das hbiles.  Para renovar recetas, por favor pida a su farmacia que se ponga en contacto con nuestra oficina. Harland Dingwall de fax es [place] [phone].  Si tiene un asunto urgente cuando la clnica est cerrada y que no puede esperar hasta el siguiente da hbil, puede llamar/localizar a su doctor(a) al nmero que aparece a continuacin.   Por favor, tenga en cuenta que aunque hacemos todo lo posible para estar disponibles para asuntos urgentes fuera del horario de [place], no estamos disponibles las 24 horas del da, los 7 das de la [place].   Si tiene un problema urgente y no puede comunicarse con nosotros, puede optar por buscar atencin mdica  en el consultorio de su doctor(a), en una clnica privada, en un centro de atencin urgente o en una sala de emergencias.  Si tiene Engineering geologist, por favor llame inmediatamente al 911 o vaya a la sala de emergencias.  Nmeros de bper  - Dr. Nehemiah Massed:  [phone]  - Dra. Moye: [phone]  - Dra. Nicole Kindred: [phone]  En caso de inclemencias del [place], por favor llame a Johnsie Kindred principal al [phone] para una actualizacin sobre el [place] de cualquier retraso o cierre.  Consejos para la medicacin en dermatologa: Por favor, guarde las cajas en las que vienen los medicamentos de uso tpico para ayudarle a seguir las instrucciones sobre dnde y cmo usarlos. Las farmacias generalmente imprimen las instrucciones del medicamento slo en las cajas y no directamente en los tubos del [place].   Si su medicamento es muy caro, por favor, pngase en contacto con Zigmund Daniel llamando al [phone] y presione la opcin 4 o envenos un mensaje a travs de Pharmacist, community.   No podemos decirle cul ser su copago por los medicamentos por adelantado ya que esto es diferente dependiendo de la cobertura de su seguro. Sin embargo, es posible que podamos encontrar un medicamento sustituto a Electrical engineer un formulario para que el seguro cubra el medicamento que se considera necesario.   Si  se requiere una autorizacin previa para que su compaa de seguros [place] su medicamento, por favor permtanos de 1 a 2 das hbiles para completar este proceso.  Los precios de los medicamentos varan con frecuencia dependiendo del Environmental consultant de dnde se surte la receta y alguna farmacias pueden ofrecer precios ms baratos.  El sitio web www.goodrx.com tiene cupones para medicamentos de Airline pilot. Los precios aqu no tienen en cuenta lo que podra costar con la ayuda del seguro (puede ser ms barato con su seguro), pero el sitio web puede darle el precio si no utiliz Research scientist (physical sciences).  - Puede imprimir el cupn correspondiente y llevarlo con su receta a la farmacia.  - Tambin puede pasar por nuestra oficina durante el horario de atencin regular y Charity fundraiser una tarjeta de cupones de GoodRx.  - Si necesita que su receta se enve electrnicamente a  una farmacia diferente, informe a nuestra oficina a travs de MyChart de [place] o por telfono llamando al [phone] y presione la opcin 4.

## 2021-11-27 ENCOUNTER — Encounter: Payer: Self-pay | Admitting: Dermatology

## 2021-12-13 ENCOUNTER — Telehealth: Payer: Self-pay

## 2021-12-13 NOTE — Telephone Encounter (Signed)
Copied from [place] [phone]. Topic: Referral - Request for Referral >> Dec 13, 2021 12:11 PM Everette C wrote: Has patient seen PCP for this complaint? Yes.   *If NO, is insurance requiring patient see PCP for this issue before PCP can refer them? Referral for which specialty: Genetic Specialists  Preferred provider/office: Patient has no preference  Reason for referral: Patient would like DNA and/or genetic testing for their skin concerns

## 2021-12-13 NOTE — Telephone Encounter (Signed)
Copied from [place] [phone]. Topic: Referral - Request for Referral >> Dec 13, 2021 12:09 PM Everette C wrote: Has patient seen PCP for this complaint? Yes.   *If NO, is insurance requiring patient see PCP for this issue before PCP can refer them? Referral for which specialty: Dermatology  Preferred provider/office: [facility] - Dr Nehemiah Massed Reason for referral: continued skin care

## 2021-12-13 NOTE — Telephone Encounter (Signed)
Spoke with patient about the referrals that she is requesting. Made an appt for Monday with Erin Mecum, PA-C to discuss at 2:40pm

## 2021-12-18 ENCOUNTER — Encounter: Payer: Self-pay | Admitting: Physician Assistant

## 2021-12-18 ENCOUNTER — Ambulatory Visit (INDEPENDENT_AMBULATORY_CARE_PROVIDER_SITE_OTHER): Payer: Medicaid Other | Admitting: Physician Assistant

## 2021-12-18 VITALS — BP 108/78 | HR 81 | Temp 98.6°F | Ht 60.63 in | Wt 244.0 lb

## 2021-12-18 DIAGNOSIS — F4323 Adjustment disorder with mixed anxiety and depressed mood: Secondary | ICD-10-CM | POA: Insufficient documentation

## 2021-12-18 DIAGNOSIS — D239 Other benign neoplasm of skin, unspecified: Secondary | ICD-10-CM

## 2021-12-18 DIAGNOSIS — M20002 Unspecified deformity of left finger(s): Secondary | ICD-10-CM | POA: Diagnosis not present

## 2021-12-18 DIAGNOSIS — L732 Hidradenitis suppurativa: Secondary | ICD-10-CM | POA: Diagnosis not present

## 2021-12-18 DIAGNOSIS — D1800 Hemangioma unspecified site: Secondary | ICD-10-CM | POA: Diagnosis not present

## 2021-12-18 MED ORDER — VENLAFAXINE HCL 37.5 MG PO TABS: 37.5000 mg | ORAL_TABLET | Freq: Every day | ORAL | 1 refills | Status: DC

## 2021-12-18 NOTE — Progress Notes (Unsigned)
Established Patient Office Visit  Name: Julia Blanchard   MRN: [id number]    DOB: June 02, 1990   Date:12/19/2021  Today's Provider: Talitha Givens, MHS, PA-C Introduced myself to the patient as a PA-C and provided education on APPs in clinical practice.         Subjective  Chief Complaint  Chief Complaint  Patient presents with   Ortho referral    For left thumb, does not bend    Derm referral    Needs a new derm referral to continue going to [place] Symptoms include nervous/anxious behavior.       Left thumb Reports she feels like she cannot bend left thumb at middle knuckle space  States she feels like she can't feel middle joint in thumb like on her right thumb Onset: gradual  Duration: years.  She is not sure if she has had an injury to the area Pain: No pain  Alleviating:nothing  Aggravating: nothing    Derm referral  Dermatology would like to test for Fabry's disease but her referrals have been denied thus far  She would like this testing completed so she can start treatment if indicated      ANXIETY/STRESS Reports some perceived difficulty breathing States she is unsure of triggers Reports her son recently went to school and she has started working again   Duration:uncontrolled Anxious mood: yes  Excessive worrying: yes Irritability: yes  Sweating: no Nausea: no Palpitations:yes Hyperventilation: yes Panic attacks: yes Agoraphobia: no  Obscessions/compulsions: no Depressed mood: yes    01/12/22    2:40 PM 08/21/2021   10:54 AM 04/25/2021    9:42 AM 04/25/2021    9:31 AM 02/23/2021    4:13 PM  Depression screen PHQ 2/9  Decreased Interest '1 2 1 2 1  '$ Down, Depressed, Hopeless '1 1 1 1 '$ 0  PHQ - 2 Score '2 3 2 3 1  '$ Altered sleeping '3 3 2 3 3  '$ Tired, decreased energy '2 2 1 3 2  '$ Change in appetite '3 3 1 2 2  '$ Feeling bad or failure about yourself  '1 1 1 1 1  '$ Trouble concentrating 1 1 0 1 1  Moving  slowly or fidgety/restless 0 0 1 0 0  Suicidal thoughts 0 0 1 0 0  PHQ-9 Score '12 13 9 13 10  '$ Difficult doing work/chores Somewhat difficult Somewhat difficult Not difficult at all Somewhat difficult Somewhat difficult   Anhedonia: yes Weight changes: yes Insomnia: yes hard to fall asleep and stay asleep Hypersomnia: no Fatigue/loss of energy: yes Feelings of worthlessness: no Feelings of guilt: no Impaired concentration/indecisiveness: no Suicidal ideations: no  Crying spells: yes Recent Stressors/Life Changes: yes   Relationship problems: no   Family stress: yes  - son is going to day care    Financial stress: no    Job stress: no    Recent death/loss: no     01/12/2022    2:40 PM 08/21/2021   10:54 AM 04/25/2021    9:42 AM 04/25/2021    9:31 AM  GAD 7 : Generalized Anxiety Score  Nervous, Anxious, on Edge '3 2 1 3  '$ Control/stop worrying '3 2 1 2  '$ Worry too much - different things 2 2 0 2  Trouble relaxing 3 2 0 2  Restless '2 1 1 2  '$ Easily annoyed or irritable '3 3 1 3  '$ Afraid - awful might happen  $'2 3 2 'J$ 0  Total GAD 7 Score '18 15 6 14  '$ Anxiety Difficulty Extremely difficult Somewhat difficult Not difficult at all Somewhat difficult      Patient Active Problem List   Diagnosis Date Noted   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   Acute adjustment disorder with mixed anxiety and depressed mood 12/18/2021   Hidradenitis suppurativa 12/18/2021   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   Class 3 severe obesity without serious comorbidity with body mass index (BMI) of 40.0 to 44.9 in adult, unspecified obesity type ([place]) 08/21/2021   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   Nexplanon in place 10/20/2019   Postpartum depression & anxiety 10/20/2019   History of gestational hypertension 09/21/2019   History of gestational diabetes 06/24/2019    Past Surgical History:  Procedure Laterality Date   BREAST LUMPECTOMY Right 2014   right breast-benign    removal rt palm lesion     US BREAST BIOPSY ([place] HX) Right 04/19/2021   ribbon 3:00 path pending    Family History  Problem Relation Age of Onset   Cancer Maternal Grandfather        pancreatic   Cancer Paternal Grandmother        brain   Stroke Paternal Grandfather    Breast cancer Cousin     Social History   Tobacco Use   Smoking status: Former   Smokeless tobacco: Never  Substance Use Topics   Alcohol use: Never     Current Outpatient Medications:    venlafaxine (EFFEXOR) 37.5 MG tablet, Take 1 tablet (37.5 mg total) by mouth daily., Disp: 30 tablet, Rfl: 1   albuterol (VENTOLIN HFA) 108 (90 Base) MCG/ACT inhaler, Inhale 2 puffs into the lungs every 6 (six) hours as needed for wheezing or shortness of breath., Disp: 18 g, Rfl: 6   doxycycline (MONODOX) 100 MG capsule, Take 1 capsule once daily with dinner, Disp: 90 capsule, Rfl: 1   spironolactone (ALDACTONE) 25 MG tablet, Take 2 tablets once daily, Disp: 60 tablet, Rfl: 3  No Known Allergies  I personally reviewed active problem list, medication list, allergies, notes from last encounter, lab results with the patient/caregiver today.   Review of Systems  Constitutional:  Positive for malaise/fatigue.  Psychiatric/Behavioral:  Positive for depression. The patient is nervous/anxious.       Objective  Vitals:   12/18/21 1434  BP: 108/78  Pulse: 81  Temp: 98.6 F (37 C)  TempSrc: Oral  SpO2: 98%  Weight: 244 lb (110.7 kg)  Height: 5' 0.63" (1.54 m)    Body mass index is 46.67 kg/m.  Physical Exam Vitals reviewed.  Constitutional:      General: She is awake.     Appearance: Normal appearance. She is well-developed and well-groomed. She is morbidly obese.  HENT:     Head: Normocephalic and atraumatic.  Pulmonary:     Effort: Pulmonary effort is normal.     Breath sounds: Normal breath sounds.  Neurological:     General: No focal deficit present.     Mental Status: She is alert and oriented to  person, place, and time.     GCS: GCS eye subscore is 4. GCS verbal subscore is 5. GCS motor subscore is 6.     Cranial Nerves: Cranial nerves 2-12 are intact. No dysarthria or facial asymmetry.     Motor: Motor function is intact. No tremor.  Psychiatric:        Attention and Perception: Attention and perception  normal.        Mood and Affect: Mood and affect normal.        Speech: Speech normal.        Behavior: Behavior normal. Behavior is cooperative.        Cognition and Memory: Cognition normal.      No results found for this or any previous visit (from the past 2160 hour(s)).   PHQ2/9:    12/18/2021    2:40 PM 08/21/2021   10:54 AM 04/25/2021    9:42 AM 04/25/2021    9:31 AM 02/23/2021    4:13 PM  Depression screen PHQ 2/9  Decreased Interest '1 2 1 2 1  '$ Down, Depressed, Hopeless '1 1 1 1 '$ 0  PHQ - 2 Score '2 3 2 3 1  '$ Altered sleeping '3 3 2 3 3  '$ Tired, decreased energy '2 2 1 3 2  '$ Change in appetite '3 3 1 2 2  '$ Feeling bad or failure about yourself  '1 1 1 1 1  '$ Trouble concentrating 1 1 0 1 1  Moving slowly or fidgety/restless 0 0 1 0 0  Suicidal thoughts 0 0 1 0 0  PHQ-9 Score '12 13 9 13 10  '$ Difficult doing work/chores Somewhat difficult Somewhat difficult Not difficult at all Somewhat difficult Somewhat difficult      Fall Risk:    12/18/2021    2:40 PM 08/21/2021   10:06 AM 04/25/2021    9:42 AM 04/25/2021    9:31 AM 02/23/2021    4:12 PM  Fall Risk   Falls in the past year? 0 0 0 0 0  Number falls in past yr: 0 0 0 0 0  Injury with Fall? 0 0 0 0 0  Risk for fall due to : No Fall Risks No Fall Risks No Fall Risks No Fall Risks   Follow up Falls evaluation completed Falls evaluation completed Falls evaluation completed Falls evaluation completed Falls evaluation completed      Functional Status Survey:      Assessment & Plan  Problem List Items Addressed This Visit       Cardiovascular and Mediastinum   Angiokeratoma    Ongoing condition I have  reached out to her Dermatologist Dr. Nehemiah Massed to discuss his concerns for Fabry's Disease and need for testing. He is amenable to collaborating on testing and interpretation to evaluate for this I will place orders for testing at this time to rule out reach out to Derm to assist with dx.  Recommend she continue to see Dermatology for continued skin concerns - referral renewed today per request Follow up as needed pending test results.        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 Patient is currently managed for this by Dermatology She requests renewed referral for insurance reasons- provided this today Will continue collaboration with Derm as needed        Relevant Orders   Ambulatory referral to Dermatology   Acute adjustment disorder with mixed  anxiety and depressed mood - Primary    Acute, new concern She reports increased anxiety and intermittent panic She states she has had several changes to daily routine and child care but is not sure if these are correlated Reviewed PHQ9 and GAD7 with her today  She is amenable to trying an SNRI in attempt to avoid excess weight gain and other undesired side effects Will start Venlafaxine 37.5 mg PO QD at this time  Recommend she return for follow up in 6 weeks for monitoring and to assess response to medication       Relevant Medications   venlafaxine (EFFEXOR) 37.5 MG tablet   Deformity of left thumb joint    Patient reports this is chronic and she is unsure of inciting event or  trauma PE was concerning for reduced active ROM at DIP - passive ROM is partially intact but limited due to discomfort Offered to order xray but patient would prefer to have these performed at Ortho to reduce number of visits Will place referral to Ortho per pt request - coordination as indicated and requested  Follow up as needed       Relevant Orders   Ambulatory referral to Orthopedic Surgery     Return in about 6 weeks (around 01/29/2022) for Adjustment disorder .   I, Shanyla Marconi E Romonda Parker, PA-C, have reviewed all documentation for this visit. The documentation on 12/19/21 for the exam, diagnosis, procedures, and orders are all accurate and complete.   Talitha Givens, MHS, PA-C [facility]

## 2021-12-19 DIAGNOSIS — D239 Other benign neoplasm of skin, unspecified: Secondary | ICD-10-CM | POA: Insufficient documentation

## 2021-12-19 NOTE — Assessment & Plan Note (Signed)
Appears to be chronic condition Patient is currently managed for this by Dermatology She requests renewed referral for insurance reasons- provided this today Will continue collaboration with Derm as needed

## 2021-12-19 NOTE — Assessment & Plan Note (Signed)
Ongoing condition I have reached out to her Dermatologist Dr. Nehemiah Massed to discuss his concerns for Fabry's Disease and need for testing. He is amenable to collaborating on testing and interpretation to evaluate for this I will place orders for testing at this time to rule out reach out to Derm to assist with dx.  Recommend she continue to see Dermatology for continued skin concerns - referral renewed today per request Follow up as needed pending test results.

## 2021-12-19 NOTE — Assessment & Plan Note (Signed)
Patient reports this is chronic and she is unsure of inciting event or trauma PE was concerning for reduced active ROM at DIP - passive ROM is partially intact but limited due to discomfort Offered to order xray but patient would prefer to have these performed at Ortho to reduce number of visits Will place referral to Ortho per pt request - coordination as indicated and requested  Follow up as needed

## 2021-12-19 NOTE — Assessment & Plan Note (Signed)
Acute, new concern She reports increased anxiety and intermittent panic She states she has had several changes to daily routine and child care but is not sure if these are correlated Reviewed PHQ9 and GAD7 with her today  She is amenable to trying an SNRI in attempt to avoid excess weight gain and other undesired side effects Will start Venlafaxine 37.5 mg PO QD at this time  Recommend she return for follow up in 6 weeks for monitoring and to assess response to medication

## 2021-12-21 ENCOUNTER — Other Ambulatory Visit: Payer: Medicaid Other

## 2021-12-21 DIAGNOSIS — D239 Other benign neoplasm of skin, unspecified: Secondary | ICD-10-CM

## 2021-12-26 ENCOUNTER — Other Ambulatory Visit: Payer: Self-pay | Admitting: Dermatology

## 2021-12-26 DIAGNOSIS — L732 Hidradenitis suppurativa: Secondary | ICD-10-CM

## 2021-12-29 LAB — ALPHA GALACTOSIDASE: Alpha-Galactosidase activity: 53.3 nmol/hr/mg prt (ref >35.5)

## 2021-12-29 LAB — GLA SEQUENCING

## 2022-01-05 ENCOUNTER — Telehealth: Payer: Medicaid Other | Admitting: Physician Assistant

## 2022-01-05 DIAGNOSIS — U071 COVID-19: Secondary | ICD-10-CM

## 2022-01-05 MED ORDER — ALBUTEROL SULFATE HFA 108 (90 BASE) MCG/ACT IN AERS: 1.0000 | INHALATION_SPRAY | RESPIRATORY_TRACT | 0 refills | Status: DC | PRN

## 2022-01-05 MED ORDER — NIRMATRELVIR/RITONAVIR (PAXLOVID)TABLET: 3.0000 | ORAL_TABLET | Freq: Two times a day (BID) | ORAL | 0 refills | Status: AC

## 2022-01-05 MED ORDER — ONDANSETRON 4 MG PO TBDP: 4.0000 mg | ORAL_TABLET | Freq: Three times a day (TID) | ORAL | 0 refills | Status: DC | PRN

## 2022-01-05 NOTE — Progress Notes (Signed)
Virtual Visit Consent   Julia Blanchard, you are scheduled for a virtual visit with a [facility] provider today. Just as with appointments in the office, your consent must be obtained to participate. Your consent will be active for this visit and any virtual visit you may have with one of our providers in the next 365 days. If you have a MyChart account, a copy of this consent can be sent to you electronically.  As this is a virtual visit, video technology does not allow for your provider to perform a traditional examination. This may limit your provider's ability to fully assess your condition. If your provider identifies any concerns that need to be evaluated in person or the need to arrange testing (such as labs, EKG, etc.), we will make arrangements to do so. Although advances in technology are sophisticated, we cannot ensure that it will always work on either your end or our end. If the connection with a video visit is poor, the visit may have to be switched to a telephone visit. With either a video or telephone visit, we are not always able to ensure that we have a secure connection.  By engaging in this virtual visit, you consent to the provision of healthcare and authorize for your insurance to be billed (if applicable) for the services provided during this visit. Depending on your insurance coverage, you may receive a charge related to this service.  I need to obtain your verbal consent now. Are you willing to proceed with your visit today? Julia Blanchard has provided verbal consent on 01/05/2022 for a virtual visit (video or telephone). Mar Daring, PA-C  Date: 01/05/2022 2:41 PM  Virtual Visit via Video Note   I, Mar Daring, connected with  Julia Blanchard  ([id number], 1991-02-07) on 01/05/22 at  2:30 PM EDT by a video-enabled telemedicine application and verified that I am speaking with the correct person using two identifiers.  Location: Patient: Virtual Visit  Location Patient: Home Provider: Virtual Visit Location Provider: Home Office   I discussed the limitations of evaluation and management by telemedicine and the availability of in person appointments. The patient expressed understanding and agreed to proceed.    History of Present Illness: Julia Blanchard is a 31 y.o. who identifies as a female who was assigned female at birth, and is being seen today for Covid 32.  HPI: URI  This is a new problem. The current episode started in the past 7 days (Tested positive for Covid 19 on at home test; Symptoms started Wednesday). The problem has been gradually worsening. Maximum temperature: subjective fevers. Associated symptoms include chest pain (heaviness), congestion, coughing (mild), headaches, joint pain, nausea, rhinorrhea and a sore throat. Pertinent negatives include no diarrhea, ear pain, plugged ear sensation, sinus pain or vomiting. Associated symptoms comments: Myalgias, fatigue, off balance, chills and sweats. She has tried acetaminophen for the symptoms. The treatment provided no relief.      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 (Take  nirmatrelvir 150 mg two tablets twice daily for 5 days and ritonavir 100 mg one tablet twice daily for 5 days) Patient GFR is 105, Disp: 30 tablet, Rfl: 0   ondansetron (ZOFRAN-ODT) 4 MG disintegrating tablet, Take 1 tablet (4 mg total) by mouth every 8 (eight) hours as needed for nausea or vomiting., Disp: 20 tablet, Rfl: 0   albuterol (VENTOLIN HFA) 108 (90 Base) MCG/ACT inhaler, Inhale 1-2 puffs into the lungs every 4 (four) hours as needed for wheezing or shortness of breath., Disp: 18 g, Rfl: 0   doxycycline (MONODOX) 100 MG capsule, Take 1 capsule once daily with dinner, Disp: 90 capsule, Rfl: 1   spironolactone (ALDACTONE) 25 MG tablet, Take 2 tablets once daily, Disp: 60 tablet, Rfl: 3   venlafaxine (EFFEXOR) 37.5 MG tablet, Take 1 tablet (37.5 mg total) by mouth daily., Disp: 30 tablet, Rfl: 1  Observations/Objective: Patient is well-developed, well-nourished in no acute distress.  Resting comfortably at home.  Head is normocephalic, atraumatic.  No labored breathing.  Speech is clear and coherent with logical content.  Patient is alert and oriented at baseline.    Assessment and Plan: 1. COVID-19 - nirmatrelvir/ritonavir EUA (PAXLOVID) 20 x 150 MG & 10 x '100MG'$  TABS; Take 3 tablets by mouth 2 (two) times daily for 5 days. (Take nirmatrelvir 150 mg two tablets twice daily for 5 days and ritonavir 100 mg one tablet twice daily for 5 days) Patient GFR is 105  Dispense: 30 tablet; Refill: 0 - ondansetron (ZOFRAN-ODT) 4 MG disintegrating tablet; Take 1 tablet (4 mg total) by mouth every 8 (eight) hours as needed for nausea or vomiting.  Dispense: 20 tablet; Refill: 0 - albuterol (VENTOLIN HFA) 108 (90 Base) MCG/ACT inhaler; Inhale 1-2 puffs into the lungs every 4 (four) hours as needed for wheezing or shortness of breath.  Dispense: 18 g; Refill: 0  - Continue OTC symptomatic management of choice - Will send OTC vitamins and supplement information through AVS - Paxlovid, Zofran, and  albuterol prescribed - Patient enrolled in MyChart symptom monitoring - Push fluids - Rest as needed - Discussed return precautions and when to seek in-person evaluation, sent via AVS as well   Follow Up Instructions: I discussed the assessment and treatment plan with the patient. The patient was provided an opportunity to ask questions and all were answered. The patient agreed with the plan and demonstrated an understanding of the instructions.  A copy of instructions were sent to the patient via MyChart unless otherwise noted below.     The patient was advised to call back or seek an in-person evaluation if the symptoms worsen or if the condition fails to improve as anticipated.  Time:  I spent 10 minutes with the patient via telehealth technology discussing the above problems/concerns.    Mar Daring, PA-C

## 2022-01-05 NOTE — Patient Instructions (Signed)
Danean A Venida Jarvis, thank you for joining Mar Daring, PA-C for today's virtual visit.  While this provider is not your primary care provider (PCP), if your PCP is located in our provider database this encounter information will be shared with them immediately following your visit.  Consent: (Patient) Julia Blanchard provided verbal consent for this virtual visit at the beginning of the encounter.  Current Medications:  Current Outpatient Medications:    nirmatrelvir/ritonavir EUA (PAXLOVID) 20 x 150 MG & 10 x '100MG'$  TABS, Take 3 tablets by mouth 2 (two) times daily for 5 days. (Take nirmatrelvir 150 mg two tablets twice daily for 5 days and ritonavir 100 mg one tablet twice daily for 5 days) Patient GFR is 105, Disp: 30 tablet, Rfl: 0   ondansetron (ZOFRAN-ODT) 4 MG disintegrating tablet, Take 1 tablet (4 mg total) by mouth every 8 (eight) hours as needed for nausea or vomiting., Disp: 20 tablet, Rfl: 0   albuterol (VENTOLIN HFA) 108 (90 Base) MCG/ACT inhaler, Inhale 1-2 puffs into the lungs every 4 (four) hours as needed for wheezing or shortness of breath., Disp: 18 g, Rfl: 0   doxycycline (MONODOX) 100 MG capsule, Take 1 capsule once daily with dinner, Disp: 90 capsule, Rfl: 1   spironolactone (ALDACTONE) 25 MG tablet, Take 2 tablets once daily, Disp: 60 tablet, Rfl: 3   venlafaxine (EFFEXOR) 37.5 MG tablet, Take 1 tablet (37.5 mg total) by mouth daily., Disp: 30 tablet, Rfl: 1   Medications ordered in this encounter:  Meds ordered this encounter  Medications   nirmatrelvir/ritonavir EUA (PAXLOVID) 20 x 150 MG & 10 x '100MG'$  TABS    Sig: Take 3 tablets by mouth 2 (two) times daily for 5 days. (Take nirmatrelvir 150 mg two tablets twice daily for 5 days and ritonavir 100 mg one tablet twice daily for 5 days) Patient GFR is 105    Dispense:  30 tablet    Refill:  0    Order Specific Question:   Supervising Provider    Answer:   Chase Picket [[id number]]   ondansetron (ZOFRAN-ODT)  4 MG disintegrating tablet    Sig: Take 1 tablet (4 mg total) by mouth every 8 (eight) hours as needed for nausea or vomiting.    Dispense:  20 tablet    Refill:  0    Order Specific Question:   Supervising Provider    Answer:   Bari Mantis   albuterol (VENTOLIN HFA) 108 (90 Base) MCG/ACT inhaler    Sig: Inhale 1-2 puffs into the lungs every 4 (four) hours as needed for wheezing or shortness of breath.    Dispense:  18 g    Refill:  0    Order Specific Question:   Supervising Provider    Answer:   Chase Picket [id number]     *If you need refills on other medications prior to your next appointment, please contact your pharmacy*  Follow-Up: Call back or seek an in-person evaluation if the symptoms worsen or if the condition fails to improve as anticipated.  [facility] [phone]  Other Instructions COVID-19 COVID-19, or coronavirus disease 2019, is an infection that is caused by a new (novel) coronavirus called SARS-CoV-2. COVID-19 can cause many symptoms. In some people, the virus may not cause any symptoms. In others, it may cause mild or severe symptoms. Some people with severe infection develop severe disease. What are the causes? This illness is caused by a virus. The virus  may be in the air as tiny specks of fluid (aerosols) or droplets, or it may be on surfaces. You may catch the virus by: Breathing in droplets from an infected person. Droplets can be spread by a person breathing, speaking, singing, coughing, or sneezing. Touching something, like a table or a doorknob, that has virus on it (is contaminated) and then touching your mouth, nose, or eyes. What increases the risk? Risk for infection: You are more likely to get infected with the COVID-19 virus if: You are within 6 ft (1.8 m) of a person with COVID-19 for 15 minutes or longer. You are providing care for a person who is infected with COVID-19. You are in close personal contact with  other people. Close personal contact includes hugging, kissing, or sharing eating or drinking utensils. Risk for serious illness caused by COVID-19: You are more likely to get seriously ill from the COVID-19 virus if: You have cancer. You have a long-term (chronic) disease, such as: Chronic lung disease. This includes pulmonary embolism, chronic obstructive pulmonary disease, and cystic fibrosis. Long-term disease that lowers your body's ability to fight infection (immunocompromise). Serious cardiac conditions, such as heart failure, coronary artery disease, or cardiomyopathy. Diabetes. Chronic kidney disease. Liver diseases. These include cirrhosis, nonalcoholic fatty liver disease, alcoholic liver disease, or autoimmune hepatitis. You have obesity. You are pregnant or were recently pregnant. You have sickle cell disease. What are the signs or symptoms? Symptoms of this condition can range from mild to severe. Symptoms may appear any time from 2 to 14 days after being exposed to the virus. They include: Fever or chills. Shortness of breath or trouble breathing. Feeling tired or very tired. Headaches, body aches, or muscle aches. Runny or stuffy nose, sneezing, coughing, or sore throat. New loss of taste or smell. This is rare. Some people may also have stomach problems, such as nausea, vomiting, or diarrhea. Other people may not have any symptoms of COVID-19. How is this diagnosed? This condition may be diagnosed by testing samples to check for the COVID-19 virus. The most common tests are the PCR test and the antigen test. Tests may be done in the lab or at home. They include: Using a swab to take a sample of fluid from the back of your nose and throat (nasopharyngeal fluid), from your nose, or from your throat. Testing a sample of saliva from your mouth. Testing a sample of coughed-up mucus from your lungs (sputum). How is this treated? Treatment for COVID-19 infection depends on the  severity of the condition. Mild symptoms can be managed at home with rest, fluids, and over-the-counter medicines. Serious symptoms may be treated in a hospital intensive care unit (ICU). Treatment in the ICU may include: Supplemental oxygen. Extra oxygen is given through a tube in the nose, a face mask, or a hood. Medicines. These may include: Antivirals, such as monoclonal antibodies. These help your body fight off certain viruses that can cause disease. Anti-inflammatories, such as corticosteroids. These reduce inflammation and suppress the immune system. Antithrombotics. These prevent or treat blood clots, if they develop. Convalescent plasma. This helps boost your immune system, if you have an underlying immunosuppressive condition or are getting immunosuppressive treatments. Prone positioning. This means you will lie on your stomach. This helps oxygen to get into your lungs. Infection control measures. If you are at risk for more serious illness caused by COVID-19, your health care provider may prescribe two long-acting monoclonal antibodies, given together every 6 months. How is this prevented?  To protect yourself: Use preventive medicine (pre-exposure prophylaxis). You may get pre-exposure prophylaxis if you have moderate or severe immunocompromise. Get vaccinated. Anyone 17 months old or older who meets guidelines can get a COVID-19 vaccine or vaccine series. This includes people who are pregnant or making breast milk (lactating). Get an added dose of COVID-19 vaccine after your first vaccine or vaccine series if you have moderate to severe immunocompromise. This applies if you have had a solid organ transplant or have been diagnosed with an immunocompromising condition. You should get the added dose 4 weeks after you got the first COVID-19 vaccine or vaccine series. If you get an mRNA vaccine, you will need a 3-dose primary series. If you get the J&J/Janssen vaccine, you will need a  2-dose primary series, with the second dose being an mRNA vaccine. Talk to your health care provider about getting experimental monoclonal antibodies. This treatment is approved under emergency use authorization to prevent severe illness before or after being exposed to the COVID-19 virus. You may be given monoclonal antibodies if: You have moderate or severe immunocompromise. This includes treatments that lower your immune response. People with immunocompromise may not develop protection against COVID-19 when they are vaccinated. You cannot be vaccinated. You may not get a vaccine if you have a severe allergic reaction to the vaccine or its components. You are not fully vaccinated. You are in a facility where COVID-19 is present and: Are in close contact with a person who is infected with the COVID-19 virus. Are at high risk of being exposed to the COVID-19 virus. You are at risk of illness from new variants of the COVID-19 virus. To protect others: If you have symptoms of COVID-19, take steps to prevent the virus from spreading to others. Stay home. Leave your house only to get medical care. Do not use public transit, if possible. Do not travel while you are sick. Wash your hands often with soap and water for at least 20 seconds. If soap and water are not available, use alcohol-based hand sanitizer. Make sure that all people in your household wash their hands well and often. Cough or sneeze into a tissue or your sleeve or elbow. Do not cough or sneeze into your hand or into the air. Where to find more information Centers for Disease Control and Prevention: [employer] World Health Organization: https://www.castaneda.info/ Get help right away if: You have trouble breathing. You have pain or pressure in your chest. You are confused. You have bluish lips and fingernails. You have trouble waking from sleep. You have symptoms that get worse. These symptoms may be an  emergency. Get help right away. Call 911. Do not wait to see if the symptoms will go away. Do not drive yourself to the hospital. Summary COVID-19 is an infection that is caused by a new coronavirus. Sometimes, there are no symptoms. Other times, symptoms range from mild to severe. Some people with a severe COVID-19 infection develop severe disease. The virus that causes COVID-19 can spread from person to person through droplets or aerosols from breathing, speaking, singing, coughing, or sneezing. Mild symptoms of COVID-19 can be managed at home with rest, fluids, and over-the-counter medicines. This information is not intended to replace advice given to you by your health care provider. Make sure you discuss any questions you have with your health care provider. Document Revised: 03/16/2021 Document Reviewed: 03/16/2021 Elsevier Patient Education  [place].    If you have been instructed to have an in-person evaluation  today at a local Urgent Care facility, please use the link below. It will take you to a list of all of our available [facility], including address, phone number and hours of operation. Please do not delay care.  [facility]  If you or a family member do not have a primary care provider, use the link below to schedule a visit and establish care. When you choose a [facility] primary care physician or advanced practice provider, you gain a long-term partner in health. Find a Primary Care Provider  Learn more about [facility] in-office and virtual care options: [facility]

## 2022-01-09 ENCOUNTER — Encounter (HOSPITAL_COMMUNITY): Payer: Self-pay | Admitting: Emergency Medicine

## 2022-01-09 ENCOUNTER — Other Ambulatory Visit: Payer: Self-pay

## 2022-01-09 ENCOUNTER — Emergency Department (HOSPITAL_COMMUNITY)
Admission: EM | Admit: 2022-01-09 | Discharge: 2022-01-09 | Disposition: A | Discharge status: Home / Self Care | Payer: Medicaid Other | Attending: Emergency Medicine | Admitting: Emergency Medicine

## 2022-01-09 DIAGNOSIS — M546 Pain in thoracic spine: Secondary | ICD-10-CM | POA: Diagnosis present

## 2022-01-09 DIAGNOSIS — J45909 Unspecified asthma, uncomplicated: Secondary | ICD-10-CM | POA: Insufficient documentation

## 2022-01-09 DIAGNOSIS — M5459 Other low back pain: Secondary | ICD-10-CM | POA: Diagnosis not present

## 2022-01-09 DIAGNOSIS — Z87891 Personal history of nicotine dependence: Secondary | ICD-10-CM | POA: Insufficient documentation

## 2022-01-09 DIAGNOSIS — M545 Low back pain, unspecified: Secondary | ICD-10-CM

## 2022-01-09 MED ORDER — KETOROLAC TROMETHAMINE 15 MG/ML IJ SOLN
30.0000 mg | Freq: Once | INTRAMUSCULAR | Status: AC
Start: 2022-01-09 — End: 2022-01-09
  Administered 2022-01-09: 30 mg via INTRAMUSCULAR

## 2022-01-09 MED ORDER — LIDOCAINE 5 % EX PTCH: 1.0000 | MEDICATED_PATCH | CUTANEOUS | 0 refills | Status: DC

## 2022-01-09 MED ORDER — METHOCARBAMOL 500 MG PO TABS: 500.0000 mg | ORAL_TABLET | Freq: Three times a day (TID) | ORAL | 0 refills | Status: DC | PRN

## 2022-01-09 MED ORDER — NAPROXEN 500 MG PO TABS: 500.0000 mg | ORAL_TABLET | Freq: Two times a day (BID) | ORAL | 0 refills | Status: DC

## 2022-01-09 NOTE — Discharge Instructions (Signed)
You were evaluated in the Emergency Department and after careful evaluation, we did not find any emergent condition requiring admission or further testing in the hospital.  Your exam/testing today is overall reassuring.  Symptoms likely due to a strain or spasm of the back muscles.  Recommend Naprosyn twice daily for pain.  Can also use the numbing patches daily.  Use the Robaxin muscle relaxer for more significant pain but use caution as it can cause drowsiness.  Please return to the Emergency Department if you experience any worsening of your condition.   Thank you for allowing [place] to be a part of your care.

## 2022-01-09 NOTE — ED Provider Notes (Signed)
[facility] Emergency Department Provider Note MRN:  [id number]  Arrival date & time: 01/09/22     Chief Complaint   Back Pain   History of Present Illness   Julia Blanchard is a 31 y.o. year-old female with a history of asthma presenting to the ED with chief complaint of back pain.  Patient felt a twinge of mid lower back pain well cooking in the kitchen earlier this evening.  Became worse and worse, trouble sleeping or moving due to pain.  No numbness or weakness to the arms or legs, no bowel or bladder dysfunction, no recent trauma.  No fever.  Review of Systems  A thorough review of systems was obtained and all systems are negative except as noted in the HPI and PMH.   Patient's Health History    Past Medical History:  Diagnosis Date   Asthma    Pt states "acts up when sick"   Gestational diabetes    Metformin    Past Surgical History:  Procedure Laterality Date   BREAST LUMPECTOMY Right 2014   right breast-benign   removal rt palm lesion     US BREAST BIOPSY ([place] HX) Right 04/19/2021   ribbon 3:00 path pending    Family History  Problem Relation Age of Onset   Cancer Maternal Grandfather        pancreatic   Cancer Paternal Grandmother        brain   Stroke Paternal Grandfather    Breast cancer Cousin     Social History   Socioeconomic History   Marital status: Married    Spouse name: Not on file   Number of children: 1   Years of education: Not on file   Highest education level: Not on file  Occupational History   Not on file  Tobacco Use   Smoking status: Former   Smokeless tobacco: Never  Vaping Use   Vaping Use: Never used  Substance and Sexual Activity   Alcohol use: Never   Drug use: Never   Sexual activity: Yes    Birth control/protection: Implant  Other Topics Concern   Not on file  Social History Narrative   Married for 12 months.Lives with husband and step-daughter.Originally from [place].   Social  Determinants of Health   Financial Resource Strain: Not on file  Food Insecurity: Not on file  Transportation Needs: Not on file  Physical Activity: Not on file  Stress: Not on file  Social Connections: Not on file  Intimate Partner Violence: Not on file     Physical Exam   Vitals:   01/09/22 0257  BP: 131/84  Pulse: 70  Temp: 97.6 F (36.4 C)  SpO2: 98%    CONSTITUTIONAL: Well-appearing, NAD NEURO/PSYCH:  Alert and oriented x 3, no focal deficits EYES:  eyes equal and reactive ENT/NECK:  no LAD, no JVD CARDIO: Regular rate, well-perfused, normal S1 and S2 PULM:  CTAB no wheezing or rhonchi GI/GU:  non-distended, non-tender MSK/SPINE:  No gross deformities, no edema SKIN:  no rash, atraumatic   *Additional and/or pertinent findings included in MDM below  Diagnostic and Interventional Summary    EKG Interpretation  Date/Time:    Ventricular Rate:    PR Interval:    QRS Duration:   QT Interval:    QTC Calculation:   R Axis:     Text Interpretation:         Labs Reviewed - No data to display  No orders to display  Medications  ketorolac (TORADOL) 15 MG/ML injection 30 mg (30 mg Intramuscular Given 01/09/22 0349)     Procedures  /  Critical Care Procedures  ED Course and Medical Decision Making  Initial Impression and Ddx Suspect muscular strain or spasm versus herniated disc, providing symptomatic management and will reassess.  No signs or symptoms to suggest myelopathy.  Normal and symmetric strength and sensation to the lower extremities.  Past medical/surgical history that increases complexity of ED encounter: None  Interpretation of Diagnostics Laboratory and/or imaging options to aid in the diagnosis/care of the patient were considered.  After careful history and physical examination, it was determined that there was no indication for diagnostics at this time. Patient Reassessment and Ultimate Disposition/Management     Patient feeling a bit  better after Toradol, continues to have a reassuring neurological exam, appropriate for discharge.  Patient management required discussion with the following services or consulting groups:  None  Complexity of Problems Addressed Acute illness or injury that poses threat of life of bodily function  Additional Data Reviewed and Analyzed Further history obtained from: None  Additional Factors Impacting ED Encounter Risk Prescriptions  Barth Kirks. Sedonia Small, Ko Olina [email or web address]  Final Clinical Impressions(s) / ED Diagnoses     ICD-10-CM   1. Acute midline low back pain without sciatica  M54.50       ED Discharge Orders          Ordered    lidocaine (LIDODERM) 5 %  Every 24 hours        01/09/22 0525    methocarbamol (ROBAXIN) 500 MG tablet  Every 8 hours PRN        01/09/22 0525    naproxen (NAPROSYN) 500 MG tablet  2 times daily        01/09/22 0525             Discharge Instructions Discussed with and Provided to Patient:     Discharge Instructions      You were evaluated in the Emergency Department and after careful evaluation, we did not find any emergent condition requiring admission or further testing in the hospital.  Your exam/testing today is overall reassuring.  Symptoms likely due to a strain or spasm of the back muscles.  Recommend Naprosyn twice daily for pain.  Can also use the numbing patches daily.  Use the Robaxin muscle relaxer for more significant pain but use caution as it can cause drowsiness.  Please return to the Emergency Department if you experience any worsening of your condition.   Thank you for allowing [place] to be a part of your care.       Maudie Flakes, MD 01/09/22 [phone]

## 2022-01-09 NOTE — ED Triage Notes (Signed)
Pt with c/o lower back pain that started tonight after she finished cooking dinner. Denies any known injury.

## 2022-01-29 DIAGNOSIS — Q681 Congenital deformity of finger(s) and hand: Secondary | ICD-10-CM | POA: Diagnosis not present

## 2022-01-29 DIAGNOSIS — Q740 Other congenital malformations of upper limb(s), including shoulder girdle: Secondary | ICD-10-CM | POA: Diagnosis not present

## 2022-01-30 ENCOUNTER — Ambulatory Visit: Payer: Medicaid Other | Admitting: Physician Assistant

## 2022-02-13 ENCOUNTER — Encounter: Payer: Self-pay | Admitting: Physician Assistant

## 2022-02-13 ENCOUNTER — Ambulatory Visit (INDEPENDENT_AMBULATORY_CARE_PROVIDER_SITE_OTHER): Payer: Medicaid Other | Admitting: Physician Assistant

## 2022-02-13 DIAGNOSIS — F4323 Adjustment disorder with mixed anxiety and depressed mood: Secondary | ICD-10-CM

## 2022-02-13 MED ORDER — VENLAFAXINE HCL 50 MG PO TABS: 50.0000 mg | ORAL_TABLET | Freq: Every day | ORAL | 1 refills | Status: DC

## 2022-02-13 MED ORDER — HYDROXYZINE PAMOATE 25 MG PO CAPS: 25.0000 mg | ORAL_CAPSULE | Freq: Three times a day (TID) | ORAL | 0 refills | Status: AC | PRN

## 2022-02-13 NOTE — Progress Notes (Unsigned)
Established Patient Office Visit  Name: Julia Blanchard   MRN: [id number]    DOB: 1990-07-21   Date:02/15/2022  Today's Provider: Talitha Givens, MHS, PA-C Introduced myself to the patient as a PA-C and provided education on APPs in clinical practice.         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  ANXIETY/STRESS Duration:exacerbated- does not appear to be well controlled right now  Anxious mood: yes  Excessive worrying: yes Irritability: no  Sweating: no Nausea: no Palpitations:yes Hyperventilation: no Panic attacks: yes Agoraphobia: no  Obscessions/compulsions: no Depressed mood: yes    March 06, 2022    3:36 PM 12/18/2021    2:40 PM 08/21/2021   10:54 AM 04/25/2021    9:42 AM 04/25/2021    9:31 AM  Depression screen PHQ 2/9  Decreased Interest '1 1 2 1 2  '$ Down, Depressed, Hopeless '1 1 1 1 1  '$ PHQ - 2 Score '2 2 3 2 3  '$ Altered sleeping '3 3 3 2 3  '$ Tired, decreased energy '2 2 2 1 3  '$ Change in appetite '3 3 3 1 2  '$ Feeling bad or failure about yourself  '2 1 1 1 1  '$ Trouble concentrating '2 1 1 '$ 0 1  Moving slowly or fidgety/restless 1 0 0 1 0  Suicidal thoughts 0 0 0 1 0  PHQ-9 Score '15 12 13 9 13  '$ Difficult doing work/chores Very difficult Somewhat difficult Somewhat difficult Not difficult at all Somewhat difficult   Anhedonia: yes Weight changes: no Insomnia: yes hard to fall asleep  and stay asleep Hypersomnia: no Fatigue/loss of energy: yes Feelings of worthlessness: no- feels like she is contributing to family now that she is working  Feelings of guilt: no Impaired concentration/indecisiveness: yes Suicidal ideations: no  Crying  spells: no Recent Stressors/Life Changes: yes   Relationship problems: no   Family stress: no     Financial stress:  improving - working outside of the home is helping      Job stress: yes reports this has gotten more stressful    Recent death/loss: no      06-Mar-2022    3:36 PM 12/18/2021    2:40 PM 08/21/2021   10:54 AM 04/25/2021    9:42 AM  GAD 7 : Generalized Anxiety Score  Nervous, Anxious, on Edge '3 3 2 1  '$ Control/stop worrying '2 3 2 1  '$ Worry too much - different things '3 2 2 '$ 0  Trouble relaxing '3 3 2 '$ 0  Restless '3 2 1 1  '$ Easily annoyed or irritable '2 3 3 1  '$ Afraid - awful might happen '1 2 3 2  '$ Total GAD 7 Score '17 18 15 6  '$ Anxiety Difficulty Extremely difficult Extremely difficult Somewhat difficult Not difficult at all      Patient Active Problem List   Diagnosis Date Noted   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   Acute adjustment disorder with mixed anxiety and depressed mood 12/18/2021   Hidradenitis suppurativa 12/18/2021   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   Class 3 severe obesity without serious comorbidity with body mass index (BMI)  of 40.0 to 44.9 in adult, unspecified obesity type ([place]) 08/21/2021   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   Nexplanon in place 10/20/2019   Postpartum depression & anxiety 10/20/2019   History of gestational hypertension 09/21/2019   History of gestational diabetes 06/24/2019    Past Surgical History:  Procedure Laterality Date   BREAST LUMPECTOMY Right 2014   right breast-benign   removal rt palm lesion     US BREAST BIOPSY ([place] HX) Right 04/19/2021   ribbon 3:00 path pending    Family History  Problem Relation Age of Onset   Cancer Maternal Grandfather        pancreatic   Cancer Paternal Grandmother        brain   Stroke Paternal Grandfather    Breast cancer Cousin     Social History   Tobacco Use   Smoking status: Former   Smokeless tobacco: Never  Substance Use Topics   Alcohol  use: Never     Current Outpatient Medications:    albuterol (VENTOLIN HFA) 108 (90 Base) MCG/ACT inhaler, Inhale 1-2 puffs into the lungs every 4 (four) hours as needed for wheezing or shortness of breath., Disp: 18 g, Rfl: 0   doxycycline (MONODOX) 100 MG capsule, Take 1 capsule once daily with dinner, Disp: 90 capsule, Rfl: 1   hydrOXYzine (VISTARIL) 25 MG capsule, Take 1 capsule (25 mg total) by mouth every 8 (eight) hours as needed., Disp: 60 capsule, Rfl: 0   naproxen (NAPROSYN) 500 MG tablet, Take 1 tablet (500 mg total) by mouth 2 (two) times daily., Disp: 30 tablet, Rfl: 0   venlafaxine (EFFEXOR) 50 MG tablet, Take 1 tablet (50 mg total) by mouth daily., Disp: 30 tablet, Rfl: 1  No Known Allergies  I personally reviewed active problem list, medication list, allergies, notes from last encounter with the patient/caregiver today.   Review of Systems  Psychiatric/Behavioral:  Positive for depression. Negative for suicidal ideas. The patient is nervous/anxious and has insomnia.       Objective  Vitals:   02/13/22 1529  BP: 123/77  Pulse: 85  Temp: 98.3 F (36.8 C)  TempSrc: Oral  SpO2: 98%  Weight: 240 lb 11.2 oz (109.2 kg)  Height: 5' (1.524 m)    Body mass index is 47.01 kg/m.  Physical Exam Vitals reviewed.  Constitutional:      General: She is awake.     Appearance: Normal appearance. She is well-developed and well-groomed.  HENT:     Head: Normocephalic and atraumatic.  Pulmonary:     Effort: Pulmonary effort is normal.  Neurological:     Mental Status: She is alert.  Psychiatric:        Attention and Perception: Attention and perception normal.        Mood and Affect: Mood and affect normal.        Speech: Speech normal.        Behavior: Behavior normal. Behavior is cooperative.        Thought Content: Thought content normal.        Cognition and Memory: Cognition and memory normal.        Judgment: Judgment normal.      Recent Results (from the  past 2160 hour(s))  GLA Sequencing     Status: None   Collection Time: 12/21/21  3:06 PM  Result Value Ref Range   GLA Sequencing Comment     Comment: The test has been completed and the results  have been faxed to you.  Alpha galactosidase     Status: None   Collection Time: 12/21/21  3:06 PM  Result Value Ref Range   Alpha-Galactosidase activity 53.3 >35.5 nmol/hr/mg prt   Interpretation Comment     Comment: Unaffected The above enzyme activity is consistent with this patient being unaffected with alpha-galactosidase A deficiency. 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 Note: Due to random X-chromosome inactivation, these results may not reflect Fabry disease carrier status in females.    Director Review Comment     Comment: Loree Fee, PhD Director, Biochemical Genetics To discuss these results or other testing for inborn errors of metabolism, please contact our Biochemical Geneticists at [phone] [employer]([id number]), [place], [place], [place].    Methodology Comment     Comment: Alpha-galactosidase activity was measured against the artificial substrate 4-methylumbelliferyl-alpha-D-galactopyranoside by the method of Desnick RJ, et al. ([place], Desnick SJ, Raman [place], Delice Lesch, [employer], [phone])    Disclaimer: Comment     Comment: This test was developed and its performance characteristics determined by [facility]. It has not been cleared or approved by the Food and Drug Administration.      PHQ2/9:    02/13/2022    3:36 PM 12/18/2021    2:40 PM 08/21/2021   10:54 AM 04/25/2021    9:42 AM 04/25/2021    9:31 AM  Depression screen PHQ 2/9  Decreased Interest '1 1 2 1 2  '$ Down, Depressed, Hopeless '1 1 1 1 1  '$ PHQ - 2 Score '2 2 3 2 3  '$ Altered sleeping '3 3 3 2 3  '$ Tired, decreased energy '2 2 2 1 3  '$ Change in appetite '3 3 3 1 2  '$ Feeling bad or failure about yourself  '2 1 1 1 1   '$ Trouble concentrating '2 1 1 '$ 0 1  Moving slowly or fidgety/restless 1 0 0 1 0  Suicidal thoughts 0 0 0 1 0  PHQ-9 Score '15 12 13 9 13  '$ Difficult doing work/chores Very difficult Somewhat difficult Somewhat difficult Not difficult at all Somewhat difficult      Fall Risk:    02/13/2022    3:36 PM 12/18/2021    2:40 PM 08/21/2021   10:06 AM 04/25/2021    9:42 AM 04/25/2021    9:31 AM  [place] in the past year? 0 0 0 0 0  Number falls in past yr: 0 0 0 0 0  Injury with Fall? 0 0 0 0 0  Risk for fall due to : No Fall Risks No Fall Risks No Fall Risks No Fall Risks No Fall Risks  Follow up Falls evaluation completed Falls evaluation completed Falls evaluation completed Falls evaluation completed Falls evaluation completed      Functional Status Survey:      Assessment & Plan  Problem List Items Addressed This Visit       Other   Acute adjustment disorder with mixed anxiety and depressed mood    Acute on chronic, ongoing concern She has been taking Venlafaxine 37.5 mg PO QD with mixed improvement in symptoms  Will increase to 50 mg PO QD today with patient approval  Will also add Hydroxyzine 25 mg PO TID PRN for anxiety attacks as she reports ongoing concerns for this  Discussed addition of therapy services to assist with management- she is hesitant about engaging with this - referral placed today  as she may have a bit of a wait for availability and can decline if she does not wish to participate Recommend follow up in about 8 weeks to discuss response to dose change and Hydroxyzine addition       Relevant Medications   venlafaxine (EFFEXOR) 50 MG tablet   hydrOXYzine (VISTARIL) 25 MG capsule   Other Relevant Orders   Ambulatory referral to Psychology     Return in about 2 months (around 04/15/2022) for Depression, anxiety , med change follow up .   I, Hawke Villalpando E Rosann Gorum, PA-C, have reviewed all documentation for this visit. The documentation on 02/15/22 for the exam,  diagnosis, procedures, and orders are all accurate and complete.   Talitha Givens, MHS, PA-C [facility]

## 2022-02-13 NOTE — Patient Instructions (Addendum)
  I would like to increase your Effexor dose to 50 mg per day  I think this will help provide more coverage for your anxiety and help with stability   I am sending in a script for a medication called Hydroxyzine 25 mg  - take this as you need it up to three times per day   This can help with the anxiety attacks and severe anxiety that you can't calm yourself down when it occurs.   I have placed a referral to Psychology services for you. This can help set you up with therapy or counseling services if you are interested in participating with it. Therapy can help with managing your symptoms especially when paired with medications.

## 2022-02-15 NOTE — Assessment & Plan Note (Addendum)
Acute on chronic, ongoing concern She has been taking Venlafaxine 37.5 mg PO QD with mixed improvement in symptoms  Will increase to 50 mg PO QD today with patient approval  Will also add Hydroxyzine 25 mg PO TID PRN for anxiety attacks as she reports ongoing concerns for this  Discussed addition of therapy services to assist with management- she is hesitant about engaging with this - referral placed today as she may have a bit of a wait for availability and can decline if she does not wish to participate Recommend follow up in about 8 weeks to discuss response to dose change and Hydroxyzine addition

## 2022-02-16 ENCOUNTER — Telehealth: Payer: Self-pay

## 2022-02-16 ENCOUNTER — Other Ambulatory Visit: Payer: Self-pay | Admitting: Physician Assistant

## 2022-02-16 DIAGNOSIS — F4323 Adjustment disorder with mixed anxiety and depressed mood: Secondary | ICD-10-CM

## 2022-02-16 NOTE — Telephone Encounter (Signed)
Unable to refill per protocol, Rx request is too soon, last refill 02/13/22 for 30 and 1.E-Prescribing Status: Receipt confirmed by pharmacy (02/13/2022  4:11 PM EST).Will refuse.  Requested Prescriptions  Pending Prescriptions Disp Refills   venlafaxine (EFFEXOR) 37.5 MG tablet [Pharmacy Med Name: VENLAFAXINE 37.'5MG'$  TABLETS] 30 tablet 1    Sig: TAKE 1 TABLET(37.5 MG) BY MOUTH DAILY     Psychiatry: Antidepressants - SNRI - desvenlafaxine & venlafaxine Failed - 02/16/2022  6:24 AM      Failed - Lipid Panel in normal range within the last 12 months    Cholesterol, Total  Date Value Ref Range Status  04/17/2021 173 100 - 199 mg/dL Final   LDL Cholesterol (Calc)  Date Value Ref Range Status  09/22/2020 105 (H) mg/dL (calc) Final    Comment:    Reference range: <100 . Desirable range <100 mg/dL for primary prevention;   <70 mg/dL for patients with CHD or diabetic patients  with > or = 2 CHD risk factors. Marland Kitchen LDL-C is now calculated using the Martin-Hopkins  calculation, which is a validated novel method providing  better accuracy than the Friedewald equation in the  estimation of LDL-C.  Cresenciano Genre et al. Annamaria Helling. 6384;665(99): 2061-2068  (http://education.[facility].com/faq/FAQ164)    LDL Chol Calc (NIH)  Date Value Ref Range Status  04/17/2021 111 (H) 0 - 99 mg/dL Final   HDL  Date Value Ref Range Status  04/17/2021 40 >39 mg/dL Final   Triglycerides  Date Value Ref Range Status  04/17/2021 124 0 - 149 mg/dL Final         Passed - Cr in normal range and within 360 days    Creat  Date Value Ref Range Status  09/22/2020 0.79 0.50 - 1.10 mg/dL Final   Creatinine, Ser  Date Value Ref Range Status  04/17/2021 0.78 0.57 - 1.00 mg/dL Final   Creatinine, Urine  Date Value Ref Range Status  09/20/2019 121.10 mg/dL Final         Passed - Completed PHQ-2 or PHQ-9 in the last 360 days      Passed - Last BP in normal range    BP Readings from Last 1 Encounters:  02/13/22  123/77         Passed - Valid encounter within last 6 months    Recent Outpatient Visits           3 days ago Acute adjustment disorder with mixed anxiety and depressed mood   [facility] Mecum, Erin E, PA-C   2 months ago Acute adjustment disorder with mixed anxiety and depressed mood   [facility] Mecum, Dani Gobble, PA-C   5 months ago Encounter for annual physical exam   [employer], Dani Gobble, PA-C   9 months ago Fibroadenoma of breast, unspecified laterality   [facility] Vigg, Avanti, MD   11 months ago Multiple hemangiomas   [facility] Vigg, Avanti, MD       Future Appointments             In 5 days Ralene Bathe, MD [place]   In 1 month Cannady, Barbaraann Faster, NP [employer], [facility]

## 2022-02-16 NOTE — Telephone Encounter (Signed)
Noted  

## 2022-02-16 NOTE — Telephone Encounter (Signed)
Copied from [place] [phone]. Topic: Referral - Status >> Feb 15, 2022  4:52 PM Everette C wrote: Reason for CRM: The patient has an appt scheduled at [facility]   The appt will be 02/23/22 at 11 AM with Prov. Doran Heater   Please contact further if needed

## 2022-02-16 NOTE — Telephone Encounter (Signed)
Just an FYI

## 2022-02-21 ENCOUNTER — Ambulatory Visit: Payer: Medicaid Other | Admitting: Dermatology

## 2022-02-23 DIAGNOSIS — F331 Major depressive disorder, recurrent, moderate: Secondary | ICD-10-CM | POA: Diagnosis not present

## 2022-02-23 DIAGNOSIS — F411 Generalized anxiety disorder: Secondary | ICD-10-CM | POA: Diagnosis not present

## 2022-03-08 DIAGNOSIS — F331 Major depressive disorder, recurrent, moderate: Secondary | ICD-10-CM | POA: Diagnosis not present

## 2022-03-08 DIAGNOSIS — F411 Generalized anxiety disorder: Secondary | ICD-10-CM | POA: Diagnosis not present

## 2022-03-18 ENCOUNTER — Other Ambulatory Visit: Payer: Self-pay | Admitting: Dermatology

## 2022-03-18 DIAGNOSIS — L732 Hidradenitis suppurativa: Secondary | ICD-10-CM

## 2022-03-29 ENCOUNTER — Encounter: Payer: Self-pay | Admitting: Dermatology

## 2022-03-29 ENCOUNTER — Ambulatory Visit (INDEPENDENT_AMBULATORY_CARE_PROVIDER_SITE_OTHER): Payer: Medicaid Other | Admitting: Dermatology

## 2022-03-29 VITALS — BP 123/86 | HR 86

## 2022-03-29 DIAGNOSIS — Z79899 Other long term (current) drug therapy: Secondary | ICD-10-CM

## 2022-03-29 DIAGNOSIS — D235 Other benign neoplasm of skin of trunk: Secondary | ICD-10-CM

## 2022-03-29 DIAGNOSIS — L732 Hidradenitis suppurativa: Secondary | ICD-10-CM

## 2022-03-29 DIAGNOSIS — D239 Other benign neoplasm of skin, unspecified: Secondary | ICD-10-CM

## 2022-03-29 DIAGNOSIS — Z7189 Other specified counseling: Secondary | ICD-10-CM

## 2022-03-29 MED ORDER — DOXYCYCLINE MONOHYDRATE 100 MG PO CAPS: ORAL_CAPSULE | ORAL | 1 refills | Status: DC

## 2022-03-29 MED ORDER — SPIRONOLACTONE 25 MG PO TABS: ORAL_TABLET | ORAL | 2 refills | Status: DC

## 2022-03-29 NOTE — Patient Instructions (Addendum)
Discussed Isotretinoin therapy.  Isotretinoin Counseling; Review and Contraception Counseling: Reviewed potential side effects of isotretinoin including xerosis, cheilitis, hepatitis, hyperlipidemia, and severe birth defects if taken by a pregnant woman.  Women on isotretinoin must be celibate (not having sex) or required to use at least 2 birth control methods to prevent pregnancy (unless patient is a female of non-child bearing potential).  Females of child-bearing potential must have monthly pregnancy tests while on isotretinoin and report through I-Pledge (FDA monitoring program). Reviewed reports of suicidal ideation in those with a history of depression while taking isotretinoin and reports of diagnosis of inflammatory bowl disease (IBD) while taking isotretinoin as well as the lack of evidence for a causal relationship between isotretinoin, depression and IBD. Patient advised to reach out with any questions or concerns. Patient advised not to share pills or donate blood while on treatment or for one month after completing treatment. All patient's considering Isotretinoin must read and understand and sign Isotretinoin Consent Form and be registered with I-Pledge.     Continue Doxycycline and Spironolactone as directed. Stop Doxycycline 5 days prior to starting Isotretinoin.   Doxycycline should be taken with food to prevent nausea. Do not lay down for 30 minutes after taking. Be cautious with sun exposure and use good sun protection while on this medication. Pregnant women should not take this medication.    Spironolactone can cause increased urination and cause blood pressure to decrease. Please watch for signs of lightheadedness and be cautious when changing position. It can sometimes cause breast tenderness or an irregular period in premenopausal women. It can also increase potassium. The increase in potassium usually is not a concern unless you are taking other medicines that also increase  potassium, so please be sure your doctor knows all of the other medications you are taking. This medication should not be taken by pregnant women.  This medicine should also not be taken together with sulfa drugs like Bactrim (trimethoprim/sulfamethexazole).    Due to recent changes in healthcare laws, you may see results of your pathology and/or laboratory studies on MyChart before the doctors have had a chance to review them. We understand that in some cases there may be results that are confusing or concerning to you. Please understand that not all results are received at the same time and often the doctors may need to interpret multiple results in order to provide you with the best plan of care or course of treatment. Therefore, we ask that you please give [place] 2 business days to thoroughly review all your results before contacting the office for clarification. Should we see a critical lab result, you will be contacted sooner.   If You Need Anything After Your Visit  If you have any questions or concerns for your doctor, please call our main line at [phone] and press option 4 to reach your doctor's medical assistant. If no one answers, please leave a voicemail as directed and we will return your call as soon as possible. Messages left after 4 pm will be answered the following business day.   You may also send [place] a message via [place]. We typically respond to MyChart messages within 1-2 business days.  For prescription refills, please ask your pharmacy to contact our office. Our fax number is [phone].  If you have an urgent issue when the clinic is closed that cannot wait until the next business day, you can page your doctor at the number below.    Please note that while we  do our best to be available for urgent issues outside of office hours, we are not available 24/7.   If you have an urgent issue and are unable to reach [place], you may choose to seek medical care at your doctor's office,  retail clinic, urgent care center, or emergency room.  If you have a medical emergency, please immediately call 911 or go to the emergency department.  Pager Numbers  - Dr. Nehemiah Massed: [phone]  - Dr. Laurence Ferrari: [phone]  - Dr. Nicole Kindred: [phone]  In the event of inclement weather, please call our main line at [phone] for an update on the status of any delays or closures.  Dermatology Medication Tips: Please keep the boxes that topical medications come in in order to help keep track of the instructions about where and how to use these. Pharmacies typically print the medication instructions only on the boxes and not directly on the medication tubes.   If your medication is too expensive, please contact our office at [phone] option 4 or send [place] a message through [place].   We are unable to tell what your co-pay for medications will be in advance as this is different depending on your insurance coverage. However, we may be able to find a substitute medication at lower cost or fill out paperwork to get insurance to cover a needed medication.   If a prior authorization is required to get your medication covered by your insurance company, please allow [place] 1-2 business days to complete this process.  Drug prices often vary depending on where the prescription is filled and some pharmacies may offer cheaper prices.  The website www.goodrx.com contains coupons for medications through different pharmacies. The prices here do not account for what the cost may be with help from insurance (it may be cheaper with your insurance), but the website can give you the price if you did not use any insurance.  - You can print the associated coupon and take it with your prescription to the pharmacy.  - You may also stop by our office during regular business hours and pick up a GoodRx coupon card.  - If you need your prescription sent electronically to a different pharmacy, notify our office through  [facility] or by phone at [phone] option 4.     Si Usted Necesita Algo Despus de Su Visita  Tambin puede enviarnos un mensaje a travs de Pharmacist, community. Por lo general respondemos a los mensajes de MyChart en el transcurso de 1 a 2 das hbiles.  Para renovar recetas, por favor pida a su farmacia que se ponga en contacto con nuestra oficina. Harland Dingwall de fax es [place] [phone].  Si tiene un asunto urgente cuando la clnica est cerrada y que no puede esperar hasta el siguiente da hbil, puede llamar/localizar a su doctor(a) al nmero que aparece a continuacin.   Por favor, tenga en cuenta que aunque hacemos todo lo posible para estar disponibles para asuntos urgentes fuera del horario de [place], no estamos disponibles las 24 horas del da, los 7 das de la [place].   Si tiene un problema urgente y no puede comunicarse con nosotros, puede optar por buscar atencin mdica  en el consultorio de su doctor(a), en una clnica privada, en un centro de atencin urgente o en una sala de emergencias.  Si tiene Engineering geologist, por favor llame inmediatamente al 911 o vaya a la sala de emergencias.  Nmeros de bper  - Dr. Nehemiah Massed: [phone]  - Dra. Moye:  [phone]  - Dra. Nicole Kindred: [phone]  En caso de inclemencias del [place], por favor llame a Johnsie Kindred principal al [phone] para una actualizacin sobre el [place] de cualquier retraso o cierre.  Consejos para la medicacin en dermatologa: Por favor, guarde las cajas en las que vienen los medicamentos de uso tpico para ayudarle a seguir las instrucciones sobre dnde y cmo usarlos. Las farmacias generalmente imprimen las instrucciones del medicamento slo en las cajas y no directamente en los tubos del [place].   Si su medicamento es muy caro, por favor, pngase en contacto con Zigmund Daniel llamando al [phone] y presione la opcin 4 o envenos un mensaje a travs de Pharmacist, community.   No podemos  decirle cul ser su copago por los medicamentos por adelantado ya que esto es diferente dependiendo de la cobertura de su seguro. Sin embargo, es posible que podamos encontrar un medicamento sustituto a Electrical engineer un formulario para que el seguro cubra el medicamento que se considera necesario.   Si se requiere una autorizacin previa para que su compaa de seguros [place] su medicamento, por favor permtanos de 1 a 2 das hbiles para completar este proceso.  Los precios de los medicamentos varan con frecuencia dependiendo del Environmental consultant de dnde se surte la receta y alguna farmacias pueden ofrecer precios ms baratos.  El sitio web www.goodrx.com tiene cupones para medicamentos de Airline pilot. Los precios aqu no tienen en cuenta lo que podra costar con la ayuda del seguro (puede ser ms barato con su seguro), pero el sitio web puede darle el precio si no utiliz Research scientist (physical sciences).  - Puede imprimir el cupn correspondiente y llevarlo con su receta a la farmacia.  - Tambin puede pasar por nuestra oficina durante el horario de atencin regular y Charity fundraiser una tarjeta de cupones de GoodRx.  - Si necesita que su receta se enve electrnicamente a una farmacia diferente, informe a nuestra oficina a travs de MyChart de [place] o por telfono llamando al [phone] y presione la opcin 4.

## 2022-03-29 NOTE — Progress Notes (Signed)
Follow-Up Visit   Subjective  Julia Blanchard is a 31 y.o. female who presents for the following: hidradenitis suppurativa (4 month recheck. Taking Doxycycline '100mg'$  once daily. Taking Spironolactone '50mg'$  at bedtime. Patient reports she is a little better. Not flaring as often).  The following portions of the chart were reviewed this encounter and updated as appropriate:  Tobacco  Allergies  Meds  Problems  Med Hx  Surg Hx  Fam Hx     Review of Systems: No other skin or systemic complaints except as noted in HPI or Assessment and Plan.  Objective  Well appearing patient in no apparent distress; mood and affect are within normal limits.  A focused examination was performed including B/L axilla, B/L thighs. Relevant physical exam findings are noted in the Assessment and Plan.  Left Axilla Scarring and cystic papule at left axilla  Back Red papules   Assessment & Plan  Hidradenitis suppurativa Axilla Chronic and persistent condition with duration or expected duration over one year. Condition is bothersome/symptomatic for patient. Currently flared.  Has birth control Nexplanon implant in arm. Will expire 09/2021  Plan to start Isotretinoin pending 30 day wait period and 2 negative pregnancy tests.  iPledge: [phone] BC methods: Nexplanon implant, female condoms Pharmacy: [facility]. [address]  Discussed Isotretinoin therapy.  Isotretinoin Counseling; Review and Contraception Counseling: Reviewed potential side effects of isotretinoin including xerosis, cheilitis, hepatitis, hyperlipidemia, and severe birth defects if taken by a pregnant woman.  Women on isotretinoin must be celibate (not having sex) or required to use at least 2 birth control methods to prevent pregnancy (unless patient is a female of non-child bearing potential).  Females of child-bearing potential must have monthly pregnancy tests while on isotretinoin and report through I-Pledge (FDA  monitoring program). Reviewed reports of suicidal ideation in those with a history of depression while taking isotretinoin and reports of diagnosis of inflammatory bowl disease (IBD) while taking isotretinoin as well as the lack of evidence for a causal relationship between isotretinoin, depression and IBD. Patient advised to reach out with any questions or concerns. Patient advised not to share pills or donate blood while on treatment or for one month after completing treatment. All patient's considering Isotretinoin must read and understand and sign Isotretinoin Consent Form and be registered with I-Pledge.   Patient registered in [place].   Urine pregnancy test performed in office today and was negative.  Patient demonstrates comprehension and confirms she will not get pregnant.    Continue Doxycycline and Spironolactone as directed. Stop Doxycycline 5 days prior to starting Isotretinoin.   Doxycycline should be taken with food to prevent nausea. Do not lay down for 30 minutes after taking. Be cautious with sun exposure and use good sun protection while on this medication. Pregnant women should not take this medication.   Spironolactone can cause increased urination and cause blood pressure to decrease. Please watch for signs of lightheadedness and be cautious when changing position. It can sometimes cause breast tenderness or an irregular period in premenopausal women. It can also increase potassium. The increase in potassium usually is not a concern unless you are taking other medicines that also increase potassium, so please be sure your doctor knows all of the other medications you are taking. This medication should not be taken by pregnant women.  This medicine should also not be taken together with sulfa drugs like Bactrim (trimethoprim/sulfamethexazole).   Related Medications doxycycline (MONODOX) 100 MG capsule Take 1 capsule once daily with dinner  spironolactone (ALDACTONE) 25 MG  tablet TAKE 2 TABLETS BY MOUTH ONCE DAILY  Angiokeratomas Back Chronic and persistent condition with duration or expected duration over one year. Condition is symptomatic / bothersome to patient. Not to goal.  From: Julia E Mecum, PA-C  I have reviewed your Fabry's testing results and it does not look like you have the condition. I have looped Dr. Nehemiah Blanchard in on the results so he is aware.   Result Care Coordination   Result Notes   Julia Bathe, MD 04/05/2022 12:28 PM EST Back to Top    Alpha-Galactosidase activity >35.5 nmol/hr/mg prt 53.3 Interpretation Comment Comment: Unaffected The above enzyme activity is consistent with this patient being unaffected with alpha-galactosidase A deficiency. 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 Note: Due to random X-chromosome inactivation, these results may not reflect Fabry disease carrier status in females.   This testing indicates pt is NOT affected by Alpha-galactosidase A deficiency and Does NOT have Fabry Disease. 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 This information and testing has been reviewed and discussed with patient by her PCP, Julia Guarneri NP, and by me (Dr Julia Blanchard)           Return in about 30 days (around 04/28/2022).  I, Julia Blanchard, CMA, am acting as scribe for Julia Ser, MD. Documentation: I have reviewed the above documentation for accuracy and completeness, and I agree with the above.  Julia Ser, MD

## 2022-03-30 DIAGNOSIS — F411 Generalized anxiety disorder: Secondary | ICD-10-CM | POA: Diagnosis not present

## 2022-03-30 DIAGNOSIS — F331 Major depressive disorder, recurrent, moderate: Secondary | ICD-10-CM | POA: Diagnosis not present

## 2022-04-07 ENCOUNTER — Encounter: Payer: Self-pay | Admitting: Dermatology

## 2022-04-14 DIAGNOSIS — E7800 Pure hypercholesterolemia, unspecified: Secondary | ICD-10-CM | POA: Insufficient documentation

## 2022-04-14 NOTE — Patient Instructions (Signed)

## 2022-04-16 ENCOUNTER — Ambulatory Visit (INDEPENDENT_AMBULATORY_CARE_PROVIDER_SITE_OTHER): Payer: Medicaid Other | Admitting: Nurse Practitioner

## 2022-04-16 ENCOUNTER — Encounter: Payer: Self-pay | Admitting: Nurse Practitioner

## 2022-04-16 VITALS — BP 109/66 | HR 83 | Temp 98.0°F | Ht 60.0 in | Wt 242.2 lb

## 2022-04-16 DIAGNOSIS — E559 Vitamin D deficiency, unspecified: Secondary | ICD-10-CM | POA: Diagnosis not present

## 2022-04-16 DIAGNOSIS — Z1159 Encounter for screening for other viral diseases: Secondary | ICD-10-CM

## 2022-04-16 DIAGNOSIS — Z6841 Body Mass Index (BMI) 40.0 and over, adult: Secondary | ICD-10-CM

## 2022-04-16 DIAGNOSIS — F4323 Adjustment disorder with mixed anxiety and depressed mood: Secondary | ICD-10-CM | POA: Diagnosis not present

## 2022-04-16 DIAGNOSIS — E7800 Pure hypercholesterolemia, unspecified: Secondary | ICD-10-CM | POA: Diagnosis not present

## 2022-04-16 DIAGNOSIS — Z8632 Personal history of gestational diabetes: Secondary | ICD-10-CM | POA: Diagnosis not present

## 2022-04-16 MED ORDER — BUSPIRONE HCL 5 MG PO TABS: 5.0000 mg | ORAL_TABLET | Freq: Two times a day (BID) | ORAL | 3 refills | Status: DC

## 2022-04-16 MED ORDER — VENLAFAXINE HCL ER 75 MG PO CP24: 75.0000 mg | ORAL_CAPSULE | Freq: Every day | ORAL | 6 refills | Status: DC

## 2022-04-16 NOTE — Assessment & Plan Note (Signed)
Ongoing, noted past labs.  Recheck today and continue diet and exercise focus.

## 2022-04-16 NOTE — Assessment & Plan Note (Signed)
Check A1c annually, labs today.

## 2022-04-16 NOTE — Assessment & Plan Note (Signed)
Chronic, ongoing.  Denies SI/HI. At this time: - Increase Effexor to 75 MG XL dosing and add on Buspar 5 MG BID.  She does endorse more anxiety vs depression, however to help anhedonia and decreased sex drive may benefit from addition of Wellbutrin XL 150 MG in future along with current regimen.  Educated her on this. - Continue therapy sessions and recommend discussing decreased sex drive and past trauma with them. - Obtain yearly labs today. - Return in 5 weeks.
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
## 2022-04-16 NOTE — Assessment & Plan Note (Signed)
BMI 47.30.  Recommended eating smaller high protein, low fat meals more frequently and exercising 30 mins a day 5 times a week with a goal of 10-15lb weight loss in the next 3 months. Patient voiced their understanding and motivation to adhere to these recommendations.

## 2022-04-17 LAB — LIPID PANEL W/O CHOL/HDL RATIO
Cholesterol, Total: 160 mg/dL (ref 100–199)
HDL: 47 mg/dL (ref >39)
LDL Chol Calc (NIH): 96 mg/dL (ref 0–99)
Triglycerides: 90 mg/dL (ref 0–149)
VLDL Cholesterol Cal: 17 mg/dL (ref 5–40)

## 2022-04-17 LAB — HEPATITIS C ANTIBODY: Hep C Virus Ab: NONREACTIVE

## 2022-04-17 LAB — COMPREHENSIVE METABOLIC PANEL
ALT: 15 IU/L (ref 0–32)
AST: 15 IU/L (ref 0–40)
Albumin/Globulin Ratio: 1.9 (ref 1.2–2.2)
Albumin: 4.2 g/dL (ref 4.0–5.0)
Alkaline Phosphatase: 82 IU/L (ref 44–121)
BUN/Creatinine Ratio: 23 (ref 9–23)
BUN: 20 mg/dL (ref 6–20)
Bilirubin Total: <0.2 mg/dL (ref 0.0–1.2)
CO2: 20 mmol/L (ref 20–29)
Calcium: 9.2 mg/dL (ref 8.7–10.2)
Chloride: 104 mmol/L (ref 96–106)
Creatinine, Ser: 0.87 mg/dL (ref 0.57–1.00)
Globulin, Total: 2.2 g/dL (ref 1.5–4.5)
Glucose: 100 mg/dL — ABNORMAL HIGH (ref 70–99)
Potassium: 4.2 mmol/L (ref 3.5–5.2)
Sodium: 140 mmol/L (ref 134–144)
Total Protein: 6.4 g/dL (ref 6.0–8.5)
eGFR: 92 mL/min/{1.73_m2} (ref >59)

## 2022-04-17 LAB — CBC WITH DIFFERENTIAL/PLATELET
Basophils Absolute: 0 10*3/uL (ref 0.0–0.2)
Basos: 1 %
EOS (ABSOLUTE): 0.2 10*3/uL (ref 0.0–0.4)
Eos: 2 %
Hematocrit: 37.1 % (ref 34.0–46.6)
Hemoglobin: 12.5 g/dL (ref 11.1–15.9)
Immature Grans (Abs): 0 10*3/uL (ref 0.0–0.1)
Immature Granulocytes: 0 %
Lymphocytes Absolute: 2.6 10*3/uL (ref 0.7–3.1)
Lymphs: 31 %
MCH: 28.8 pg (ref 26.6–33.0)
MCHC: 33.7 g/dL (ref 31.5–35.7)
MCV: 86 fL (ref 79–97)
Monocytes Absolute: 0.4 10*3/uL (ref 0.1–0.9)
Monocytes: 5 %
Neutrophils Absolute: 5 10*3/uL (ref 1.4–7.0)
Neutrophils: 61 %
Platelets: 264 10*3/uL (ref 150–450)
RBC: 4.34 x10E6/uL (ref 3.77–5.28)
RDW: 12.3 % (ref 11.7–15.4)
WBC: 8.3 10*3/uL (ref 3.4–10.8)

## 2022-04-17 LAB — VITAMIN D 25 HYDROXY (VIT D DEFICIENCY, FRACTURES): Vit D, 25-Hydroxy: 20.3 ng/mL — ABNORMAL LOW (ref 30.0–100.0)

## 2022-04-17 LAB — TSH: TSH: 1.8 u[IU]/mL (ref 0.450–4.500)

## 2022-04-17 LAB — HEMOGLOBIN A1C
Est. average glucose Bld gHb Est-mCnc: 114 mg/dL
Hgb A1c MFr Bld: 5.6 % (ref 4.8–5.6)

## 2022-04-17 NOTE — Progress Notes (Signed)
Contacted via [place] [facility] [place], your labs have returned: - Kidney function, creatinine and eGFR, remains normal, as is liver function, AST and ALT. Sugar, glucose, level a little elevated but A1c, diabetes testing, shows no diabetes or prediabetes. - Vitamin D is on low side, I highly recommend taking Vitamin D3 2000 units daily which you can obtain over the counter in supplement section. - CBC shows no anemia or infection. - Cholesterol labs look good:) - Thyroid lab, TSH, is normal.  Any questions? Keep being amazing!!  Thank you for allowing me to participate in your care.  I appreciate you. Kindest regards, Aymar Whitfill

## 2022-04-19 ENCOUNTER — Ambulatory Visit: Payer: Medicaid Other | Admitting: Dermatology

## 2022-04-25 ENCOUNTER — Encounter: Payer: Medicaid Other | Admitting: Internal Medicine

## 2022-04-30 ENCOUNTER — Encounter: Payer: Self-pay | Admitting: Dermatology

## 2022-04-30 ENCOUNTER — Ambulatory Visit (INDEPENDENT_AMBULATORY_CARE_PROVIDER_SITE_OTHER): Payer: Medicaid Other | Admitting: Dermatology

## 2022-04-30 VITALS — BP 104/71 | HR 80

## 2022-04-30 DIAGNOSIS — D2361 Other benign neoplasm of skin of right upper limb, including shoulder: Secondary | ICD-10-CM | POA: Diagnosis not present

## 2022-04-30 DIAGNOSIS — D239 Other benign neoplasm of skin, unspecified: Secondary | ICD-10-CM

## 2022-04-30 DIAGNOSIS — Z79899 Other long term (current) drug therapy: Secondary | ICD-10-CM

## 2022-04-30 DIAGNOSIS — L732 Hidradenitis suppurativa: Secondary | ICD-10-CM

## 2022-04-30 DIAGNOSIS — D235 Other benign neoplasm of skin of trunk: Secondary | ICD-10-CM

## 2022-04-30 DIAGNOSIS — D2362 Other benign neoplasm of skin of left upper limb, including shoulder: Secondary | ICD-10-CM | POA: Diagnosis not present

## 2022-04-30 NOTE — Progress Notes (Signed)
   Follow-Up Visit   Subjective  Julia Blanchard is a 32 y.o. female who presents for the following: Follow-up (Hidradenitis Suppurativa. Taking Doxycycline 100 mg once daily with dinner. Taking Spironolactone 50 mg once daily. Unsure if wants to start Isotretinoin therapy).  The following portions of the chart were reviewed this encounter and updated as appropriate:  Tobacco  Allergies  Meds  Problems  Med Hx  Surg Hx  Fam Hx     Review of Systems: No other skin or systemic complaints except as noted in HPI or Assessment and Plan.  Objective  Well appearing patient in no apparent distress; mood and affect are within normal limits.  A focused examination was performed including axillae. Relevant physical exam findings are noted in the Assessment and Plan.  Left Axilla Scarring and cystic papule at left axilla   Back, arms Scattered red papules   Assessment & Plan  Hidradenitis suppurativa  Axilla Chronic and persistent condition with duration or expected duration over one year. Condition is symptomatic/ bothersome to patient. Not currently at goal.  Hidradenitis Suppurativa is a chronic; persistent; non-curable, but treatable condition due to abnormal inflamed sweat glands in the body folds (axilla, inframammary, groin, medial thighs), causing recurrent painful draining cysts and scarring. It can be associated with severe scarring acne and cysts; also abscesses and scarring of scalp. The goal is control and prevention of flares, as it is not curable. Scars are permanent and can be thickened. Treatment may include daily use of topical medication and oral antibiotics.  Oral isotretinoin may also be helpful.  For some cases, Humira or Cosentyx (biologic injections) may be prescribed to decrease the inflammatory process and prevent flares.  When indicated, inflamed cysts may also be treated surgically.   Do not recommend laser treatments for angiokeratomas while on Isotretinoin  therapy or for 6 months afterwards.   Related Medications doxycycline (MONODOX) 100 MG capsule Take 1 capsule once daily with dinner spironolactone (ALDACTONE) 25 MG tablet TAKE 2 TABLETS BY MOUTH ONCE DAILY  Angiokeratoma Back, arms Counseling for BBL / IPL / Laser and Coordination of Care Discussed the treatment option of Broad Band Light (BBL) Intense Pulsed Light (IPL) / Laser.  Typically we recommend at least 1-3 treatment sessions about 5-8 weeks apart for best results.  The patient's condition may require "maintenance treatments" in the future.  The fee for BBL / laser treatments is $350 per treatment session for the whole face.  A fee can be quoted for other parts of the body. Insurance typically does not pay for BBL/laser treatments and therefore the fee is an out-of-pocket cost.  Return for BBL , Next Available.  I, Emelia Salisbury, CMA, am acting as scribe for Sarina Ser, MD. Documentation: I have reviewed the above documentation for accuracy and completeness, and I agree with the above.  Sarina Ser, MD

## 2022-04-30 NOTE — Patient Instructions (Signed)
Due to recent changes in healthcare laws, you may see results of your pathology and/or laboratory studies on MyChart before the doctors have had a chance to review them. We understand that in some cases there may be results that are confusing or concerning to you. Please understand that not all results are received at the same time and often the doctors may need to interpret multiple results in order to provide you with the best plan of care or course of treatment. Therefore, we ask that you please give us 2 business days to thoroughly review all your results before contacting the office for clarification. Should we see a critical lab result, you will be contacted sooner.   If You Need Anything After Your Visit  If you have any questions or concerns for your doctor, please call our main line at [phone] and press option 4 to reach your doctor's medical assistant. If no one answers, please leave a voicemail as directed and we will return your call as soon as possible. Messages left after 4 pm will be answered the following business day.   You may also send us a message via MyChart. We typically respond to MyChart messages within 1-2 business days.  For prescription refills, please ask your pharmacy to contact our office. Our fax number is [phone].  If you have an urgent issue when the clinic is closed that cannot wait until the next business day, you can page your doctor at the number below.    Please note that while we do our best to be available for urgent issues outside of office hours, we are not available 24/7.   If you have an urgent issue and are unable to reach us, you may choose to seek medical care at your doctor's office, retail clinic, urgent care center, or emergency room.  If you have a medical emergency, please immediately call 911 or go to the emergency department.  Pager Numbers  - Dr. Kowalski: [phone]  - Dr. Moye: [phone]  - Dr. Stewart:  [phone]  In the event of inclement weather, please call our main line at [phone] for an update on the status of any delays or closures.  Dermatology Medication Tips: Please keep the boxes that topical medications come in in order to help keep track of the instructions about where and how to use these. Pharmacies typically print the medication instructions only on the boxes and not directly on the medication tubes.   If your medication is too expensive, please contact our office at [phone] option 4 or send us a message through MyChart.   We are unable to tell what your co-pay for medications will be in advance as this is different depending on your insurance coverage. However, we may be able to find a substitute medication at lower cost or fill out paperwork to get insurance to cover a needed medication.   If a prior authorization is required to get your medication covered by your insurance company, please allow us 1-2 business days to complete this process.  Drug prices often vary depending on where the prescription is filled and some pharmacies may offer cheaper prices.  The website www.goodrx.com contains coupons for medications through different pharmacies. The prices here do not account for what the cost may be with help from insurance (it may be cheaper with your insurance), but the website can give you the price if you did not use any insurance.  - You can print the associated coupon and take it with   your prescription to the pharmacy.  - You may also stop by our office during regular business hours and pick up a GoodRx coupon card.  - If you need your prescription sent electronically to a different pharmacy, notify our office through [facility] MyChart or by phone at [phone] option 4.     Si Usted Necesita Algo Despus de Su Visita  Tambin puede enviarnos un mensaje a travs de MyChart. Por lo general respondemos a los mensajes de MyChart en el transcurso de 1 a 2  das hbiles.  Para renovar recetas, por favor pida a su farmacia que se ponga en contacto con nuestra oficina. Nuestro nmero de fax es el [phone].  Si tiene un asunto urgente cuando la clnica est cerrada y que no puede esperar hasta el siguiente da hbil, puede llamar/localizar a su doctor(a) al nmero que aparece a continuacin.   Por favor, tenga en cuenta que aunque hacemos todo lo posible para estar disponibles para asuntos urgentes fuera del horario de oficina, no estamos disponibles las 24 horas del da, los 7 das de la semana.   Si tiene un problema urgente y no puede comunicarse con nosotros, puede optar por buscar atencin mdica  en el consultorio de su doctor(a), en una clnica privada, en un centro de atencin urgente o en una sala de emergencias.  Si tiene una emergencia mdica, por favor llame inmediatamente al 911 o vaya a la sala de emergencias.  Nmeros de bper  - Dr. Kowalski: [phone]  - Dra. Moye: [phone]  - Dra. Stewart: [phone]  En caso de inclemencias del tiempo, por favor llame a nuestra lnea principal al [phone] para una actualizacin sobre el estado de cualquier retraso o cierre.  Consejos para la medicacin en dermatologa: Por favor, guarde las cajas en las que vienen los medicamentos de uso tpico para ayudarle a seguir las instrucciones sobre dnde y cmo usarlos. Las farmacias generalmente imprimen las instrucciones del medicamento slo en las cajas y no directamente en los tubos del medicamento.   Si su medicamento es muy caro, por favor, pngase en contacto con nuestra oficina llamando al [phone] y presione la opcin 4 o envenos un mensaje a travs de MyChart.   No podemos decirle cul ser su copago por los medicamentos por adelantado ya que esto es diferente dependiendo de la cobertura de su seguro. Sin embargo, es posible que podamos encontrar un medicamento sustituto a menor costo o llenar un formulario para que el  seguro cubra el medicamento que se considera necesario.   Si se requiere una autorizacin previa para que su compaa de seguros cubra su medicamento, por favor permtanos de 1 a 2 das hbiles para completar este proceso.  Los precios de los medicamentos varan con frecuencia dependiendo del lugar de dnde se surte la receta y alguna farmacias pueden ofrecer precios ms baratos.  El sitio web www.goodrx.com tiene cupones para medicamentos de diferentes farmacias. Los precios aqu no tienen en cuenta lo que podra costar con la ayuda del seguro (puede ser ms barato con su seguro), pero el sitio web puede darle el precio si no utiliz ningn seguro.  - Puede imprimir el cupn correspondiente y llevarlo con su receta a la farmacia.  - Tambin puede pasar por nuestra oficina durante el horario de atencin regular y recoger una tarjeta de cupones de GoodRx.  - Si necesita que su receta se enve electrnicamente a una farmacia diferente, informe a nuestra oficina a travs de MyChart de [facility]   o por telfono llamando al [phone] y presione la opcin 4.  

## 2022-05-03 DIAGNOSIS — F331 Major depressive disorder, recurrent, moderate: Secondary | ICD-10-CM | POA: Diagnosis not present

## 2022-05-03 DIAGNOSIS — F411 Generalized anxiety disorder: Secondary | ICD-10-CM | POA: Diagnosis not present

## 2022-05-09 ENCOUNTER — Encounter: Payer: Self-pay | Admitting: Dermatology

## 2022-05-14 DIAGNOSIS — F411 Generalized anxiety disorder: Secondary | ICD-10-CM | POA: Diagnosis not present

## 2022-05-20 DIAGNOSIS — E559 Vitamin D deficiency, unspecified: Secondary | ICD-10-CM | POA: Insufficient documentation

## 2022-05-20 NOTE — Patient Instructions (Incomplete)
Start reducing Effexor to 50 MG daily for one week and then reduce to 25 MG daily for one week, then stop.  Start Lexapro 5 MG daily.  Continues Buspar 5 MG BID.  Managing Anxiety, Adult After being diagnosed with anxiety, you may be relieved to know why you have felt or behaved a certain way. You may also feel overwhelmed about the treatment ahead and what it will mean for your life. With care and support, you can manage this condition. How to manage lifestyle changes Managing stress and anxiety  Stress is your body's reaction to life changes and events, both good and bad. Most stress will last just a few hours, but stress can be ongoing and can lead to more than just stress. Although stress can play a major role in anxiety, it is not the same as anxiety. Stress is usually caused by something external, such as a deadline, test, or competition. Stress normally passes after the triggering event has ended.  Anxiety is caused by something internal, such as imagining a terrible outcome or worrying that something will go wrong that will devastate you. Anxiety often does not go away even after the triggering event is over, and it can become long-term (chronic) worry. It is important to understand the differences between stress and anxiety and to manage your stress effectively so that it does not lead to an anxious response. Talk with your health care provider or a counselor to learn more about reducing anxiety and stress. He or she may suggest tension reduction techniques, such as: Music therapy. Spend time creating or listening to music that you enjoy and that inspires you. Mindfulness-based meditation. Practice being aware of your normal breaths while not trying to control your breathing. It can be done while sitting or walking. Centering prayer. This involves focusing on a word, phrase, or sacred image that means something to you and brings you peace. Deep breathing. To do this, expand your stomach and  inhale slowly through your nose. Hold your breath for 3-5 seconds. Then exhale slowly, letting your stomach muscles relax. Self-talk. Learn to notice and identify thought patterns that lead to anxiety reactions and change those patterns to thoughts that feel peaceful. Muscle relaxation. Taking time to tense muscles and then relax them. Choose a tension reduction technique that fits your lifestyle and personality. These techniques take time and practice. Set aside 5-15 minutes a day to do them. Therapists can offer counseling and training in these techniques. The training to help with anxiety may be covered by some insurance plans. Other things you can do to manage stress and anxiety include: Keeping a stress diary. This can help you learn what triggers your reaction and then learn ways to manage your response. Thinking about how you react to certain situations. You may not be able to control everything, but you can control your response. Making time for activities that help you relax and not feeling guilty about spending your time in this way. Doing visual imagery. This involves imagining or creating mental pictures to help you relax. Practicing yoga. Through yoga poses, you can lower tension and promote relaxation.  Medicines Medicines can help ease symptoms. Medicines for anxiety include: Antidepressant medicines. These are usually prescribed for long-term daily control. Anti-anxiety medicines. These may be added in severe cases, especially when panic attacks occur. Medicines will be prescribed by a health care provider. When used together, medicines, psychotherapy, and tension reduction techniques may be the most effective treatment. Relationships Relationships can play a  big part in helping you recover. Try to spend more time connecting with trusted friends and family members. Consider going to couples counseling if you have a partner, taking family education classes, or going to family  therapy. Therapy can help you and others better understand your condition. How to recognize changes in your anxiety Everyone responds differently to treatment for anxiety. Recovery from anxiety happens when symptoms decrease and stop interfering with your daily activities at home or work. This may mean that you will start to: Have better concentration and focus. Worry will interfere less in your daily thinking. Sleep better. Be less irritable. Have more energy. Have improved memory. It is also important to recognize when your condition is getting worse. Contact your health care provider if your symptoms interfere with home or work and you feel like your condition is not improving. Follow these instructions at home: Activity Exercise. Adults should do the following: Exercise for at least 150 minutes each week. The exercise should increase your heart rate and make you sweat (moderate-intensity exercise). Strengthening exercises at least twice a week. Get the right amount and quality of sleep. Most adults need 7-9 hours of sleep each night. Lifestyle  Eat a healthy diet that includes plenty of vegetables, fruits, whole grains, low-fat dairy products, and lean protein. Do not eat a lot of foods that are high in fats, added sugars, or salt (sodium). Make choices that simplify your life. Do not use any products that contain nicotine or tobacco. These products include cigarettes, chewing tobacco, and vaping devices, such as e-cigarettes. If you need help quitting, ask your health care provider. Avoid caffeine, alcohol, and certain over-the-counter cold medicines. These may make you feel worse. Ask your pharmacist which medicines to avoid. General instructions Take over-the-counter and prescription medicines only as told by your health care provider. Keep all follow-up visits. This is important. Where to find support You can get help and support from these sources: Self-help groups. Online and  [employer]. A trusted spiritual leader. Couples counseling. Family education classes. Family therapy. Where to find more information You may find that joining a support group helps you deal with your anxiety. The following sources can help you locate counselors or support groups near you: [place]: www.mentalhealthamerica.net Anxiety and Depression Association of [place] (ADAA): https://www.clark.net/ National Alliance on Mental Illness (NAMI): www.nami.org Contact a health care provider if: You have a hard time staying focused or finishing daily tasks. You spend many hours a day feeling worried about everyday life. You become exhausted by worry. You start to have headaches or frequently feel tense. You develop chronic nausea or diarrhea. Get help right away if: You have a racing heart and shortness of breath. You have thoughts of hurting yourself or others. If you ever feel like you may hurt yourself or others, or have thoughts about taking your own life, get help right away. Go to your nearest emergency department or: Call your local emergency services (911 in the [place] Call a suicide crisis helpline, such as the [facility] at 857-589-4582 or 988 in the [place]. This is open 24 hours a day in the [place] Text the Crisis Text Line at (330) 806-5414 (in the [place].). Summary Taking steps to learn and use tension reduction techniques can help calm you and help prevent triggering an anxiety reaction. When used together, medicines, psychotherapy, and tension reduction techniques may be the most effective treatment. Family, friends, and partners can play a big part in supporting you. This  information is not intended to replace advice given to you by your health care provider. Make sure you discuss any questions you have with your health care provider. Document Revised: 10/19/2020 Document Reviewed: 07/17/2020 Elsevier Patient Education  [place].

## 2022-05-21 ENCOUNTER — Ambulatory Visit (INDEPENDENT_AMBULATORY_CARE_PROVIDER_SITE_OTHER): Payer: Medicaid Other | Admitting: Nurse Practitioner

## 2022-05-21 ENCOUNTER — Encounter: Payer: Self-pay | Admitting: Nurse Practitioner

## 2022-05-21 VITALS — BP 123/76 | HR 83 | Temp 98.5°F | Ht 60.0 in | Wt 243.9 lb

## 2022-05-21 DIAGNOSIS — F4323 Adjustment disorder with mixed anxiety and depressed mood: Secondary | ICD-10-CM

## 2022-05-21 MED ORDER — ESCITALOPRAM OXALATE 5 MG PO TABS: 5.0000 mg | ORAL_TABLET | Freq: Every day | ORAL | 0 refills | Status: DC

## 2022-05-21 MED ORDER — VENLAFAXINE HCL 50 MG PO TABS: 50.0000 mg | ORAL_TABLET | Freq: Every day | ORAL | 0 refills | Status: DC
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
## 2022-05-21 NOTE — Assessment & Plan Note (Addendum)
Chronic, ongoing.  Denies SI/HI.  At this time will cross titrate to reduce off Effexor and bring in Lexapro.  Wrote down instructions on how to do this: has 50 MG and 25 MG Effexor doses at home -- will reduce off this over next two weeks and bring in Lexapro starting at 5 MG.  Continue Buspar 5 MG BID. Continue therapy at this time. Could consider Trintellix in future if no benefit from SSRI or SNRI.  Return in 4 weeks.

## 2022-06-06 IMAGING — MG MM BREAST LOCALIZATION CLIP
4 series · 4 of 12 positions shown · non-contrast
Comparison: Previous exam(s).

CLINICAL DATA: Status post ultrasound-guided biopsy

EXAM:
3D DIAGNOSTIC RIGHT MAMMOGRAM POST ULTRASOUND BIOPSY

[R ML synth-2D]
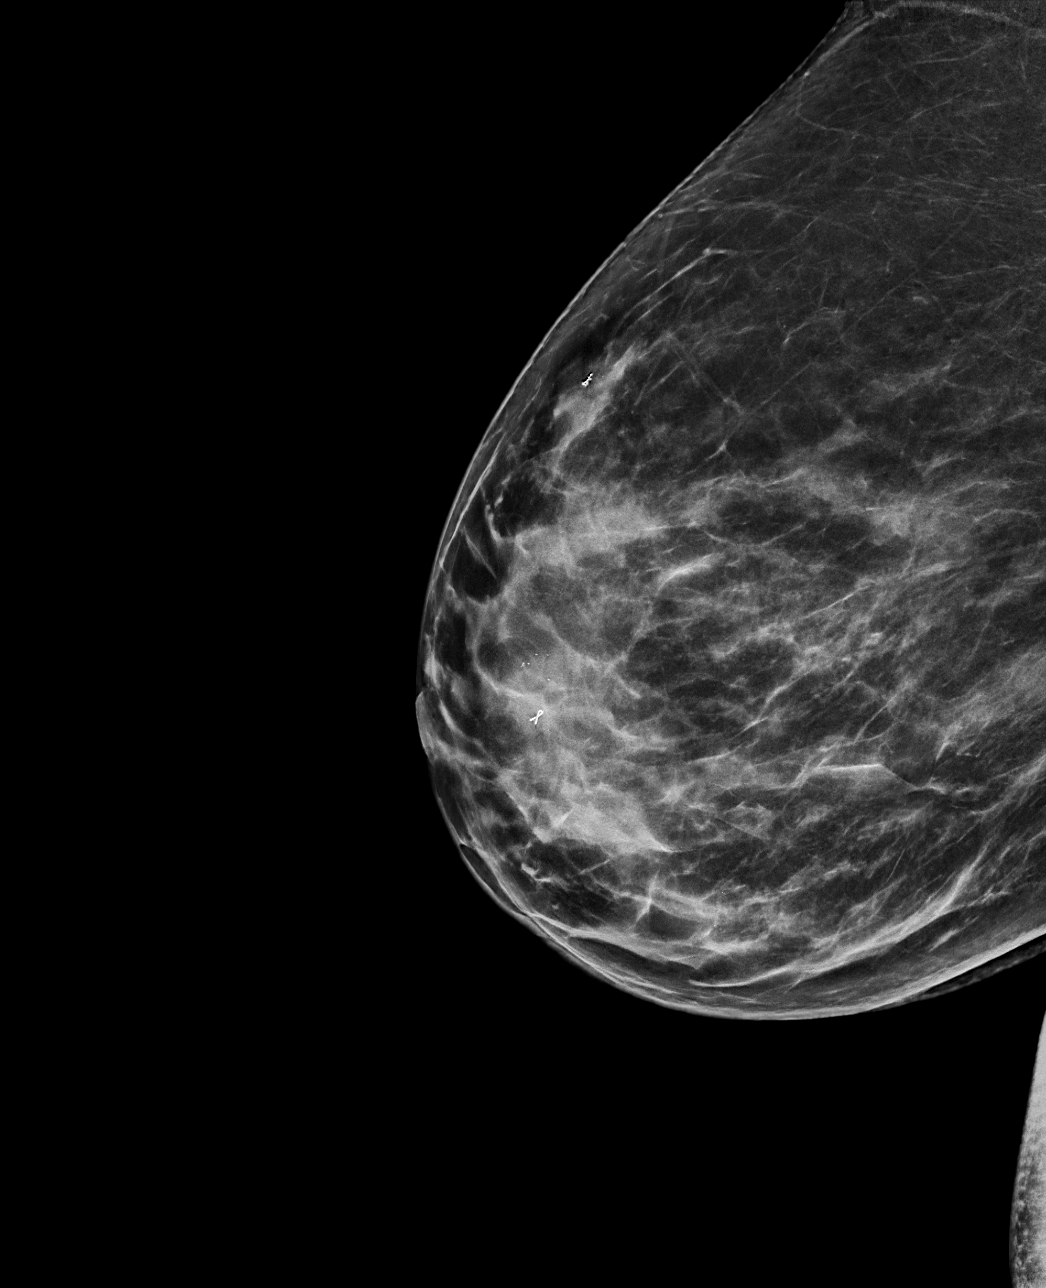

[R CC synth-2D]
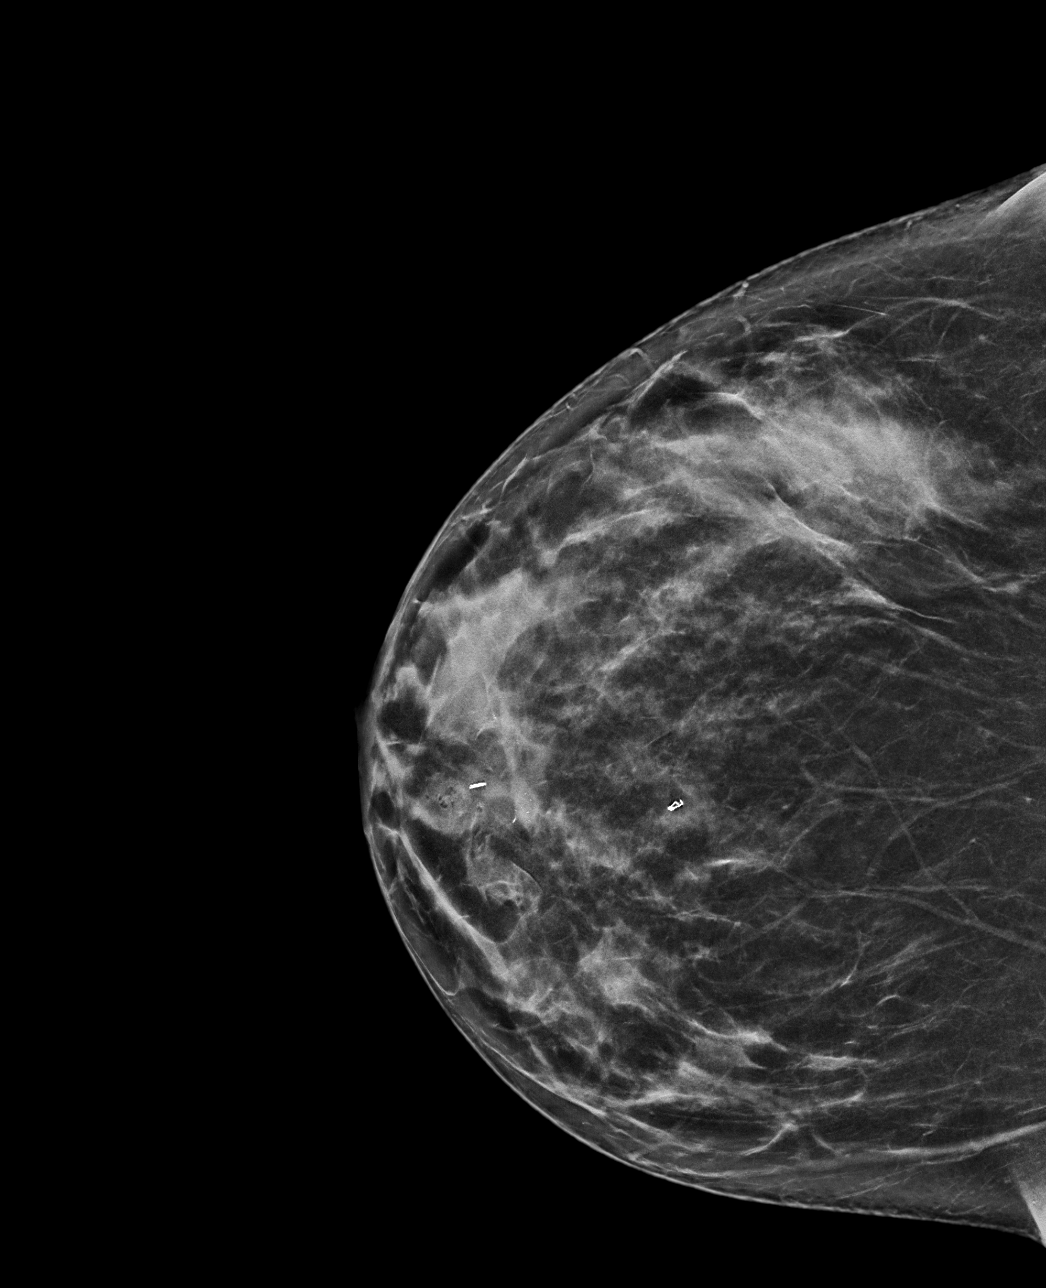

[R CC tomo · tomo slice 37/74.0]
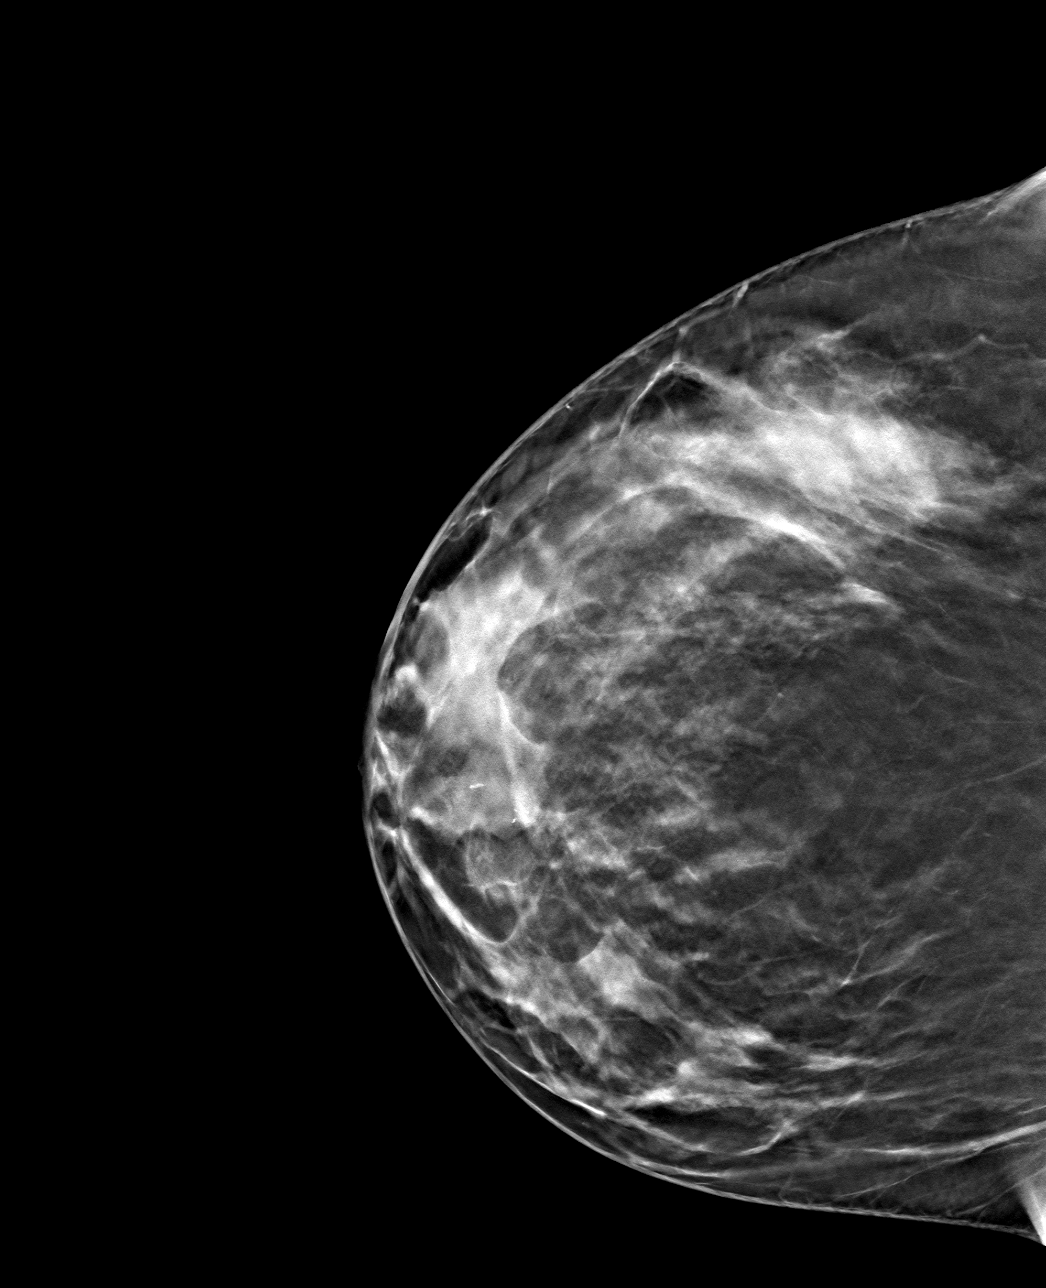

[R ML tomo · tomo slice 37/74.0]
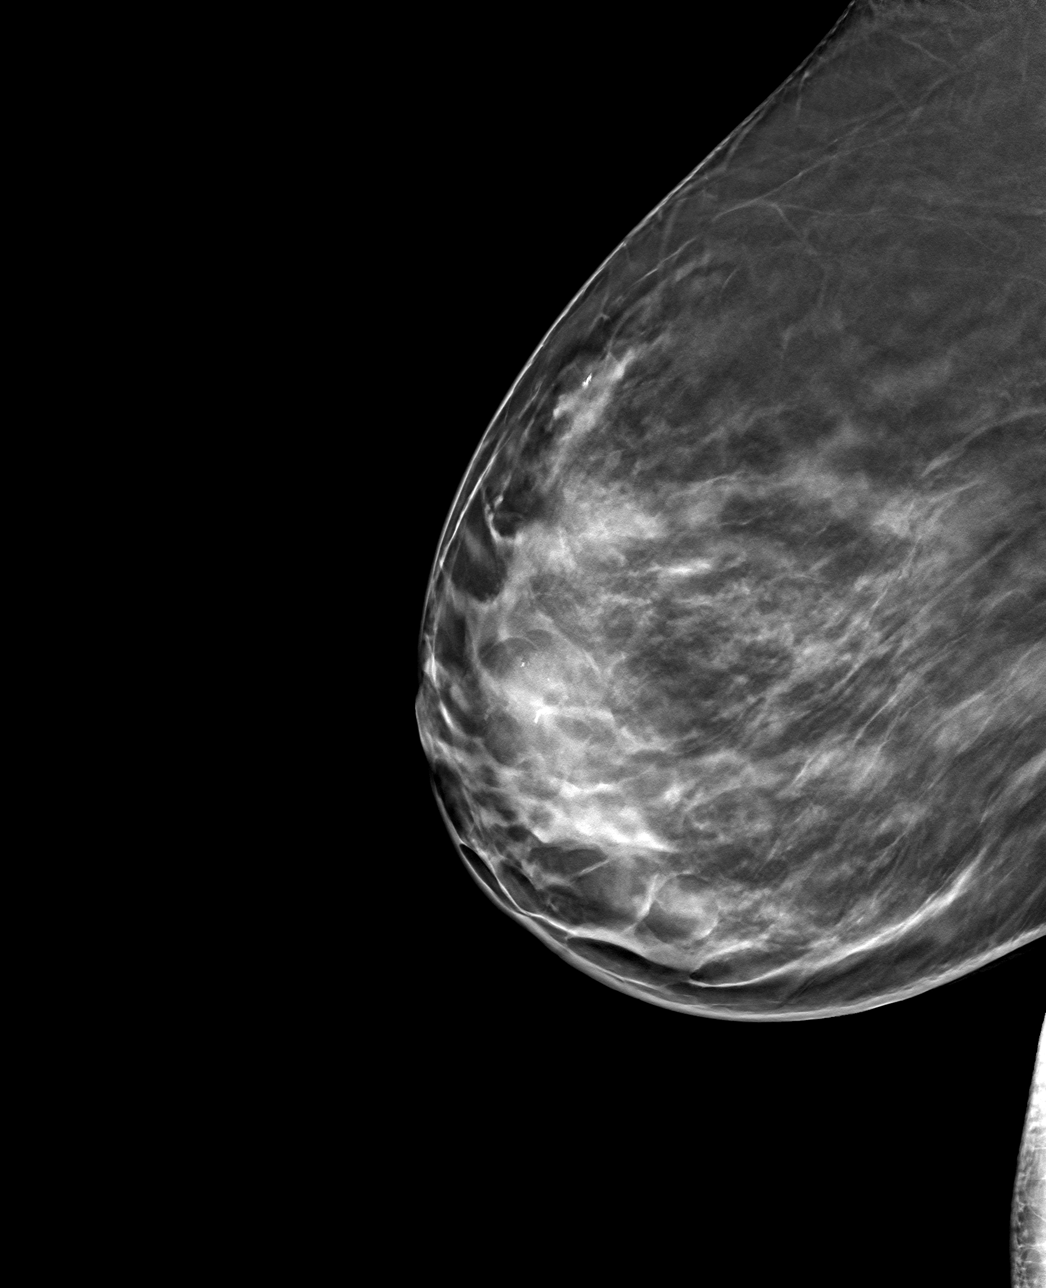

[4 of 12 positions shown; findings below may reference images not displayed]

FINDINGS: 3D Mammographic images were obtained following ultrasound guided
biopsy of a RIGHT retroareolar mass. The RIBBON biopsy marking clip
is in expected position at the site of biopsy.
IMPRESSION: Appropriate positioning of the RIBBON shaped biopsy marking clip at
the site of biopsy in the RIGHT retroareolar breast.

Final Assessment: Post Procedure Mammograms for Marker Placement

## 2022-06-16 NOTE — Patient Instructions (Signed)
Managing Anxiety, Adult After being diagnosed with anxiety, you may be relieved to know why you have felt or behaved a certain way. You may also feel overwhelmed about the treatment ahead and what it will mean for your life. With care and support, you can manage your anxiety. How to manage lifestyle changes Understanding the difference between stress and anxiety Although stress can play a role in anxiety, it is not the same as anxiety. Stress is your body's reaction to life changes and events, both good and bad. Stress is often caused by something external, such as a deadline, test, or competition. It normally goes away after the event has ended and will last just a few hours. But, stress can be ongoing and can lead to more than just stress. Anxiety is caused by something internal, such as imagining a terrible outcome or worrying that something will go wrong that will greatly upset you. Anxiety often does not go away even after the event is over, and it can become a long-term (chronic) worry. Lowering stress and anxiety Talk with your health care provider or a counselor to learn more about lowering anxiety and stress. They may suggest tension-reduction techniques, such as: Music. Spend time creating or listening to music that you enjoy and that inspires you. Mindfulness-based meditation. Practice being aware of your normal breaths while not trying to control your breathing. It can be done while sitting or walking. Centering prayer. Focus on a word, phrase, or sacred image that means something to you and brings you peace. Deep breathing. Expand your stomach and inhale slowly through your nose. Hold your breath for 3-5 seconds. Then breathe out slowly, letting your stomach muscles relax. Self-talk. Learn to notice and spot thought patterns that lead to anxiety reactions. Change those patterns to thoughts that feel peaceful. Muscle relaxation. Take time to tense muscles and then relax them. Choose a  tension-reduction technique that fits your lifestyle and personality. These techniques take time and practice. Set aside 5-15 minutes a day to do them. Specialized therapists can offer counseling and training in these techniques. The training to help with anxiety may be covered by some insurance plans. Other things you can do to manage stress and anxiety include: Keeping a stress diary. This can help you learn what triggers your reaction and then learn ways to manage your response. Thinking about how you react to certain situations. You may not be able to control everything, but you can control your response. Making time for activities that help you relax and not feeling guilty about spending your time in this way. Doing visual imagery. This involves imagining or creating mental pictures to help you relax. Practicing yoga. Through yoga poses, you can lower tension and relax.  Medicines Medicines for anxiety include: Antidepressant medicines. These are usually prescribed for long-term daily control. Anti-anxiety medicines. These may be added in severe cases, especially when panic attacks occur. When used together, medicines, psychotherapy, and tension-reduction techniques may be the most effective treatment. Relationships Relationships can play a big part in helping you recover. Spend more time connecting with trusted friends and family members. Think about going to couples counseling if you have a partner, taking family education classes, or going to family therapy. Therapy can help you and others better understand your anxiety. How to recognize changes in your anxiety Everyone responds differently to treatment for anxiety. Recovery from anxiety happens when symptoms lessen and stop interfering with your daily life at home or work. This may mean that you   will start to: Have better concentration and focus. Worry will interfere less in your daily thinking. Sleep better. Be less irritable. Have more  energy. Have improved memory. Try to recognize when your condition is getting worse. Contact your provider if your symptoms interfere with home or work and you feel like your condition is not improving. Follow these instructions at home: Activity Exercise. Adults should: Exercise for at least 150 minutes each week. The exercise should increase your heart rate and make you sweat (moderate-intensity exercise). Do strengthening exercises at least twice a week. Get the right amount and quality of sleep. Most adults need 7-9 hours of sleep each night. Lifestyle  Eat a healthy diet that includes plenty of vegetables, fruits, whole grains, low-fat dairy products, and lean protein. Do not eat a lot of foods that are high in fats, added sugars, or salt (sodium). Make choices that simplify your life. Do not use any products that contain nicotine or tobacco. These products include cigarettes, chewing tobacco, and vaping devices, such as e-cigarettes. If you need help quitting, ask your provider. Avoid caffeine, alcohol, and certain over-the-counter cold medicines. These may make you feel worse. Ask your pharmacist which medicines to avoid. General instructions Take over-the-counter and prescription medicines only as told by your provider. Keep all follow-up visits. This is to make sure you are managing your anxiety well or if you need more support. Where to find support You can get help and support from: Self-help groups. Online and community organizations. A trusted spiritual leader. Couples counseling. Family education classes. Family therapy. Where to find more information You may find that joining a support group helps you deal with your anxiety. The following sources can help you find counselors or support groups near you: Mental Health [place]: mentalhealthamerica.net Anxiety and Depression Association of America (ADAA): adaa.org National Alliance on Mental Illness (NAMI): nami.org Contact  a health care provider if: You have a hard time staying focused or finishing tasks. You spend many hours a day feeling worried about everyday life. You are very tired because you cannot stop worrying. You start to have headaches or often feel tense. You have chronic nausea or diarrhea. Get help right away if: Your heart feels like it is racing. You have shortness of breath. You have thoughts of hurting yourself or others. Get help right away if you feel like you may hurt yourself or others, or have thoughts about taking your own life. Go to your nearest emergency room or: Call 911. Call the National Suicide Prevention Lifeline at 1-800-273-8255 or 988. This is open 24 hours a day. Text the Crisis Text Line at 741741. This information is not intended to replace advice given to you by your health care provider. Make sure you discuss any questions you have with your health care provider. Document Revised: 01/02/2022 Document Reviewed: 07/17/2020 [employer] Patient Education  2023 [employer]  

## 2022-06-19 ENCOUNTER — Ambulatory Visit (INDEPENDENT_AMBULATORY_CARE_PROVIDER_SITE_OTHER): Payer: Medicaid Other | Admitting: Nurse Practitioner

## 2022-06-19 ENCOUNTER — Encounter: Payer: Self-pay | Admitting: Nurse Practitioner

## 2022-06-19 VITALS — BP 108/73 | HR 96 | Temp 98.0°F | Ht 60.0 in | Wt 244.3 lb

## 2022-06-19 DIAGNOSIS — F4323 Adjustment disorder with mixed anxiety and depressed mood: Secondary | ICD-10-CM | POA: Diagnosis not present

## 2022-06-19 MED ORDER — ESCITALOPRAM OXALATE 10 MG PO TABS: 10.0000 mg | ORAL_TABLET | Freq: Every day | ORAL | 4 refills | Status: DC
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
## 2022-06-19 NOTE — Assessment & Plan Note (Signed)
Chronic, ongoing.  Denies SI/HI.  Lexapro is offering more benefit to mood with improved scores and overall patient report of improvement.  Will increase to 10 MG as is tolerating well, but still some anxiety present.  Continue Buspar 5 MG BID. Continue therapy at this time. Could consider Trintellix in future if no benefit from SSRI or SNRI.  Return in 5 weeks for mood and pap.

## 2022-06-27 ENCOUNTER — Other Ambulatory Visit: Payer: Self-pay | Admitting: Nurse Practitioner

## 2022-06-28 NOTE — Telephone Encounter (Signed)
Unable to refill per protocol, Rx expired. Discontinued 06/19/22, dose change.  Requested Prescriptions  Pending Prescriptions Disp Refills   escitalopram (LEXAPRO) 5 MG tablet [Pharmacy Med Name: ESCITALOPRAM 5MG  TABLETS] 30 tablet 0    Sig: TAKE 1 TABLET(5 MG) BY MOUTH DAILY     Psychiatry:  Antidepressants - SSRI Passed - 06/27/2022 11:50 AM      Passed - Valid encounter within last 6 months    Recent Outpatient Visits           1 week ago Adjustment disorder with mixed anxiety and depressed mood   [place], [place] T, NP   1 month ago Adjustment disorder with mixed anxiety and depressed mood   [place], [place] T, NP   2 months ago Class 3 severe obesity without serious comorbidity with body mass index (BMI) of 40.0 to 44.9 in adult, unspecified obesity type ([place])   [place], [place] T, NP   4 months ago Acute adjustment disorder with mixed anxiety and depressed mood   [place] [facility] [place], Erin E, PA-C   6 months ago Acute adjustment disorder with mixed anxiety and depressed mood   [place], PA-C       Future Appointments             In 3 weeks Cann[place], Barbaraann Faster, NP [place], [facility]

## 2022-07-21 NOTE — Patient Instructions (Signed)
Pap Test Why am I having this test? A Pap test, also called a Pap smear, is a screening test to check for signs of: Infection. Cancer of the cervix. The cervix is the lower part of the uterus that opens into the vagina. Changes that may be a sign that cancer is developing (precancerous changes). Women need this test on a regular basis. In general, you should have a Pap test every 3 years until you reach menopause or age 31. Women aged 30-60 may choose to have their Pap test done at the same time as an HPV (human papillomavirus) test every 5 years (instead of every 3 years). Your health care provider may recommend having Pap tests more or less often depending on your medical conditions and past Pap test results. What is being tested? Cervical cells are tested for signs of infection or abnormalities. What kind of sample is taken?  Your health care provider will collect a sample of cells from the surface of your cervix. This will be done using a small cotton swab, plastic spatula, or brush that is inserted into your vagina using a tool called a speculum. This sample is often collected during a pelvic exam, when you are lying on your back on an exam table with your feet in footrests (stirrups). In some cases, fluids (secretions) from the cervix or vagina may also be collected. How do I prepare for this test? Be aware of where you are in your menstrual cycle. If you are menstruating on the day of the test, you may be asked to reschedule. You may need to reschedule if you have a known vaginal infection on the day of the test. Follow instructions from your health care provider about: Changing or stopping your regular medicines. Some medicines can cause abnormal test results, such as vaginal medicines and tetracycline. Avoiding douching 2-3 days before or the day of the test. Tell a health care provider about: Any allergies you have. All medicines you are taking, including vitamins, herbs, eye drops,  creams, and over-the-counter medicines. Any bleeding problems you have. Any surgeries you have had. Any medical conditions you have. Whether you are pregnant or may be pregnant. How are the results reported? Your test results will be reported as either abnormal or normal. What do the results mean? A normal test result means that you do not have signs of cancer of the cervix. An abnormal result may mean that you have: Cancer. A Pap test by itself is not enough to diagnose cancer. You will have more tests done if cancer is suspected. Precancerous changes in your cervix. Inflammation of the cervix. An STI (sexually transmitted infection). A fungal infection. A parasite infection. Talk with your health care provider about what your results mean. In some cases, your health care provider may do more testing to confirm the results. Questions to ask your health care provider Ask your health care provider, or the department that is doing the test: When will my results be ready? How will I get my results? What are my treatment options? What other tests do I need? What are my next steps? Summary In general, women should have a Pap test every 3 years until they reach menopause or age 31. Your health care provider will collect a sample of cells from the surface of your cervix. This will be done using a small cotton swab, plastic spatula, or brush. In some cases, fluids (secretions) from the cervix or vagina may also be collected. This information is not   intended to replace advice given to you by your health care provider. Make sure you discuss any questions you have with your health care provider. Document Revised: 06/24/2020 Document Reviewed: 06/24/2020 Elsevier Patient Education  2023 [employer] Inc[employer]  

## 2022-07-24 ENCOUNTER — Other Ambulatory Visit (HOSPITAL_COMMUNITY)
Admission: RE | Admit: 2022-07-24 | Discharge: 2022-07-24 | Disposition: A | Discharge status: Home / Self Care | Payer: Medicaid Other | Source: Ambulatory Visit | Attending: Nurse Practitioner | Admitting: Nurse Practitioner

## 2022-07-24 ENCOUNTER — Ambulatory Visit (INDEPENDENT_AMBULATORY_CARE_PROVIDER_SITE_OTHER): Payer: Medicaid Other | Admitting: Nurse Practitioner

## 2022-07-24 ENCOUNTER — Encounter: Payer: Self-pay | Admitting: Nurse Practitioner

## 2022-07-24 VITALS — BP 107/76 | HR 78 | Temp 98.3°F | Ht 60.0 in | Wt 239.7 lb

## 2022-07-24 DIAGNOSIS — Z124 Encounter for screening for malignant neoplasm of cervix: Secondary | ICD-10-CM | POA: Insufficient documentation

## 2022-07-24 DIAGNOSIS — F4323 Adjustment disorder with mixed anxiety and depressed mood: Secondary | ICD-10-CM | POA: Diagnosis not present

## 2022-07-24 DIAGNOSIS — Z6841 Body Mass Index (BMI) 40.0 and over, adult: Secondary | ICD-10-CM | POA: Diagnosis not present

## 2022-07-24 DIAGNOSIS — M6289 Other specified disorders of muscle: Secondary | ICD-10-CM | POA: Insufficient documentation

## 2022-07-24 MED ORDER — ESCITALOPRAM OXALATE 20 MG PO TABS: 20.0000 mg | ORAL_TABLET | Freq: Every day | ORAL | 4 refills | Status: DC

## 2022-07-24 NOTE — Assessment & Plan Note (Signed)
Ongoing since birth of child 3 years ago, referral for pelvic floor PT placed.

## 2022-07-24 NOTE — Assessment & Plan Note (Signed)
BMI 46.81.  Recommended eating smaller high protein, low fat meals more frequently and exercising 30 mins a day 5 times a week with a goal of 10-15lb weight loss in the next 3 months. Patient voiced their understanding and motivation to adhere to these recommendations.

## 2022-07-24 NOTE — Progress Notes (Signed)
BP 107/76   Pulse 78   Temp 98.3 F (36.8 C) (Oral)   Ht 5' (1.524 m)   Wt 239 lb 11.2 oz (108.7 kg)   SpO2 98%   BMI 46.81 kg/m    Subjective:    Patient ID: Julia Blanchard, female    DOB: 12-20-1990, 32 y.o.   MRN: [id number]  HPI: Julia Blanchard is a 32 y.o. female  Chief Complaint  Patient presents with   Mood   Gynecologic Exam   Presents for pap today.  Would like to see someone for pelvic dysfunction as does have some urgency and dribbling -- has been ongoing since having child 3 years ago.    DEPRESSION Increase Lexapro to 10 MG at visit on 06/19/22 + continues Buspar 5 MG BID Mood status: stable Satisfied with current treatment?: yes Symptom severity: moderate  Duration of current treatment : chronic Side effects: no Medication compliance: good compliance Psychotherapy/counseling: no Depressed mood: yes Anxious mood: yes Anhedonia: no Significant weight loss or gain: no Insomnia: no Fatigue: no Feelings of worthlessness or guilt: yes Impaired concentration/indecisiveness: yes Suicidal ideations: no Hopelessness: no Crying spells: no    07/24/2022    4:06 PM 06/19/2022    3:59 PM 05/21/2022    3:49 PM 04/16/2022    3:41 PM 02/13/2022    3:36 PM  Depression screen PHQ 2/9  Decreased Interest Down, Depressed, Hopeless PHQ - 2 Score Altered sleeping Tired, decreased energy Change in appetite Feeling bad or failure about yourself  0 0 Trouble concentrating Moving slowly or fidgety/restless 0 0 Suicidal thoughts 0 0 0 0 0  PHQ-9 Score Difficult doing work/chores Somewhat difficult Somewhat difficult Somewhat difficult Extremely dIfficult Very difficult       07/24/2022    4:07 PM 06/19/2022    3:59 PM 05/21/2022    3:49 PM 04/16/2022    3:42 PM  GAD 7 : Generalized Anxiety Score  Nervous, Anxious, on Edge Control/stop worrying Worry too much - different things Trouble relaxing Restless Easily annoyed or irritable Afraid - awful might happen Total GAD 7 Score Anxiety Difficulty Somewhat difficult Somewhat difficult Somewhat difficult Extremely difficult   Relevant past medical, surgical, family and social history reviewed and updated as indicated. Interim medical history since our last visit reviewed. Allergies and medications reviewed and updated.  Review of Systems  Constitutional:  Negative for activity change, appetite change, diaphoresis, fatigue and fever.  Respiratory:  Negative for cough, chest tightness and shortness of breath.   Cardiovascular:  Negative for chest pain, palpitations and leg swelling.  Gastrointestinal: Negative.   Endocrine: Negative.   Neurological: Negative.   Psychiatric/Behavioral:  Negative for decreased concentration, self-injury, sleep disturbance and suicidal ideas. The patient is nervous/anxious.     Per HPI unless specifically indicated above     Objective:    BP 107/76   Pulse 78   Temp 98.3 F (36.8 C) (Oral)  Ht 5' (1.524 m)   Wt 239 lb 11.2 oz (108.7 kg)   SpO2 98%   BMI 46.81 kg/m   Wt Readings from Last 3 Encounters:  07/24/22 239 lb 11.2 oz (108.7 kg)  06/19/22 244 lb 4.8 oz (110.8 kg)  05/21/22 243 lb 14.4 oz (110.6 kg)    Physical Exam Vitals and nursing note reviewed. Exam conducted with a chaperone present.  Constitutional:      General: She is awake. She is not in acute distress.    Appearance: She is well-developed and well-groomed. She is obese. She is not ill-appearing or toxic-appearing.  HENT:     Head: Normocephalic.     Right Ear: Hearing normal.     Left Ear: Hearing normal.     Nose: Nose normal.     Mouth/Throat:     Mouth: Mucous membranes are moist.  Eyes:     General: Lids are normal.        Right eye: No discharge.        Left eye: No discharge.      Conjunctiva/sclera: Conjunctivae normal.     Pupils: Pupils are equal, round, and reactive to light.  Neck:     Thyroid: No thyromegaly.     Vascular: No carotid bruit or JVD.  Cardiovascular:     Rate and Rhythm: Normal rate and regular rhythm.     Heart sounds: Normal heart sounds. No murmur heard.    No gallop.  Pulmonary:     Effort: Pulmonary effort is normal.     Breath sounds: Normal breath sounds.  Abdominal:     General: Bowel sounds are normal. There is no distension.     Palpations: Abdomen is soft.     Tenderness: There is no abdominal tenderness.     Hernia: There is no hernia in the left inguinal area or right inguinal area.  Genitourinary:    Exam position: Lithotomy position.     Labia:        Right: No rash.        Left: No rash.      Urethra: No prolapse.     Vagina: Normal.     Cervix: Normal.     Uterus: Normal.      Adnexa: Right adnexa normal and left adnexa normal.     Rectum: Normal.     Comments: Cervix anterior, a little further back, viewed and pap obtained. Musculoskeletal:     Cervical back: Normal range of motion and neck supple.     Right lower leg: No edema.     Left lower leg: No edema.  Lymphadenopathy:     Cervical: No cervical adenopathy.  Skin:    General: Skin is warm and dry.  Neurological:     Mental Status: She is alert and oriented to person, place, and time.  Psychiatric:        Attention and Perception: Attention normal.        Mood and Affect: Mood normal.        Behavior: Behavior normal. Behavior is cooperative.        Thought Content: Thought content normal.        Judgment: Judgment normal.     Results for orders placed or performed in visit on 04/16/22  CBC with Differential/Platelet  Result Value Ref Range   WBC 8.3 3.4 - 10.8 x10E3/uL   RBC 4.34 3.77 - 5.28 x10E6/uL   Hemoglobin 12.5 11.1 - 15.9 g/dL   Hematocrit  37.1 34.0 - 46.6 %   MCV 86 79 - 97 fL   MCH 28.8 26.6 - 33.0 pg   MCHC 33.7 31.5 - 35.7 g/dL    RDW 16.1 09.6 - 04.5 %   Platelets 264 150 - 450 x10E3/uL   Neutrophils 61 Not Estab. %   Lymphs 31 Not Estab. %   Monocytes 5 Not Estab. %   Eos 2 Not Estab. %   Basos 1 Not Estab. %   Neutrophils Absolute 5.0 1.4 - 7.0 x10E3/uL   Lymphocytes Absolute 2.6 0.7 - 3.1 x10E3/uL   Monocytes Absolute 0.4 0.1 - 0.9 x10E3/uL   EOS (ABSOLUTE) 0.2 0.0 - 0.4 x10E3/uL   Basophils Absolute 0.0 0.0 - 0.2 x10E3/uL   Immature Granulocytes 0 Not Estab. %   Immature Grans (Abs) 0.0 0.0 - 0.1 x10E3/uL  Comprehensive metabolic panel  Result Value Ref Range   Glucose 100 (H) 70 - 99 mg/dL   BUN 20 6 - 20 mg/dL   Creatinine, Ser 4.09 0.57 - 1.00 mg/dL   eGFR 92 >81 XB/JYN/8.29   BUN/Creatinine Ratio 23 9 - 23   Sodium 140 134 - 144 mmol/L   Potassium 4.2 3.5 - 5.2 mmol/L   Chloride 104 96 - 106 mmol/L   CO2 20 20 - 29 mmol/L   Calcium 9.2 8.7 - 10.2 mg/dL   Total Protein 6.4 6.0 - 8.5 g/dL   Albumin 4.2 4.0 - 5.0 g/dL   Globulin, Total 2.2 1.5 - 4.5 g/dL   Albumin/Globulin Ratio 1.9 1.2 - 2.2   Bilirubin Total <0.2 0.0 - 1.2 mg/dL   Alkaline Phosphatase 82 44 - 121 IU/L   AST 15 0 - 40 IU/L   ALT 15 0 - 32 IU/L  Lipid Panel w/o Chol/HDL Ratio  Result Value Ref Range   Cholesterol, Total 160 100 - 199 mg/dL   Triglycerides 90 0 - 149 mg/dL   HDL 47 >56 mg/dL   VLDL Cholesterol Cal 17 5 - 40 mg/dL   LDL Chol Calc (NIH) 96 0 - 99 mg/dL  TSH  Result Value Ref Range   TSH 1.800 0.450 - 4.500 uIU/mL  VITAMIN D 25 Hydroxy (Vit-D Deficiency, Fractures)  Result Value Ref Range   Vit D, 25-Hydroxy 20.3 (L) 30.0 - 100.0 ng/mL  HgB A1c  Result Value Ref Range   Hgb A1c MFr Bld 5.6 4.8 - 5.6 %   Est. average glucose Bld gHb Est-mCnc 114 mg/dL  Hepatitis C antibody  Result Value Ref Range   Hep C Virus Ab Non Reactive Non Reactive      Assessment & Plan:   Problem List Items Addressed This Visit       Musculoskeletal and Integument   Pelvic floor dysfunction in female    Ongoing since  birth of child 3 years ago, referral for pelvic floor PT placed.      Relevant Orders   Ambulatory referral to Physical Therapy     Other   Adjustment disorder with mixed anxiety and depressed mood    Chronic, ongoing.  Denies SI/HI.  Lexapro is offering more benefit to mood.  Will increase to 20 MG as is tolerating well, but still some anxiety present.  Continue Buspar 5 MG BID. Continue therapy at this time. Could consider Trintellix in future if no benefit from SSRI or SNRI.  Return in 6 weeks for mood.      Class 3 severe obesity without serious comorbidity with body mass index (BMI)  of 40.0 to 44.9 in adult, unspecified obesity type - Primary    BMI 46.81.  Recommended eating smaller high protein, low fat meals more frequently and exercising 30 mins a day 5 times a week with a goal of 10-15lb weight loss in the next 3 months. Patient voiced their understanding and motivation to adhere to these recommendations.       Other Visit Diagnoses     Cervical cancer screening       Pap obtained today and sent to lab.   Relevant Orders   Cytology - PAP        Follow up plan: Return in about 6 weeks (around 09/04/2022) for MOOD.

## 2022-07-24 NOTE — Assessment & Plan Note (Signed)
Chronic, ongoing.  Denies SI/HI.  Lexapro is offering more benefit to mood.  Will increase to 20 MG as is tolerating well, but still some anxiety present.  Continue Buspar 5 MG BID. Continue therapy at this time. Could consider Trintellix in future if no benefit from SSRI or SNRI.  Return in 6 weeks for mood.

## 2022-07-27 LAB — CYTOLOGY - PAP
Comment: NEGATIVE
Diagnosis: NEGATIVE
High risk HPV: NEGATIVE

## 2022-07-27 NOTE — Progress Notes (Signed)
Contacted via MyChart   Good afternoon Julia Blanchard, your pap returned and is nice and normal.  Do not need to repeat for 5 years:)

## 2022-08-19 ENCOUNTER — Other Ambulatory Visit: Payer: Self-pay | Admitting: Nurse Practitioner

## 2022-08-20 NOTE — Telephone Encounter (Signed)
Requested Prescriptions  Pending Prescriptions Disp Refills   busPIRone (BUSPAR) 5 MG tablet [Pharmacy Med Name: BUSPIRONE 5MG  TABLETS] 60 tablet 3    Sig: TAKE 1 TABLET(5 MG) BY MOUTH TWICE DAILY     Psychiatry: Anxiolytics/Hypnotics - Non-controlled Passed - 08/19/2022 10:01 AM      Passed - Valid encounter within last 12 months    Recent Outpatient Visits           3 weeks ago Class 3 severe obesity without serious comorbidity with body mass index (BMI) of 40.0 to 44.9 in adult, unspecified obesity type (HCC)   [place] [facility] [place], [place] T, NP   2 months ago Adjustment disorder with mixed anxiety and depressed mood   [place] [facility] [place], [place] T, NP   3 months ago Adjustment disorder with mixed anxiety and depressed mood   [place] [facility] [place], [place] T, NP   4 months ago Class 3 severe obesity without serious comorbidity with body mass index (BMI) of 40.0 to 44.9 in adult, unspecified obesity type (HCC)   [place] [facility] [place], [place] T, NP   6 months ago Acute adjustment disorder with mixed anxiety and depressed mood   [place] [facility] M[place], Oswaldo Conroy, PA-C       Future Appointments             In 2 weeks Cannady, Dorie Rank, NP  [employer], [facility]

## 2022-08-23 ENCOUNTER — Ambulatory Visit (INDEPENDENT_AMBULATORY_CARE_PROVIDER_SITE_OTHER): Payer: Medicaid Other | Admitting: Dermatology

## 2022-08-23 VITALS — BP 130/80 | HR 86

## 2022-08-23 DIAGNOSIS — D239 Other benign neoplasm of skin, unspecified: Secondary | ICD-10-CM

## 2022-08-23 DIAGNOSIS — D235 Other benign neoplasm of skin of trunk: Secondary | ICD-10-CM

## 2022-08-23 NOTE — Patient Instructions (Signed)
Due to recent changes in healthcare laws, you may see results of your pathology and/or laboratory studies on MyChart before the doctors have had a chance to review them. We understand that in some cases there may be results that are confusing or concerning to you. Please understand that not all results are received at the same time and often the doctors may need to interpret multiple results in order to provide you with the best plan of care or course of treatment. Therefore, we ask that you please give us 2 business days to thoroughly review all your results before contacting the office for clarification. Should we see a critical lab result, you will be contacted sooner.   If You Need Anything After Your Visit  If you have any questions or concerns for your doctor, please call our main line at [phone] and press option 4 to reach your doctor's medical assistant. If no one answers, please leave a voicemail as directed and we will return your call as soon as possible. Messages left after 4 pm will be answered the following business day.   You may also send us a message via MyChart. We typically respond to MyChart messages within 1-2 business days.  For prescription refills, please ask your pharmacy to contact our office. Our fax number is [phone].  If you have an urgent issue when the clinic is closed that cannot wait until the next business day, you can page your doctor at the number below.    Please note that while we do our best to be available for urgent issues outside of office hours, we are not available 24/7.   If you have an urgent issue and are unable to reach us, you may choose to seek medical care at your doctor's office, retail clinic, urgent care center, or emergency room.  If you have a medical emergency, please immediately call 911 or go to the emergency department.  Pager Numbers  - Dr. Kowalski: [phone]  - Dr. Moye: [phone]  - Dr. Stewart:  [phone]  In the event of inclement weather, please call our main line at [phone] for an update on the status of any delays or closures.  Dermatology Medication Tips: Please keep the boxes that topical medications come in in order to help keep track of the instructions about where and how to use these. Pharmacies typically print the medication instructions only on the boxes and not directly on the medication tubes.   If your medication is too expensive, please contact our office at [phone] option 4 or send us a message through MyChart.   We are unable to tell what your co-pay for medications will be in advance as this is different depending on your insurance coverage. However, we may be able to find a substitute medication at lower cost or fill out paperwork to get insurance to cover a needed medication.   If a prior authorization is required to get your medication covered by your insurance company, please allow us 1-2 business days to complete this process.  Drug prices often vary depending on where the prescription is filled and some pharmacies may offer cheaper prices.  The website www.goodrx.com contains coupons for medications through different pharmacies. The prices here do not account for what the cost may be with help from insurance (it may be cheaper with your insurance), but the website can give you the price if you did not use any insurance.  - You can print the associated coupon and take it with   your prescription to the pharmacy.  - You may also stop by our office during regular business hours and pick up a GoodRx coupon card.  - If you need your prescription sent electronically to a different pharmacy, notify our office through [facility] MyChart or by phone at [phone] option 4.     Si Usted Necesita Algo Despus de Su Visita  Tambin puede enviarnos un mensaje a travs de MyChart. Por lo general respondemos a los mensajes de MyChart en el transcurso de 1 a 2  das hbiles.  Para renovar recetas, por favor pida a su farmacia que se ponga en contacto con nuestra oficina. Nuestro nmero de fax es el [phone].  Si tiene un asunto urgente cuando la clnica est cerrada y que no puede esperar hasta el siguiente da hbil, puede llamar/localizar a su doctor(a) al nmero que aparece a continuacin.   Por favor, tenga en cuenta que aunque hacemos todo lo posible para estar disponibles para asuntos urgentes fuera del horario de oficina, no estamos disponibles las 24 horas del da, los 7 das de la semana.   Si tiene un problema urgente y no puede comunicarse con nosotros, puede optar por buscar atencin mdica  en el consultorio de su doctor(a), en una clnica privada, en un centro de atencin urgente o en una sala de emergencias.  Si tiene una emergencia mdica, por favor llame inmediatamente al 911 o vaya a la sala de emergencias.  Nmeros de bper  - Dr. Kowalski: [phone]  - Dra. Moye: [phone]  - Dra. Stewart: [phone]  En caso de inclemencias del tiempo, por favor llame a nuestra lnea principal al [phone] para una actualizacin sobre el estado de cualquier retraso o cierre.  Consejos para la medicacin en dermatologa: Por favor, guarde las cajas en las que vienen los medicamentos de uso tpico para ayudarle a seguir las instrucciones sobre dnde y cmo usarlos. Las farmacias generalmente imprimen las instrucciones del medicamento slo en las cajas y no directamente en los tubos del medicamento.   Si su medicamento es muy caro, por favor, pngase en contacto con nuestra oficina llamando al [phone] y presione la opcin 4 o envenos un mensaje a travs de MyChart.   No podemos decirle cul ser su copago por los medicamentos por adelantado ya que esto es diferente dependiendo de la cobertura de su seguro. Sin embargo, es posible que podamos encontrar un medicamento sustituto a menor costo o llenar un formulario para que el  seguro cubra el medicamento que se considera necesario.   Si se requiere una autorizacin previa para que su compaa de seguros cubra su medicamento, por favor permtanos de 1 a 2 das hbiles para completar este proceso.  Los precios de los medicamentos varan con frecuencia dependiendo del lugar de dnde se surte la receta y alguna farmacias pueden ofrecer precios ms baratos.  El sitio web www.goodrx.com tiene cupones para medicamentos de diferentes farmacias. Los precios aqu no tienen en cuenta lo que podra costar con la ayuda del seguro (puede ser ms barato con su seguro), pero el sitio web puede darle el precio si no utiliz ningn seguro.  - Puede imprimir el cupn correspondiente y llevarlo con su receta a la farmacia.  - Tambin puede pasar por nuestra oficina durante el horario de atencin regular y recoger una tarjeta de cupones de GoodRx.  - Si necesita que su receta se enve electrnicamente a una farmacia diferente, informe a nuestra oficina a travs de MyChart de [place]   o por telfono llamando al [phone] y presione la opcin 4.  

## 2022-08-23 NOTE — Progress Notes (Signed)
   Follow-Up Visit   Subjective  Julia Blanchard is a 32 y.o. 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   The following portions of the chart were reviewed this encounter and updated as appropriate: medications, allergies, medical history  Review of Systems:  No other skin or systemic complaints except as noted in HPI or Assessment and Plan.  Objective  Well appearing patient in no apparent distress; mood and affect are within normal limits. A focused examination was performed of the following areas: the face, trunk, and extremities Relevant exam findings are noted in the Assessment and Plan.  Trunk, extremities Purple and red papules.                Assessment & Plan   Angiokeratoma Trunk, extremities  Photorejuvenation - Trunk, extremities Prior to the procedure, the patient's past medical history, medications, allergies, and the rare but potential risks and complications were reviewed with the patient and a signed consent was obtained.  Pre and post treatment care was discussed and instructions provided.  Patient tolerated the procedure well.   Wynelle Link avoidance was stressed. The patient will call with any problems, questions or concerns prior to their next appointment.   [place] BBL - 08/23/22 1400      Patient Details   Skin Type: II    Anesthestic Cream Applied: No    Photo Takes: Yes    Consent Signed: Yes      Treatment Details   Date: 08/23/22    Treatment #: 1    Area: back, arms, chest    Filter: 1st Pass      1st Pass   Location: Other    Device: 560 filter    BBL j/cm2: 20    PW Msec Sec: 20    Cooling Temp: 20    Target Temp: 20    Pulses: 306      Return if symptoms worsen or fail to improve.  Maylene Roes, CMA, am acting as scribe for Armida Sans, MD .  Documentation: I have reviewed the above documentation for accuracy and  completeness, and I agree with the above.  Armida Sans, MD

## 2022-08-24 DIAGNOSIS — R059 Cough, unspecified: Secondary | ICD-10-CM | POA: Diagnosis not present

## 2022-08-24 DIAGNOSIS — J4521 Mild intermittent asthma with (acute) exacerbation: Secondary | ICD-10-CM | POA: Diagnosis not present

## 2022-09-02 NOTE — Patient Instructions (Signed)
Be Involved in Your Health Care:  Taking Medications When medications are taken as directed, they can greatly improve your health. But if they are not taken as instructed, they may not work. In some cases, not taking them correctly can be harmful. To help ensure your treatment remains effective and safe, understand your medications and how to take them.  Your lab results, notes and after visit summary will be available on My Chart. We strongly encourage you to use this feature. If lab results are abnormal the clinic will contact you with the appropriate steps. If the clinic does not contact you assume the results are satisfactory. You can always see your results on My Chart. If you have questions regarding your condition, please contact the clinic during office hours. You can also ask questions on My Chart.  We at [facility] are grateful that you chose [place] to provide care. We strive to provide excellent and compassionate care and are always looking for feedback. If you get a survey from the clinic please complete this.   Managing Depression, Adult Depression is a mental health condition that affects your thoughts, feelings, and actions. Being diagnosed with depression can bring you relief if you did not know why you have felt or behaved a certain way. It could also leave you feeling overwhelmed. Finding ways to manage your symptoms can help you feel more positive about your future. How to manage lifestyle changes Being depressed is difficult. Depression can increase the level of everyday stress. Stress can make depression symptoms worse. You may believe your symptoms cannot be managed or will never improve. However, there are many things you can try to help manage your symptoms. There is hope. Managing stress  Stress is your body's reaction to life changes and events, both good and bad. Stress can add to your feelings of depression. Learning to manage your stress can help lessen your  feelings of depression. Try some of the following approaches to reducing your stress (stress reduction techniques): Listen to music that you enjoy and that inspires you. Try using a meditation app or take a meditation class. Develop a practice that helps you connect with your spiritual self. Walk in nature, pray, or go to a place of worship. Practice deep breathing. To do this, inhale slowly through your nose. Pause at the top of your inhale for a few seconds and then exhale slowly, letting yourself relax. Repeat this three or four times. Practice yoga to help relax and work your muscles. Choose a stress reduction technique that works for you. These techniques take time and practice to develop. Set aside 5-15 minutes a day to do them. Therapists can offer training in these techniques. Do these things to help manage stress: Keep a journal. Know your limits. Set healthy boundaries for yourself and others, such as saying "no" when you think something is too much. Pay attention to how you react to certain situations. You may not be able to control everything, but you can change your reaction. Add humor to your life by watching funny movies or shows. Make time for activities that you enjoy and that relax you. Spend less time using electronics, especially at night before bed. The light from screens can make your brain think it is time to get up rather than go to bed.  Medicines Medicines, such as antidepressants, are often a part of treatment for depression. Talk with your pharmacist or health care provider about all the medicines, supplements, and herbal products that  you take, their possible side effects, and what medicines and other products are safe to take together. Make sure to report any side effects you may have to your health care provider. Relationships Your health care provider may suggest family therapy, couples therapy, or individual therapy as part of your treatment. How to recognize  changes Everyone responds differently to treatment for depression. As you recover from depression, you may start to: Have more interest in doing activities. Feel more hopeful. Have more energy. Eat a more regular amount of food. Have better mental focus. It is important to recognize if your depression is not getting better or is getting worse. The symptoms you had in the beginning may return, such as: Feeling tired. Eating too much or too little. Sleeping too much or too little. Feeling restless, agitated, or hopeless. Trouble focusing or making decisions. Having unexplained aches and pains. Feeling irritable, angry, or aggressive. If you or your family members notice these symptoms coming back, let your health care provider know right away. Follow these instructions at home: Activity Try to get some form of exercise each day, such as walking. Try yoga, mindfulness, or other stress reduction techniques. Participate in group activities if you are able. Lifestyle Get enough sleep. Cut down on or stop using caffeine, tobacco, alcohol, and any other harmful substances. Eat a healthy diet that includes plenty of vegetables, fruits, whole grains, low-fat dairy products, and lean protein. Limit foods that are high in solid fats, added sugar, or salt (sodium). General instructions Take over-the-counter and prescription medicines only as told by your health care provider. Keep all follow-up visits. It is important for your health care provider to check on your mood, behavior, and medicines. Your health care provider may need to make changes to your treatment. Where to find support Talking to others  Friends and family members can be sources of support and guidance. Talk to trusted friends or family members about your condition. Explain your symptoms and let them know that you are working with a health care provider to treat your depression. Tell friends and family how they can  help. Finances Find mental health providers that fit with your financial situation. Talk with your health care provider if you are worried about access to food, housing, or medicine. Call your insurance company to learn about your co-pays and prescription plan. Where to find more information You can find support in your area from: Anxiety and Depression Association of America (ADAA): adaa.org Mental Health America: mentalhealthamerica.net The [employer] on Mental Illness: nami.org Contact a health care provider if: You stop taking your antidepressant medicines, and you have any of these symptoms: Nausea. Headache. Light-headedness. Chills and body aches. Not being able to sleep (insomnia). You or your friends and family think your depression is getting worse. Get help right away if: You have thoughts of hurting yourself or others. Get help right away if you feel like you may hurt yourself or others, or have thoughts about taking your own life. Go to your nearest emergency room or: Call 911. Call the National Suicide Prevention Lifeline at (226)491-7612 or 988. This is open 24 hours a day. Text the Crisis Text Line at 239-551-6425. This information is not intended to replace advice given to you by your health care provider. Make sure you discuss any questions you have with your health care provider. Document Revised: 08/01/2021 Document Reviewed: 08/01/2021 Elsevier Patient Education  2024 [employer].

## 2022-09-04 ENCOUNTER — Ambulatory Visit (INDEPENDENT_AMBULATORY_CARE_PROVIDER_SITE_OTHER): Payer: Medicaid Other | Admitting: Nurse Practitioner

## 2022-09-04 ENCOUNTER — Encounter: Payer: Self-pay | Admitting: Nurse Practitioner

## 2022-09-04 VITALS — BP 94/78 | HR 83 | Temp 97.6°F | Ht 60.0 in | Wt 237.2 lb

## 2022-09-04 DIAGNOSIS — J4521 Mild intermittent asthma with (acute) exacerbation: Secondary | ICD-10-CM

## 2022-09-04 DIAGNOSIS — Z6841 Body Mass Index (BMI) 40.0 and over, adult: Secondary | ICD-10-CM | POA: Diagnosis not present

## 2022-09-04 DIAGNOSIS — J301 Allergic rhinitis due to pollen: Secondary | ICD-10-CM

## 2022-09-04 DIAGNOSIS — F4323 Adjustment disorder with mixed anxiety and depressed mood: Secondary | ICD-10-CM

## 2022-09-04 DIAGNOSIS — J45909 Unspecified asthma, uncomplicated: Secondary | ICD-10-CM | POA: Insufficient documentation

## 2022-09-04 DIAGNOSIS — J309 Allergic rhinitis, unspecified: Secondary | ICD-10-CM | POA: Insufficient documentation

## 2022-09-04 DIAGNOSIS — J45901 Unspecified asthma with (acute) exacerbation: Secondary | ICD-10-CM | POA: Insufficient documentation

## 2022-09-04 MED ORDER — FLUTICASONE PROPIONATE 50 MCG/ACT NA SUSP: 2.0000 | Freq: Every day | NASAL | 6 refills | Status: AC

## 2022-09-04 MED ORDER — BUPROPION HCL ER (XL) 150 MG PO TB24
150.0000 mg | ORAL_TABLET | Freq: Every day | ORAL | 4 refills | Status: DC
Start: 2022-09-04 — End: 2022-11-28

## 2022-09-04 MED ORDER — ALBUTEROL SULFATE HFA 108 (90 BASE) MCG/ACT IN AERS
1.0000 | INHALATION_SPRAY | RESPIRATORY_TRACT | 4 refills | Status: AC | PRN
Start: 2022-09-04 — End: ?

## 2022-09-04 MED ORDER — LEVOCETIRIZINE DIHYDROCHLORIDE 5 MG PO TABS: 5.0000 mg | ORAL_TABLET | Freq: Every evening | ORAL | 4 refills | Status: AC

## 2022-09-04 MED ORDER — AZITHROMYCIN 250 MG PO TABS: ORAL_TABLET | ORAL | 0 refills | Status: AC

## 2022-09-04 MED ORDER — PREDNISONE 20 MG PO TABS: 40.0000 mg | ORAL_TABLET | Freq: Every day | ORAL | 0 refills | Status: AC

## 2022-09-04 NOTE — Assessment & Plan Note (Signed)
Chronic, ongoing, trigger for her asthma.  At this time will stop OTC Claritin and trial Xyzal 5 MG daily + Flonase 2 sprays each nares daily (will reduce to one spray in future).  Educated her on these medications and use.  Could consider Singulair in future if ongoing poor response, although would need to monitor mood closely with this.

## 2022-09-04 NOTE — Assessment & Plan Note (Signed)
BMI 46.32.  Recommended eating smaller high protein, low fat meals more frequently and exercising 30 mins a day 5 times a week with a goal of 10-15lb weight loss in the next 3 months. Patient voiced their understanding and motivation to adhere to these recommendations.

## 2022-09-04 NOTE — Progress Notes (Signed)
BP 94/78   Pulse 83   Temp 97.6 F (36.4 C) (Oral)   Ht 5' (1.524 m)   Wt 237 lb 3.2 oz (107.6 kg)   SpO2 98%   BMI 46.32 kg/m    Subjective:    Patient ID: Julia Blanchard, female    DOB: Dec 04, 1990, 32 y.o.   MRN: [id number]  HPI: Julia Blanchard is a 32 y.o. female  Chief Complaint  Patient presents with   Cough   Mood   Allergies    Not sure if it is Allergies, or her asthma acting up. Having hard time breathing.    ASTHMA Has had asthma since childhood, has had a cough for one month which appears to be worsening.  Out of Albuterol inhaler.  Has taken Claritin in past, but does not seem to work as well.  Did go to urgent care two weeks ago and got Prednisone.   Asthma status: exacerbated Satisfied with current treatment?: yes Albuterol/rescue inhaler frequency: daily when sick Dyspnea frequency: yes Wheezing frequency: yes Cough frequency: yes Nocturnal symptom frequency: occasional when sick Limitation of activity: no Current upper respiratory symptoms: yes Triggers: pollen Last Spirometry: unknown Failed/intolerant to following asthma meds: none Asthma meds in past: Albuterol Aerochamber/spacer use: no Visits to ER or Urgent Care in past year: yes Pneumovax: Up to Date Influenza: Up to Date   DEPRESSION Increased Lexapro to 20 MG at visit on 07/24/22 + continues Buspar 5 MG BID.  Feels Lexapro continues to offer more benefit.  In past she tried Effexor and Zoloft  -- these did not offer much benefit.  Grandfather currently passing from pancreatic cancer.   Mood status: stable Satisfied with current treatment?: yes Symptom severity: moderate  Duration of current treatment : chronic Side effects: no Medication compliance: good compliance Psychotherapy/counseling: no Depressed mood: yes Anxious mood: yes Anhedonia: yes Significant weight loss or gain: no Insomnia: hard time falling asleep on occasion Fatigue: no Feelings of worthlessness or guilt:  yes Impaired concentration/indecisiveness: yes Suicidal ideations: no Hopelessness: yes Crying spells: less frequent    09/04/2022    4:08 PM 07/24/2022    4:06 PM 06/19/2022    3:59 PM 05/21/2022    3:49 PM 04/16/2022    3:41 PM  Depression screen PHQ 2/9  Decreased Interest 1 2 1 2 3   Down, Depressed, Hopeless 1 1 1 2 1   PHQ - 2 Score 2 3 2 4 4   Altered sleeping 2 1 1 3 3   Tired, decreased energy 2 3 1 3 3   Change in appetite 2 1 1 2 2   Feeling bad or failure about yourself  1 0 0 2 2  Trouble concentrating 2 1 1 2 3   Moving slowly or fidgety/restless 0 0 0 1 1  Suicidal thoughts 0 0 0 0 0  PHQ-9 Score 11 9 6 17 18   Difficult doing work/chores Somewhat difficult Somewhat difficult Somewhat difficult Somewhat difficult Extremely dIfficult       09/04/2022    4:12 PM 09/04/2022    4:08 PM 07/24/2022    4:07 PM 06/19/2022    3:59 PM  GAD 7 : Generalized Anxiety Score  Nervous, Anxious, on Edge 1 1 1 1   Control/stop worrying 1 1 1 2   Worry too much - different things 1 1 2 2   Trouble relaxing 2 2 2 2   Restless 2 2 2 2   Easily annoyed or irritable 1  2 1   Afraid - awful might happen  1 1 1 1   Total GAD 7 Score 9  11 11   Anxiety Difficulty Somewhat difficult Somewhat difficult Somewhat difficult Somewhat difficult   Relevant past medical, surgical, family and social history reviewed and updated as indicated. Interim medical history since our last visit reviewed. Allergies and medications reviewed and updated.  Review of Systems  Constitutional:  Positive for fatigue. Negative for activity change, appetite change, diaphoresis and fever.  HENT:  Positive for congestion, postnasal drip and rhinorrhea. Negative for sinus pressure, sinus pain and sore throat.   Respiratory:  Positive for cough, shortness of breath and wheezing. Negative for chest tightness.   Cardiovascular:  Negative for chest pain, palpitations and leg swelling.  Gastrointestinal: Negative.   Endocrine: Negative.    Neurological: Negative.   Psychiatric/Behavioral:  Positive for decreased concentration and sleep disturbance. Negative for self-injury and suicidal ideas. The patient is nervous/anxious.    Per HPI unless specifically indicated above     Objective:    BP 94/78   Pulse 83   Temp 97.6 F (36.4 C) (Oral)   Ht 5' (1.524 m)   Wt 237 lb 3.2 oz (107.6 kg)   SpO2 98%   BMI 46.32 kg/m   Wt Readings from Last 3 Encounters:  09/04/22 237 lb 3.2 oz (107.6 kg)  07/24/22 239 lb 11.2 oz (108.7 kg)  06/19/22 244 lb 4.8 oz (110.8 kg)    Physical Exam Vitals and nursing note reviewed.  Constitutional:      General: She is awake. She is not in acute distress.    Appearance: She is well-developed and well-groomed. She is obese. She is ill-appearing. She is not toxic-appearing.  HENT:     Head: Normocephalic.     Right Ear: Hearing, ear canal and external ear normal. A middle ear effusion is present.     Left Ear: Hearing, ear canal and external ear normal. A middle ear effusion is present.     Nose: Rhinorrhea present. Rhinorrhea is clear.     Right Sinus: No maxillary sinus tenderness or frontal sinus tenderness.     Left Sinus: No maxillary sinus tenderness or frontal sinus tenderness.     Mouth/Throat:     Mouth: Mucous membranes are moist.     Pharynx: Posterior oropharyngeal erythema (mild cobblestone pattern) present. No pharyngeal swelling or oropharyngeal exudate.     Tonsils: 1+ on the right. 1+ on the left.  Eyes:     General: Lids are normal.        Right eye: No discharge.        Left eye: No discharge.     Conjunctiva/sclera: Conjunctivae normal.     Pupils: Pupils are equal, round, and reactive to light.  Neck:     Thyroid: No thyromegaly.     Vascular: No carotid bruit.  Cardiovascular:     Rate and Rhythm: Normal rate and regular rhythm.     Heart sounds: Normal heart sounds. No murmur heard.    No gallop.  Pulmonary:     Effort: Pulmonary effort is normal. No  accessory muscle usage or respiratory distress.     Breath sounds: Wheezing present. No decreased breath sounds or rhonchi.     Comments: Slight expiratory wheezes noted throughout lung fields.   Abdominal:     General: Bowel sounds are normal. There is no distension.     Palpations: Abdomen is soft.     Tenderness: There is no abdominal tenderness.  Musculoskeletal:     Cervical  back: Normal range of motion and neck supple.     Right lower leg: No edema.     Left lower leg: No edema.  Lymphadenopathy:     Cervical: No cervical adenopathy.  Skin:    General: Skin is warm and dry.  Neurological:     Mental Status: She is alert and oriented to person, place, and time.  Psychiatric:        Attention and Perception: Attention normal.        Mood and Affect: Mood normal.        Behavior: Behavior normal. Behavior is cooperative.        Thought Content: Thought content normal.        Judgment: Judgment normal.     Results for orders placed or performed in visit on 07/24/22  Cytology - PAP  Result Value Ref Range   High risk HPV Negative    Adequacy      Satisfactory for evaluation; transformation zone component PRESENT.   Diagnosis      - Negative for intraepithelial lesion or malignancy (NILM)   Comment Normal Reference Range HPV - Negative       Assessment & Plan:   Problem List Items Addressed This Visit       Respiratory   Allergic rhinitis    Chronic, ongoing, trigger for her asthma.  At this time will stop OTC Claritin and trial Xyzal 5 MG daily + Flonase 2 sprays each nares daily (will reduce to one spray in future).  Educated her on these medications and use.  Could consider Singulair in future if ongoing poor response, although would need to monitor mood closely with this.      Asthma    Chronic, under good control at baseline with Albuterol only.  Current exacerbation.  Refills sent on Albuterol, as has been out of this.  Start Zpack and Prednisone 40 Mg daily for  5 days.  Recommend use Albuterol every 6 hours while sick and then return to as needed use.  If frequent exacerbations present may need to consider maintenance inhaler in future.  Refer to allergic rhinitis plan of care for further.      Relevant Medications   albuterol (VENTOLIN HFA) 108 (90 Base) MCG/ACT inhaler   predniSONE (DELTASONE) 20 MG tablet     Other   Adjustment disorder with mixed anxiety and depressed mood    Chronic, ongoing.  Denies SI/HI.  Lexapro is offering more benefit to mood.  Will continue 20 MG daily as is tolerating well, but still some depression/anhedonia present.  Continue Buspar 5 MG BID, increase as needed.  Discussed with her trial of Wellbutrin, which may benefit her motivation and anhedonia -- educated her on medication and BLACK BOX warning. Wishes to trial this along with her current medications, will start Wellbutrin 150 MG XR dosing.  Continue therapy at this time. Could consider Trintellix in future if no benefit from SSRI or SNRI OR Rexulti.  Return in 4 weeks for mood.      Class 3 severe obesity without serious comorbidity with body mass index (BMI) of 40.0 to 44.9 in adult, unspecified obesity type (HCC) - Primary    BMI 46.32.  Recommended eating smaller high protein, low fat meals more frequently and exercising 30 mins a day 5 times a week with a goal of 10-15lb weight loss in the next 3 months. Patient voiced their understanding and motivation to adhere to these recommendations.  Follow up plan: Return in about 4 weeks (around 10/02/2022) for DEPRESSION AND ANXIETY + ASTHMA.

## 2022-09-04 NOTE — Assessment & Plan Note (Signed)
Chronic, under good control at baseline with Albuterol only.  Current exacerbation.  Refills sent on Albuterol, as has been out of this.  Start Zpack and Prednisone 40 Mg daily for 5 days.  Recommend use Albuterol every 6 hours while sick and then return to as needed use.  If frequent exacerbations present may need to consider maintenance inhaler in future.  Refer to allergic rhinitis plan of care for further.

## 2022-09-04 NOTE — Assessment & Plan Note (Signed)
Chronic, ongoing.  Denies SI/HI.  Lexapro is offering more benefit to mood.  Will continue 20 MG daily as is tolerating well, but still some depression/anhedonia present.  Continue Buspar 5 MG BID, increase as needed.  Discussed with her trial of Wellbutrin, which may benefit her motivation and anhedonia -- educated her on medication and BLACK BOX warning. Wishes to trial this along with her current medications, will start Wellbutrin 150 MG XR dosing.  Continue therapy at this time. Could consider Trintellix in future if no benefit from SSRI or SNRI OR Rexulti.  Return in 4 weeks for mood.

## 2022-09-07 ENCOUNTER — Encounter: Payer: Self-pay | Admitting: Dermatology

## 2022-09-20 ENCOUNTER — Ambulatory Visit: Payer: Medicaid Other | Attending: Nurse Practitioner | Admitting: Physical Therapy

## 2022-09-20 ENCOUNTER — Encounter: Payer: Self-pay | Admitting: Physical Therapy

## 2022-09-20 ENCOUNTER — Other Ambulatory Visit: Payer: Self-pay

## 2022-09-20 DIAGNOSIS — M6281 Muscle weakness (generalized): Secondary | ICD-10-CM | POA: Insufficient documentation

## 2022-09-20 DIAGNOSIS — M6289 Other specified disorders of muscle: Secondary | ICD-10-CM | POA: Insufficient documentation

## 2022-09-20 DIAGNOSIS — M62838 Other muscle spasm: Secondary | ICD-10-CM | POA: Diagnosis present

## 2022-09-20 DIAGNOSIS — R279 Unspecified lack of coordination: Secondary | ICD-10-CM | POA: Insufficient documentation

## 2022-09-20 NOTE — Therapy (Addendum)
OUTPATIENT PHYSICAL THERAPY FEMALE PELVIC EVALUATION   Patient Name: Julia Blanchard MRN: [id number] DOB:08-31-1990, 32 y.o., female Today's Date: 09/21/2022  END OF SESSION:  PT End of Session - 09/20/22 1630     Visit Number 1    Date for PT Re-Evaluation 12/13/22    Authorization Type [employer]    PT Start Time 1620    PT Stop Time 1702    PT Time Calculation (min) 42 min             Past Medical History:  Diagnosis Date   Asthma    Pt states "acts up when sick"   Gestational diabetes    Metformin   Past Surgical History:  Procedure Laterality Date   BREAST LUMPECTOMY Right 2014   right breast-benign   removal rt palm lesion     US BREAST BIOPSY ([facility] HX) Right 04/19/2021   ribbon 3:00 path pending   Patient Active Problem List   Diagnosis Date Noted   Allergic rhinitis 09/04/2022   Asthma 09/04/2022   Pelvic floor dysfunction in female 07/24/2022   Vitamin D deficiency 05/20/2022   Elevated low density lipoprotein (LDL) cholesterol level 04/14/2022   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   Hidradenitis suppurativa 12/18/2021   Class 3 severe obesity without serious comorbidity with body mass index (BMI) of 40.0 to 44.9 in adult, unspecified obesity type (HCC) 08/21/2021   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   Nexplanon in place 10/20/2019   Adjustment disorder with mixed anxiety and depressed mood 10/20/2019   History of gestational diabetes 06/24/2019    PCP: Marjie Skiff, NP   REFERRING PROVIDER: Marjie Skiff, NP   REFERRING DIAG: [phone] (ICD-10-CM) - Pelvic floor dysfunction in female   THERAPY DIAG:  Other muscle spasm  Unspecified lack of coordination  Muscle weakness (generalized)  Rationale for Evaluation and Treatment: Rehabilitation  ONSET DATE: 3 years  SUBJECTIVE:                                                                                                                                                                                            SUBJECTIVE STATEMENT: Pt has been leaking with coughing/sneezing, bending and trying to get to the bathroom.  Nocturia 3-4x Fluid intake: Yes: stay hydrated water and coffee, body armour    PAIN:  Are you having pain? No  PRECAUTIONS: None  WEIGHT BEARING RESTRICTIONS: No  FALLS:  Has patient fallen in last 6 months? No  LIVING ENVIRONMENT: Lives with: lives with their spouse, lives with their son, and step daughter come and go Lives in: House/apartment   OCCUPATION: full time inventory  PLOF: Independent  PATIENT GOALS: not have leakage  PERTINENT HISTORY:  Post partum (3 years) Sexual abuse: No not recent - very long time ago  BOWEL MOVEMENT: Pain with bowel movement: Yes if very constipated Type of bowel movement:Type (Bristol Stool Scale) small and hard or diarhhea, Frequency every 3rd day is normal for me, and Strain Yes sometimes Fully empty rectum: Yes:   Leakage: No Pads: No Fiber supplement: Yes: probiotics or flax seeds sometimes  URINATION: Pain with urination: No Fully empty bladder: Yes:   Stream: Strong Urgency: Yes:   Frequency: hourly Leakage: Urge to void, Walking to the bathroom, Coughing, Sneezing, Lifting, and Bending forward Pads: Yes: when sick and coughing other than that it is panty  INTERCOURSE: Pain with intercourse:  not usually   PREGNANCY: Vaginal deliveries 1 Tearing No   PROLAPSE: None   OBJECTIVE:   DIAGNOSTIC FINDINGS:    PATIENT SURVEYS:    PFIQ-7 = 76  COGNITION: Overall cognitive status: Within functional limits for tasks assessed     SENSATION: Light touch:  Proprioception:   MUSCLE LENGTH: Hamstrings: WFL   LUMBAR SPECIAL TESTS:  ASLR - core weakness  FUNCTIONAL TESTS:  SLS - normal  GAIT:  Comments: WFL   POSTURE: rounded shoulders, increased lumbar lordosis, increased thoracic kyphosis, and anterior pelvic tilt  PELVIC ALIGNMENT:  LUMBARAROM/PROM:  A/PROM A/PROM   eval  Flexion 75%  Extension   Right lateral flexion   Left lateral flexion   Right rotation   Left rotation    (Blank rows = not tested)  LOWER EXTREMITY ROM:  Passive ROM Right eval Left eval  Hip flexion    Hip extension    Hip abduction    Hip adduction    Hip internal rotation    Hip external rotation 75% 75%  Knee flexion    Knee extension    Ankle dorsiflexion    Ankle plantarflexion    Ankle inversion    Ankle eversion     (Blank rows = not tested)  LOWER EXTREMITY MMT:  MMT Right eval Left eval  Hip flexion 5 5  Hip extension 4 4  Hip abduction 4 4  Hip adduction 5 4  Hip internal rotation 5 5  Hip external rotation 5 5  Knee flexion    Knee extension    Ankle dorsiflexion    Ankle plantarflexion    Ankle inversion    Ankle eversion     PALPATION:   General  lumbar, gluteals tight                External Perineal Exam normal                             Internal Pelvic Floor strong but difficulty relaxing and inhale with contraction  Patient confirms identification and approves PT to assess internal pelvic floor and treatment Yes  PELVIC MMT:   MMT eval  Vaginal 4/5 x 5 reps but not fully relaxed between and inhale with contraction  Internal Anal Sphincter   External Anal Sphincter   Puborectalis   Diastasis Recti   (Blank rows = not tested)        TONE: high  PROLAPSE: no  TODAY'S TREATMENT:  DATE: 09/20/22  EVAL    PATIENT EDUCATION:  Education details: educated on plan of care Person educated: Patient Education method: Explanation Education comprehension: verbalized understanding  HOME EXERCISE PROGRAM:   ASSESSMENT:  CLINICAL IMPRESSION: Patient is a 32 y.o. female post partum vaginal delivery who was seen today for physical therapy evaluation and treatment for stress incontinence. Pt also  experiences constipation which has been chronic since prior to pregnancy.  Pt demonstrates posture and muscle tension as noted above.  Pt has impaired coordination and core weakness.  Pt will benefit from skilled PT to address all above impairments for improved bladder control and trunk stability during functional exercises.   OBJECTIVE IMPAIRMENTS: decreased coordination, decreased endurance, decreased ROM, decreased strength, increased muscle spasms, impaired flexibility, impaired tone, postural dysfunction, obesity, and pain.   ACTIVITY LIMITATIONS: bending, continence, and toileting  PARTICIPATION LIMITATIONS: community activity  PERSONAL FACTORS: Time since onset of injury/illness/exacerbation and 1-2 comorbidities: vaginal delivery  are also affecting patient's functional outcome.   REHAB POTENTIAL: Excellent  CLINICAL DECISION MAKING: Evolving/moderate complexity  EVALUATION COMPLEXITY: Moderate   GOALS: Goals reviewed with patient? Yes  SHORT TERM GOALS: Target date: 10/18/22  Ind with intitial HEP Baseline: Goal status: INITIAL  LONG TERM GOALS: Target date: 12/13/22  Pt will be independent with advanced HEP to maintain improvements made throughout therapy  Baseline:  Goal status: INITIAL  2.  Pt will be able to functional actions such as coughing, sneezing without leakage  Baseline:  Goal status: INITIAL  3.  Pt will demonstrate 5/5 bil hip strength for improved posture during functional activities Baseline:  Goal status: INITIAL  4.  Pt will have complete bowel movement every other day due to improved pelvic floor coordination Baseline:  Goal status: INITIAL 5.  Pt will have nocturia reduced to 1/night at most Baseline:  Goal status: INITIAL  PLAN:  PT FREQUENCY: 1x/week  PT DURATION: 12 weeks  PLANNED INTERVENTIONS: Therapeutic exercises, Therapeutic activity, Neuromuscular re-education, Balance training, Gait training, Patient/Family education, Self  Care, Joint mobilization, Dry Needling, Electrical stimulation, Cryotherapy, Moist heat, Taping, Biofeedback, Manual therapy, and Re-evaluation  PLAN FOR NEXT SESSION: work on coordination of exhale and transversus abdominus activtaion, stretches for glutes and lumbar   [employer], PT 09/21/2022, 3:46 PM   PHYSICAL THERAPY DISCHARGE SUMMARY  Visits from Start of Care: 1  Current functional level related to goals / functional outcomes: Not met   Remaining deficits: See above   Education / Equipment: HEP   Patient agrees to discharge. Patient goals were not met. Patient is being discharged due to not returning since the last visit.  Julio Alm, PT, DPT08/14/244:21 PM

## 2022-09-27 ENCOUNTER — Ambulatory Visit: Payer: Medicaid Other | Admitting: Physical Therapy

## 2022-10-01 ENCOUNTER — Encounter: Payer: Self-pay | Admitting: Physical Therapy

## 2022-10-01 ENCOUNTER — Ambulatory Visit: Payer: Medicaid Other | Admitting: Physical Therapy

## 2022-10-01 DIAGNOSIS — R279 Unspecified lack of coordination: Secondary | ICD-10-CM

## 2022-10-01 DIAGNOSIS — M62838 Other muscle spasm: Secondary | ICD-10-CM | POA: Diagnosis not present

## 2022-10-01 DIAGNOSIS — M6281 Muscle weakness (generalized): Secondary | ICD-10-CM

## 2022-10-01 NOTE — Therapy (Signed)
OUTPATIENT PHYSICAL THERAPY FEMALE PELVIC Treatment   Patient Name: Julia Blanchard MRN: [id number] DOB:[birth year], 32 y.o., female Today's Date: 10/01/2022  END OF SESSION:  PT End of Session - 10/01/22 1555     Visit Number 2    Date for PT Re-Evaluation 12/13/22    Authorization Type [employer] no auth required    PT Start Time 1534    PT Stop Time 1615    PT Time Calculation (min) 41 min    Activity Tolerance Patient tolerated treatment well    Behavior During Therapy WFL for tasks assessed/performed              Past Medical History:  Diagnosis Date   Asthma    Pt states "acts up when sick"   Gestational diabetes    Metformin   Past Surgical History:  Procedure Laterality Date   BREAST LUMPECTOMY Right 2014   right breast-benign   removal rt palm lesion     US BREAST BIOPSY ([facility] HX) Right 04/19/2021   ribbon 3:00 path pending   Patient Active Problem List   Diagnosis Date Noted   Allergic rhinitis 09/04/2022   Asthma 09/04/2022   Pelvic floor dysfunction in female 07/24/2022   Vitamin D deficiency 05/20/2022   Elevated low density lipoprotein (LDL) cholesterol level 04/14/2022   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  Rationale for Evaluation and Treatment: Rehabilitation  ONSET DATE: 3 years  SUBJECTIVE:                                                                                                                                                                                            SUBJECTIVE STATEMENT: Pt has been the same at this time;  leaking with coughing/sneezing, bending and trying to get to the bathroom.  Nocturia 3-4x Fluid intake: Yes: stay hydrated water and coffee, body armour    PAIN:  Are you having pain? No  PRECAUTIONS: None  WEIGHT BEARING RESTRICTIONS: No  FALLS:  Has patient fallen in last 6 months? No  LIVING ENVIRONMENT: Lives with: lives with their spouse, lives  with their son, and step daughter come and go Lives in: House/apartment   OCCUPATION: full time - inventory   PLOF: Independent  PATIENT GOALS: not have leakage  PERTINENT HISTORY:  Post partum (3 years) Sexual abuse: No not recent - very long time ago  BOWEL MOVEMENT: Pain with bowel movement: Yes if very constipated Type of bowel movement:Type (Bristol Stool Scale) small and hard or diarhhea, Frequency every 3rd day is normal for me, and Strain Yes sometimes Fully empty rectum: Yes:   Leakage: No Pads: No Fiber supplement: Yes: probiotics or flax seeds sometimes  URINATION: Pain with urination: No Fully empty bladder: Yes:   Stream: Strong Urgency: Yes:   Frequency: hourly Leakage: Urge to void, Walking to the bathroom, Coughing, Sneezing, Lifting, and Bending forward Pads: Yes: when sick and coughing other than that it is panty  INTERCOURSE: Pain with intercourse:  not usually   PREGNANCY: Vaginal deliveries 1 Tearing No   PROLAPSE: None   OBJECTIVE:   DIAGNOSTIC FINDINGS:    PATIENT SURVEYS:    PFIQ-7 = 76  COGNITION: Overall cognitive status: Within functional limits for tasks assessed     SENSATION: Light touch:  Proprioception:   MUSCLE LENGTH: Hamstrings: WFL   LUMBAR SPECIAL TESTS:  ASLR - core weakness  FUNCTIONAL TESTS:  SLS - normal  GAIT:  Comments: WFL   POSTURE: rounded shoulders, increased lumbar lordosis, increased thoracic kyphosis,  and anterior pelvic tilt  PELVIC ALIGNMENT:  LUMBARAROM/PROM:  A/PROM A/PROM  eval  Flexion 75%  Extension   Right lateral flexion   Left lateral flexion   Right rotation   Left rotation    (Blank rows = not tested)  LOWER EXTREMITY ROM:  Passive ROM Right eval Left eval  Hip flexion    Hip extension    Hip abduction    Hip adduction    Hip internal rotation    Hip external rotation 75% 75%  Knee flexion    Knee extension    Ankle dorsiflexion    Ankle plantarflexion    Ankle inversion    Ankle eversion     (Blank rows = not tested)  LOWER EXTREMITY MMT:  MMT Right eval Left eval  Hip flexion 5 5  Hip extension 4 4  Hip abduction 4 4  Hip adduction 5 4  Hip internal rotation 5 5  Hip external rotation 5 5  Knee flexion    Knee extension    Ankle dorsiflexion    Ankle plantarflexion    Ankle inversion    Ankle eversion     PALPATION:   General  lumbar, gluteals tight                External Perineal Exam normal                             Internal Pelvic Floor strong but difficulty relaxing and inhale with contraction  Patient confirms identification and approves PT to assess internal pelvic floor and treatment Yes  PELVIC MMT:   MMT eval  Vaginal 4/5 x 5 reps but not fully relaxed between and inhale with contraction  Internal Anal Sphincter   External Anal Sphincter   Puborectalis   Diastasis Recti   (Blank rows = not tested)        TONE: high  PROLAPSE: no  TODAY'S TREATMENT:  DATE: 10/01/22   NMRE: Quadruped - circles, cat cow to child pose Side lying rotation open book - 10x bil Quadruped UE reaches Supine breathing with activating transversus abdominus  Prone transversus abdominus activation with posterior pelvic tilt - kegel with exhale  Manual: Lumbar fascial release   PATIENT EDUCATION:  Education  details: educated on plan of care Person educated: Patient Education method: Explanation Education comprehension: verbalized understanding  HOME EXERCISE PROGRAM: Access Code: [email or web address] URL: [email or web address] Date: 10/01/2022 Prepared by: Dwana Curd  Exercises - Quadruped Cat Cow  - 1 x daily - 7 x weekly - 3 sets - 10 reps - Quadruped Rocking Slow  - 1 x daily - 7 x weekly - 3 sets - 10 reps - Quadruped Circle Weight Shifts  - 1 x daily - 7 x weekly - 3 sets - 10 reps - Prone Pelvic Floor Contraction With Pillow  - 1 x daily - 7 x weekly - 3 sets - 10 reps  ASSESSMENT:  CLINICAL IMPRESSION: Today's session pt was educated on HEP and was given initial core strength and stretches.  Initially working on coordination and exhale with engaging the transversus abdominus.  Pt will benefit from skilled PT to address all above impairments for improved bladder control and trunk stability during functional exercises.   OBJECTIVE IMPAIRMENTS: decreased coordination, decreased endurance, decreased ROM, decreased strength, increased muscle spasms, impaired flexibility, impaired tone, postural dysfunction, obesity, and pain.   ACTIVITY LIMITATIONS: bending, continence, and toileting  PARTICIPATION LIMITATIONS: community activity  PERSONAL FACTORS: Time since onset of injury/illness/exacerbation and 1-2 comorbidities: vaginal delivery  are also affecting patient's functional outcome.   REHAB POTENTIAL: Excellent  CLINICAL DECISION MAKING: Evolving/moderate complexity  EVALUATION COMPLEXITY: Moderate   GOALS: Goals reviewed with patient? Yes  SHORT TERM GOALS: Target date: 10/18/22  Ind with intitial HEP Baseline: Goal status: IN PROGRESS  LONG TERM GOALS: Target date: 12/13/22  Pt will be independent with advanced HEP to maintain improvements made throughout therapy  Baseline:  Goal status: INITIAL  2.  Pt will be able to functional actions such as  coughing, sneezing without leakage  Baseline:  Goal status: INITIAL  3.  Pt will demonstrate 5/5 bil hip strength for improved posture during functional activities Baseline:  Goal status: INITIAL  4.  Pt will have complete bowel movement every other day due to improved pelvic floor coordination Baseline:  Goal status: INITIAL 5.  Pt will have nocturia reduced to 1/night at most Baseline:  Goal status: INITIAL  PLAN:  PT FREQUENCY: 1x/week  PT DURATION: 12 weeks  PLANNED INTERVENTIONS: Therapeutic exercises, Therapeutic activity, Neuromuscular re-education, Balance training, Gait training, Patient/Family education, Self Care, Joint mobilization, Dry Needling, Electrical stimulation, Cryotherapy, Moist heat, Taping, Biofeedback, Manual therapy, and Re-evaluation  PLAN FOR NEXT SESSION: work on coordination of exhale and transversus abdominus activtaion with progress and follow up on leak with sneezing  Brayton Caves Annaliese Saez, PT 10/01/2022, 4:19 PM

## 2022-10-03 ENCOUNTER — Ambulatory Visit: Payer: Medicaid Other | Admitting: Family Medicine

## 2022-10-08 ENCOUNTER — Encounter: Payer: Self-pay | Admitting: Physical Therapy

## 2022-10-08 NOTE — Therapy (Deleted)
OUTPATIENT PHYSICAL THERAPY FEMALE PELVIC Treatment   Patient Name: Julia Blanchard MRN: [id number] DOB:[birth year], 32 y.o., female Today's Date: 10/08/2022  END OF SESSION:     Past Medical History:  Diagnosis Date   Asthma    Pt states "acts up when sick"   Gestational diabetes    Metformin   Past Surgical History:  Procedure Laterality Date   BREAST LUMPECTOMY Right 2014   right breast-benign   removal rt palm lesion     US BREAST BIOPSY ([facility] HX) Right 04/19/2021   ribbon 3:00 path pending   Patient Active Problem List   Diagnosis Date Noted   Allergic rhinitis 09/04/2022   Asthma 09/04/2022   Pelvic floor dysfunction in female 07/24/2022   Vitamin D deficiency 05/20/2022   Elevated low density lipoprotein (LDL) cholesterol level 04/14/2022   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  Rationale for Evaluation and Treatment: Rehabilitation  ONSET DATE: 3 years  SUBJECTIVE:                                                                                                                                                                                           SUBJECTIVE STATEMENT: Pt has been the same at this time;  leaking with coughing/sneezing, bending and trying to get to the bathroom.  Nocturia 3-4x Fluid intake: Yes: stay hydrated water and coffee, body armour    PAIN:  Are you having pain? No  PRECAUTIONS:  None  WEIGHT BEARING RESTRICTIONS: No  FALLS:  Has patient fallen in last 6 months? No  LIVING ENVIRONMENT: Lives with: lives with their spouse, lives with their son, and step daughter come and go Lives in: House/apartment   OCCUPATION: full time - inventory   PLOF: Independent  PATIENT GOALS: not have leakage  PERTINENT HISTORY:  Post partum (3 years) Sexual abuse: No not recent - very long time ago  BOWEL MOVEMENT: Pain with bowel movement: Yes if very constipated Type of bowel movement:Type (Bristol Stool Scale) small and hard or diarhhea, Frequency every 3rd day is normal for me, and Strain Yes sometimes Fully  empty rectum: Yes:   Leakage: No Pads: No Fiber supplement: Yes: probiotics or flax seeds sometimes  URINATION: Pain with urination: No Fully empty bladder: Yes:   Stream: Strong Urgency: Yes:   Frequency: hourly Leakage: Urge to void, Walking to the bathroom, Coughing, Sneezing, Lifting, and Bending forward Pads: Yes: when sick and coughing other than that it is panty  INTERCOURSE: Pain with intercourse:  not usually   PREGNANCY: Vaginal deliveries 1 Tearing No   PROLAPSE: None   OBJECTIVE:   DIAGNOSTIC FINDINGS:    PATIENT SURVEYS:    PFIQ-7 = 76  COGNITION: Overall cognitive status: Within functional limits for tasks assessed     SENSATION: Light touch:  Proprioception:   MUSCLE LENGTH: Hamstrings: WFL   LUMBAR SPECIAL TESTS:  ASLR - core weakness  FUNCTIONAL TESTS:  SLS - normal  GAIT:  Comments: WFL   POSTURE: rounded shoulders, increased lumbar lordosis, increased thoracic kyphosis, and anterior pelvic tilt  PELVIC ALIGNMENT:  LUMBARAROM/PROM:  A/PROM A/PROM  eval  Flexion 75%  Extension   Right lateral flexion   Left lateral flexion   Right rotation   Left rotation    (Blank rows = not tested)  LOWER EXTREMITY ROM:  Passive ROM Right eval Left eval  Hip flexion    Hip extension    Hip abduction     Hip adduction    Hip internal rotation    Hip external rotation 75% 75%  Knee flexion    Knee extension    Ankle dorsiflexion    Ankle plantarflexion    Ankle inversion    Ankle eversion     (Blank rows = not tested)  LOWER EXTREMITY MMT:  MMT Right eval Left eval  Hip flexion 5 5  Hip extension 4 4  Hip abduction 4 4  Hip adduction 5 4  Hip internal rotation 5 5  Hip external rotation 5 5  Knee flexion    Knee extension    Ankle dorsiflexion    Ankle plantarflexion    Ankle inversion    Ankle eversion     PALPATION:   General  lumbar, gluteals tight                External Perineal Exam normal                             Internal Pelvic Floor strong but difficulty relaxing and inhale with contraction  Patient confirms identification and approves PT to assess internal pelvic floor and treatment Yes  PELVIC MMT:   MMT eval  Vaginal 4/5 x 5 reps but not fully relaxed between and inhale with contraction  Internal Anal Sphincter   External Anal Sphincter   Puborectalis   Diastasis Recti   (Blank rows = not tested)        TONE: high  PROLAPSE: no  TODAY'S TREATMENT:  DATE: 10/01/22   NMRE: Quadruped - circles, cat cow to child pose Side lying rotation open book - 10x bil Quadruped UE reaches Supine breathing with activating transversus abdominus  Prone transversus abdominus activation with posterior pelvic tilt - kegel with exhale  Manual: Lumbar fascial release   PATIENT EDUCATION:  Education details: educated on plan of care Person educated: Patient Education method: Explanation Education comprehension: verbalized understanding  HOME EXERCISE PROGRAM: Access Code: [email or web address] URL: [email or web address] Date: 10/01/2022 Prepared by: Dwana Curd  Exercises - Quadruped Cat Cow  - 1 x daily - 7 x  weekly - 3 sets - 10 reps - Quadruped Rocking Slow  - 1 x daily - 7 x weekly - 3 sets - 10 reps - Quadruped Circle Weight Shifts  - 1 x daily - 7 x weekly - 3 sets - 10 reps - Prone Pelvic Floor Contraction With Pillow  - 1 x daily - 7 x weekly - 3 sets - 10 reps  ASSESSMENT:  CLINICAL IMPRESSION: Today's session pt was educated on HEP and was given initial core strength and stretches.  Initially working on coordination and exhale with engaging the transversus abdominus.  Pt will benefit from skilled PT to address all above impairments for improved bladder control and trunk stability during functional exercises.   OBJECTIVE IMPAIRMENTS: decreased coordination, decreased endurance, decreased ROM, decreased strength, increased muscle spasms, impaired flexibility, impaired tone, postural dysfunction, obesity, and pain.   ACTIVITY LIMITATIONS: bending, continence, and toileting  PARTICIPATION LIMITATIONS: community activity  PERSONAL FACTORS: Time since onset of injury/illness/exacerbation and 1-2 comorbidities: vaginal delivery  are also affecting patient's functional outcome.   REHAB POTENTIAL: Excellent  CLINICAL DECISION MAKING: Evolving/moderate complexity  EVALUATION COMPLEXITY: Moderate   GOALS: Goals reviewed with patient? Yes  SHORT TERM GOALS: Target date: 10/18/22  Ind with intitial HEP Baseline: Goal status: IN PROGRESS  LONG TERM GOALS: Target date: 12/13/22  Pt will be independent with advanced HEP to maintain improvements made throughout therapy  Baseline:  Goal status: INITIAL  2.  Pt will be able to functional actions such as coughing, sneezing without leakage  Baseline:  Goal status: INITIAL  3.  Pt will demonstrate 5/5 bil hip strength for improved posture during functional activities Baseline:  Goal status: INITIAL  4.  Pt will have complete bowel movement every other day due to improved pelvic floor coordination Baseline:  Goal status: INITIAL 5.  Pt  will have nocturia reduced to 1/night at most Baseline:  Goal status: INITIAL  PLAN:  PT FREQUENCY: 1x/week  PT DURATION: 12 weeks  PLANNED INTERVENTIONS: Therapeutic exercises, Therapeutic activity, Neuromuscular re-education, Balance training, Gait training, Patient/Family education, Self Care, Joint mobilization, Dry Needling, Electrical stimulation, Cryotherapy, Moist heat, Taping, Biofeedback, Manual therapy, and Re-evaluation  PLAN FOR NEXT SESSION: work on coordination of exhale and transversus abdominus activtaion with progress and follow up on leak with sneezing  Brayton Caves Jams Trickett, PT 10/08/2022, 9:33 AM

## 2022-11-01 ENCOUNTER — Encounter: Payer: Medicaid Other | Admitting: Physical Therapy

## 2022-11-06 ENCOUNTER — Encounter: Payer: Medicaid Other | Admitting: Physical Therapy

## 2022-11-13 ENCOUNTER — Ambulatory Visit: Payer: Medicaid Other | Admitting: Nurse Practitioner

## 2022-11-15 ENCOUNTER — Ambulatory Visit: Payer: Medicaid Other

## 2022-11-21 ENCOUNTER — Ambulatory Visit: Payer: Medicaid Other

## 2022-11-25 NOTE — Patient Instructions (Signed)
Managing Depression, Adult Depression is a mental health condition that affects your thoughts, feelings, and actions. Being diagnosed with depression can bring you relief if you did not know why you have felt or behaved a certain way. It could also leave you feeling overwhelmed. Finding ways to manage your symptoms can help you feel more positive about your future. How to manage lifestyle changes Being depressed is difficult. Depression can increase the level of everyday stress. Stress can make depression symptoms worse. You may believe your symptoms cannot be managed or will never improve. However, there are many things you can try to help manage your symptoms. There is hope. Managing stress  Stress is your body's reaction to life changes and events, both good and bad. Stress can add to your feelings of depression. Learning to manage your stress can help lessen your feelings of depression. Try some of the following approaches to reducing your stress (stress reduction techniques): Listen to music that you enjoy and that inspires you. Try using a meditation app or take a meditation class. Develop a practice that helps you connect with your spiritual self. Walk in nature, pray, or go to a place of worship. Practice deep breathing. To do this, inhale slowly through your nose. Pause at the top of your inhale for a few seconds and then exhale slowly, letting yourself relax. Repeat this three or four times. Practice yoga to help relax and work your muscles. Choose a stress reduction technique that works for you. These techniques take time and practice to develop. Set aside 5-15 minutes a day to do them. Therapists can offer training in these techniques. Do these things to help manage stress: Keep a journal. Know your limits. Set healthy boundaries for yourself and others, such as saying "no" when you think something is too much. Pay attention to how you react to certain situations. You may not be able to  control everything, but you can change your reaction. Add humor to your life by watching funny movies or shows. Make time for activities that you enjoy and that relax you. Spend less time using electronics, especially at night before bed. The light from screens can make your brain think it is time to get up rather than go to bed.  Medicines Medicines, such as antidepressants, are often a part of treatment for depression. Talk with your pharmacist or health care provider about all the medicines, supplements, and herbal products that you take, their possible side effects, and what medicines and other products are safe to take together. Make sure to report any side effects you may have to your health care provider. Relationships Your health care provider may suggest family therapy, couples therapy, or individual therapy as part of your treatment. How to recognize changes Everyone responds differently to treatment for depression. As you recover from depression, you may start to: Have more interest in doing activities. Feel more hopeful. Have more energy. Eat a more regular amount of food. Have better mental focus. It is important to recognize if your depression is not getting better or is getting worse. The symptoms you had in the beginning may return, such as: Feeling tired. Eating too much or too little. Sleeping too much or too little. Feeling restless, agitated, or hopeless. Trouble focusing or making decisions. Having unexplained aches and pains. Feeling irritable, angry, or aggressive. If you or your family members notice these symptoms coming back, let your health care provider know right away. Follow these instructions at home: Activity Try to   get some form of exercise each day, such as walking. Try yoga, mindfulness, or other stress reduction techniques. Participate in group activities if you are able. Lifestyle Get enough sleep. Cut down on or stop using caffeine, tobacco,  alcohol, and any other harmful substances. Eat a healthy diet that includes plenty of vegetables, fruits, whole grains, low-fat dairy products, and lean protein. Limit foods that are high in solid fats, added sugar, or salt (sodium). General instructions Take over-the-counter and prescription medicines only as told by your health care provider. Keep all follow-up visits. It is important for your health care provider to check on your mood, behavior, and medicines. Your health care provider may need to make changes to your treatment. Where to find support Talking to others  Friends and family members can be sources of support and guidance. Talk to trusted friends or family members about your condition. Explain your symptoms and let them know that you are working with a health care provider to treat your depression. Tell friends and family how they can help. Finances Find mental health providers that fit with your financial situation. Talk with your health care provider if you are worried about access to food, housing, or medicine. Call your insurance company to learn about your co-pays and prescription plan. Where to find more information You can find support in your area from: Anxiety and Depression Association of America (ADAA): adaa.org Mental Health America: mentalhealthamerica.net National Alliance on Mental Illness: nami.org Contact a health care provider if: You stop taking your antidepressant medicines, and you have any of these symptoms: Nausea. Headache. Light-headedness. Chills and body aches. Not being able to sleep (insomnia). You or your friends and family think your depression is getting worse. Get help right away if: You have thoughts of hurting yourself or others. Get help right away if you feel like you may hurt yourself or others, or have thoughts about taking your own life. Go to your nearest emergency room or: Call 911. Call the National Suicide Prevention Lifeline at  1-800-273-8255 or 988. This is open 24 hours a day. Text the Crisis Text Line at 741741. This information is not intended to replace advice given to you by your health care provider. Make sure you discuss any questions you have with your health care provider. Document Revised: 08/01/2021 Document Reviewed: 08/01/2021 Elsevier Patient Education  2024 Elsevier Inc.  

## 2022-11-28 ENCOUNTER — Encounter: Payer: Self-pay | Admitting: Nurse Practitioner

## 2022-11-28 ENCOUNTER — Ambulatory Visit (INDEPENDENT_AMBULATORY_CARE_PROVIDER_SITE_OTHER): Payer: Medicaid Other | Admitting: Nurse Practitioner

## 2022-11-28 VITALS — BP 110/72 | HR 80 | Temp 97.8°F | Ht 60.0 in | Wt 241.4 lb

## 2022-11-28 DIAGNOSIS — J4520 Mild intermittent asthma, uncomplicated: Secondary | ICD-10-CM

## 2022-11-28 DIAGNOSIS — F4323 Adjustment disorder with mixed anxiety and depressed mood: Secondary | ICD-10-CM

## 2022-11-28 DIAGNOSIS — Z6841 Body Mass Index (BMI) 40.0 and over, adult: Secondary | ICD-10-CM

## 2022-11-28 MED ORDER — BUPROPION HCL ER (XL) 150 MG PO TB24: 150.0000 mg | ORAL_TABLET | Freq: Every day | ORAL | 4 refills | Status: DC

## 2022-11-28 MED ORDER — WEGOVY 0.25 MG/0.5ML ~~LOC~~ SOAJ: 0.2500 mg | SUBCUTANEOUS | 0 refills | Status: DC

## 2022-11-28 MED ORDER — WEGOVY 0.5 MG/0.5ML ~~LOC~~ SOAJ: 0.5000 mg | SUBCUTANEOUS | 0 refills | Status: DC

## 2022-11-28 NOTE — Assessment & Plan Note (Signed)
Chronic, under good control at baseline with Albuterol only.  If frequent exacerbations present may need to consider maintenance inhaler in future.  Continue allergy medications as needed.

## 2022-11-28 NOTE — Assessment & Plan Note (Signed)
BMI 47.15 and interested in weight loss.  Has tried diet and exercise over past 6 months with no benefit.  Interested in injectables.  Educated on Wegovy and Zepbound + side effects and risks/benefits + BLACK BOX warning on Wegovy.  No family history of thyroid cancer (MTC, MEN 2, thyroid cell tumors) or pancreatitis. Will start Wegovy at 0.25 MG weekly, instructed on how to use, and then increase to 0.5 MG in 4 weeks.  Recommended eating smaller high protein, low fat meals more frequently and exercising 30 mins a day 5 times a week with a goal of 10-15lb weight loss in the next 3 months. Patient voiced their understanding and motivation to adhere to these recommendations.

## 2022-11-28 NOTE — Assessment & Plan Note (Signed)
Chronic, ongoing.  Denies SI/HI.  Will restart Wellbutrin XL 150 MG, which she preferred.  Continue therapy at this time. Could consider Trintellix in future if no benefit from SSRI or SNRI OR Rexulti.  Return in 7 weeks for mood.

## 2022-11-28 NOTE — Progress Notes (Signed)
BP 110/72   Pulse 80   Temp 97.8 F (36.6 C) (Oral)   Ht 5' (1.524 m)   Wt 241 lb 6.4 oz (109.5 kg)   [name] 11/28/2022   SpO2 97%   BMI 47.15 kg/m    Subjective:    Patient ID: Julia Blanchard, female    DOB: [birth year], 32 y.o.   MRN: [id number]  HPI: Julia Blanchard is a 32 y.o. female  Chief Complaint  Patient presents with   Anxiety   Depression   Asthma   ASTHMA Reports symptoms improved at this time. Asthma status: controlled Satisfied with current treatment?: yes Albuterol/rescue inhaler frequency: every other week Dyspnea frequency: no Wheezing frequency: no Cough frequency: yes Nocturnal symptom frequency: no Limitation of activity: no Current upper respiratory symptoms: yes Triggers: pollen Last Spirometry: unknown Failed/intolerant to following asthma meds: none Asthma meds in past: Albuterol Aerochamber/spacer use: no Visits to ER or Urgent Care in past year: yes Pneumovax: Up to Date Influenza: Up to Date   WEIGHT GAIN She is interested in weight loss medication.   Duration: chronic Previous attempts at weight loss: yes, dieting and exercise Complications of obesity: HLD Peak weight: 250 lbs Weight loss goal: 160 lbs Weight loss to date: none Requesting obesity pharmacotherapy: yes Current weight loss supplements/medications: no Previous weight loss supplements/meds: no Calories:  1600  DEPRESSION She has stopped taking all medications -- reports was feeling tired all the time with them.  Was taking Lexapro, Wellbutrin, and Buspar.  Just lost grandfather, pancreatic cancer.  In past she tried Effexor and Zoloft  -- these did not offer much benefit.  Job currently is getting more stressful.  Home life is better.  Wishes to restart Wellbutrin, she felt benefit from this. Mood status: uncontrolled Satisfied with current treatment?: yes Symptom severity: moderate  Duration of current treatment : chronic Side effects: no Medication  compliance: good compliance Psychotherapy/counseling: no Depressed mood: no Anxious mood: yes with work Anhedonia: occasional Significant weight loss or gain: no Insomnia: tired all the time, wakes up a lot throughout night Fatigue: no Feelings of worthlessness or guilt: no Impaired concentration/indecisiveness: only because interrupted Suicidal ideations: no Hopelessness: no Crying spells: no    11/28/2022    3:23 PM 09/04/2022    4:08 PM 07/24/2022    4:06 PM 06/19/2022    3:59 PM 05/21/2022    3:49 PM  Depression screen PHQ 2/9  Decreased Interest 2 1 2 1 2   Down, Depressed, Hopeless 1 1 1 1 2   PHQ - 2 Score 3 2 3 2 4   Altered sleeping 1 2 1 1 3   Tired, decreased energy 2 2 3 1 3   Change in appetite 1 2 1 1 2   Feeling bad or failure about yourself  1 1 0 0 2  Trouble concentrating 1 2 1 1 2   Moving slowly or fidgety/restless 0 0 0 0 1  Suicidal thoughts 0 0 0 0 0  PHQ-9 Score 9 11 9 6 17   Difficult doing work/chores Somewhat difficult Somewhat difficult Somewhat difficult Somewhat difficult Somewhat difficult       11/28/2022    3:23 PM 09/04/2022    4:12 PM 09/04/2022    4:08 PM 07/24/2022    4:07 PM  GAD 7 : Generalized Anxiety Score  Nervous, Anxious, on Edge 1 1 1 1   Control/stop worrying 2 1 1 1   Worry too much - different things 2 1 1 2   Trouble relaxing 2 2  2 2  Restless 1 2 2 2   Easily annoyed or irritable 1 1  2   Afraid - awful might happen 2 1 1 1   Total GAD 7 Score 11 9  11   Anxiety Difficulty Somewhat difficult Somewhat difficult Somewhat difficult Somewhat difficult   Relevant past medical, surgical, family and social history reviewed and updated as indicated. Interim medical history since our last visit reviewed. Allergies and medications reviewed and updated.  Review of Systems  Constitutional:  Negative for activity change, appetite change, diaphoresis, fatigue and fever.  Respiratory:  Negative for cough, chest tightness and shortness of breath.    Cardiovascular:  Negative for chest pain, palpitations and leg swelling.  Gastrointestinal: Negative.   Endocrine: Negative.   Neurological: Negative.   Psychiatric/Behavioral:  Negative for decreased concentration, self-injury, sleep disturbance and suicidal ideas. The patient is nervous/anxious.    Per HPI unless specifically indicated above     Objective:    BP 110/72   Pulse 80   Temp 97.8 F (36.6 C) (Oral)   Ht 5' (1.524 m)   Wt 241 lb 6.4 oz (109.5 kg)   [name] 11/28/2022   SpO2 97%   BMI 47.15 kg/m   Wt Readings from Last 3 Encounters:  11/28/22 241 lb 6.4 oz (109.5 kg)  09/04/22 237 lb 3.2 oz (107.6 kg)  07/24/22 239 lb 11.2 oz (108.7 kg)    Physical Exam Vitals and nursing note reviewed.  Constitutional:      General: She is awake. She is not in acute distress.    Appearance: She is well-developed and well-groomed. She is obese. She is not ill-appearing or toxic-appearing.  HENT:     Head: Normocephalic.     Right Ear: Hearing normal.     Left Ear: Hearing normal.     Nose: Nose normal.     Mouth/Throat:     Mouth: Mucous membranes are moist.  Eyes:     General: Lids are normal.        Right eye: No discharge.        Left eye: No discharge.     Conjunctiva/sclera: Conjunctivae normal.     Pupils: Pupils are equal, round, and reactive to light.  Neck:     Thyroid: No thyromegaly.     Vascular: No carotid bruit or JVD.  Cardiovascular:     Rate and Rhythm: Normal rate and regular rhythm.     Heart sounds: Normal heart sounds. No murmur heard.    No gallop.  Pulmonary:     Effort: Pulmonary effort is normal.     Breath sounds: Normal breath sounds.  Abdominal:     General: Bowel sounds are normal. There is no distension.     Palpations: Abdomen is soft.     Tenderness: There is no abdominal tenderness.  Musculoskeletal:     Cervical back: Normal range of motion and neck supple.     Right lower leg: No edema.     Left lower leg: No edema.   Lymphadenopathy:     Cervical: No cervical adenopathy.  Skin:    General: Skin is warm and dry.  Neurological:     Mental Status: She is alert and oriented to person, place, and time.  Psychiatric:        Attention and Perception: Attention normal.        Mood and Affect: Mood normal.        Behavior: Behavior normal. Behavior is cooperative.  Thought Content: Thought content normal.        Judgment: Judgment normal.     Results for orders placed or performed in visit on 07/24/22  Cytology - PAP  Result Value Ref Range   High risk HPV Negative    Adequacy      Satisfactory for evaluation; transformation zone component PRESENT.   Diagnosis      - Negative for intraepithelial lesion or malignancy (NILM)   Comment Normal Reference Range HPV - Negative       Assessment & Plan:   Problem List Items Addressed This Visit       Respiratory   Asthma    Chronic, under good control at baseline with Albuterol only.  If frequent exacerbations present may need to consider maintenance inhaler in future.  Continue allergy medications as needed.        Other   Adjustment disorder with mixed anxiety and depressed mood    Chronic, ongoing.  Denies SI/HI.  Will restart Wellbutrin XL 150 MG, which she preferred.  Continue therapy at this time. Could consider Trintellix in future if no benefit from SSRI or SNRI OR Rexulti.  Return in 7 weeks for mood.      Class 3 severe obesity without serious comorbidity with body mass index (BMI) of 40.0 to 44.9 in adult, unspecified obesity type (HCC) - Primary    BMI 47.15 and interested in weight loss.  Has tried diet and exercise over past 6 months with no benefit.  Interested in injectables.  Educated on Wegovy and Zepbound + side effects and risks/benefits + BLACK BOX warning on Wegovy.  No family history of thyroid cancer (MTC, MEN 2, thyroid cell tumors) or pancreatitis. Will start Wegovy at 0.25 MG weekly, instructed on how to use, and then  increase to 0.5 MG in 4 weeks.  Recommended eating smaller high protein, low fat meals more frequently and exercising 30 mins a day 5 times a week with a goal of 10-15lb weight loss in the next 3 months. Patient voiced their understanding and motivation to adhere to these recommendations.       Relevant Medications   Semaglutide-Weight Management (WEGOVY) 0.25 MG/0.5ML SOAJ   Semaglutide-Weight Management (WEGOVY) 0.5 MG/0.5ML SOAJ (Start on 12/31/2022)      Follow up plan: Return in about 7 weeks (around 01/16/2023) for WEIGHT CHECK with Wegovy.

## 2022-11-29 ENCOUNTER — Telehealth: Payer: Self-pay

## 2022-11-29 NOTE — Telephone Encounter (Signed)
PA for [facility] initiated and submitted via Cover My Meds. Key: [id number]

## 2022-12-17 ENCOUNTER — Other Ambulatory Visit: Payer: Self-pay | Admitting: Nurse Practitioner

## 2022-12-18 NOTE — Telephone Encounter (Signed)
Unable to refill per protocol, Rx expired. Discontinued 11/28/22.  Requested Prescriptions  Pending Prescriptions Disp Refills   busPIRone (BUSPAR) 5 MG tablet [Pharmacy Med Name: BUSPIRONE 5MG  TABLETS] 60 tablet 3    Sig: TAKE 1 TABLET(5 MG) BY MOUTH TWICE DAILY     Psychiatry: Anxiolytics/Hypnotics - Non-controlled Passed - 12/17/2022  6:21 AM      Passed - Valid encounter within last 12 months    Recent Outpatient Visits           2 weeks ago Class 3 severe obesity without serious comorbidity with body mass index (BMI) of 40.0 to 44.9 in adult, unspecified obesity type (HCC)   [place] [facility] [place], [place] T, NP   3 months ago Class 3 severe obesity without serious comorbidity with body mass index (BMI) of 40.0 to 44.9 in adult, unspecified obesity type (HCC)   [place] [facility] [place], [place] T, NP   4 months ago Class 3 severe obesity without serious comorbidity with body mass index (BMI) of 40.0 to 44.9 in adult, unspecified obesity type (HCC)   [place] [facility] [place], Kinsley T, NP   6 months ago Adjustment disorder with mixed anxiety and depressed mood   [place] [facility] [place], [place] T, NP   7 months ago Adjustment disorder with mixed anxiety and depressed mood   [place] [facility] [place], Dorie Rank, NP       Future Appointments             In 4 weeks [place], Dorie Rank, NP [place] [facility]

## 2023-01-16 ENCOUNTER — Ambulatory Visit: Payer: Medicaid Other | Admitting: Nurse Practitioner

## 2023-02-18 DIAGNOSIS — J069 Acute upper respiratory infection, unspecified: Secondary | ICD-10-CM | POA: Diagnosis not present

## 2023-02-18 DIAGNOSIS — R0981 Nasal congestion: Secondary | ICD-10-CM | POA: Diagnosis not present

## 2023-02-27 ENCOUNTER — Encounter: Payer: Self-pay | Admitting: Nurse Practitioner

## 2023-02-27 ENCOUNTER — Ambulatory Visit (INDEPENDENT_AMBULATORY_CARE_PROVIDER_SITE_OTHER): Payer: Medicaid Other | Admitting: Nurse Practitioner

## 2023-02-27 VITALS — BP 113/74 | HR 90 | Temp 97.5°F | Wt 236.6 lb

## 2023-02-27 DIAGNOSIS — J4521 Mild intermittent asthma with (acute) exacerbation: Secondary | ICD-10-CM

## 2023-02-27 DIAGNOSIS — J029 Acute pharyngitis, unspecified: Secondary | ICD-10-CM | POA: Diagnosis not present

## 2023-02-27 MED ORDER — AMOXICILLIN-POT CLAVULANATE 875-125 MG PO TABS: 1.0000 | ORAL_TABLET | Freq: Two times a day (BID) | ORAL | 0 refills | Status: AC

## 2023-02-27 MED ORDER — PREDNISONE 20 MG PO TABS: 40.0000 mg | ORAL_TABLET | Freq: Every day | ORAL | 0 refills | Status: AC

## 2023-02-27 NOTE — Assessment & Plan Note (Signed)
Acute, for four weeks with no improvement after seeing urgent care.  Will start Augmentin BID for 7 days and Prednisone 40 MG daily for 5 days.  Continue to use Albuterol as needed + take Xyzal and Flonase daily.  Strep test negative.  Recommend: - Increased rest - Increasing Fluids - Acetaminophen as needed for fever/pain.  - Salt water gargling, chloraseptic spray and throat lozenges - Mucinex.  - Humidifying the air.  Return if worsening or ongoing symptoms present.

## 2023-02-27 NOTE — Patient Instructions (Signed)

## 2023-02-27 NOTE — Progress Notes (Signed)
BP 113/74   Pulse 90   Temp (!) 97.5 F (36.4 C) (Oral)   Wt 236 lb 9.6 oz (107.3 kg)   SpO2 97%   Breastfeeding No   BMI 46.21 kg/m    Subjective:    Patient ID: Julia Blanchard, female    DOB: 05/13/1990, 32 y.o.   MRN: [id number]  HPI: Julia Blanchard is a 32 y.o. female  Chief Complaint  Patient presents with   URI    Patient states she has been having a sore throat, congestion, and chest heaviness. Patient states he has been having the symptoms off and on for the last month. States she went to urgent care 02/18/23, given tessalon pearles and hydroxizine. Patient states that these did not work for her.    UPPER RESPIRATORY TRACT INFECTION Ongoing symptoms for one month, waxing and waning of sore throat + cough and chest heaviness.  Has underlying asthma.  Went to urgent care on 02/18/23 and was given Tessalon and Atarax.  Flu and Covid were negative at urgent care. Fever: no Cough: yes productive Shortness of breath: yes Wheezing: yes Chest pain: no Chest tightness: yes Chest congestion: yes Nasal congestion: yes Runny nose: yes Post nasal drip: yes Sneezing: no Sore throat: yes Swollen glands: no Sinus pressure: yes Headache: yes Face pain: no Toothache: no Ear pain: yes left Ear pressure: yes bilateral Eyes red/itching:no Eye drainage/crusting: no  Vomiting: with heavy coughing only Rash: no Fatigue: yes Sick contacts: yes Strep contacts: no  Context: fluctuating Recurrent sinusitis: no Relief with OTC cold/cough medications: no  Treatments attempted: Tessalon, Atarax, Dayquil, Nyquil    Relevant past medical, surgical, family and social history reviewed and updated as indicated. Interim medical history since our last visit reviewed. Allergies and medications reviewed and updated.  Review of Systems  Constitutional:  Positive for fatigue. Negative for activity change, appetite change, chills and fever.  HENT:  Positive for congestion, ear pain,  postnasal drip, rhinorrhea, sinus pressure, sinus pain and sore throat. Negative for ear discharge, facial swelling, sneezing and voice change.   Respiratory:  Positive for cough, chest tightness, shortness of breath and wheezing.   Cardiovascular:  Negative for chest pain, palpitations and leg swelling.  Gastrointestinal:  Positive for vomiting. Negative for abdominal pain, constipation, diarrhea and nausea.  Neurological:  Positive for headaches. Negative for dizziness and numbness.  Psychiatric/Behavioral: Negative.     Per HPI unless specifically indicated above     Objective:    BP 113/74   Pulse 90   Temp (!) 97.5 F (36.4 C) (Oral)   Wt 236 lb 9.6 oz (107.3 kg)   SpO2 97%   Breastfeeding No   BMI 46.21 kg/m   Wt Readings from Last 3 Encounters:  02/27/23 236 lb 9.6 oz (107.3 kg)  11/28/22 241 lb 6.4 oz (109.5 kg)  09/04/22 237 lb 3.2 oz (107.6 kg)    Physical Exam Vitals and nursing note reviewed.  Constitutional:      General: She is awake. She is not in acute distress.    Appearance: She is well-developed and well-groomed. She is obese. She is ill-appearing. She is not toxic-appearing.  HENT:     Head: Normocephalic.     Right Ear: Hearing, ear canal and external ear normal. A middle ear effusion is present. Tympanic membrane is not injected or perforated.     Left Ear: Hearing, ear canal and external ear normal. A middle ear effusion is present. Tympanic membrane is  not injected or perforated.     Nose: Rhinorrhea present. Rhinorrhea is clear.     Right Sinus: No maxillary sinus tenderness or frontal sinus tenderness.     Left Sinus: No maxillary sinus tenderness or frontal sinus tenderness.     Mouth/Throat:     Mouth: Mucous membranes are moist.     Pharynx: Posterior oropharyngeal erythema present. No pharyngeal swelling or oropharyngeal exudate.     Tonsils: No tonsillar exudate. 2+ on the right. 2+ on the left.  Eyes:     General: Lids are normal.         Right eye: No discharge.        Left eye: No discharge.     Conjunctiva/sclera: Conjunctivae normal.     Pupils: Pupils are equal, round, and reactive to light.  Neck:     Thyroid: No thyromegaly.     Vascular: No carotid bruit.  Cardiovascular:     Rate and Rhythm: Normal rate and regular rhythm.     Heart sounds: Normal heart sounds. No murmur heard.    No gallop.  Pulmonary:     Effort: Pulmonary effort is normal. No accessory muscle usage or respiratory distress.     Breath sounds: Wheezing present. No decreased breath sounds or rhonchi.     Comments: Occasional expiratory wheezes noted throughout. Abdominal:     General: Bowel sounds are normal. There is no distension.     Palpations: Abdomen is soft.     Tenderness: There is no abdominal tenderness.  Musculoskeletal:     Cervical back: Normal range of motion and neck supple.     Right lower leg: No edema.     Left lower leg: No edema.  Lymphadenopathy:     Head:     Right side of head: No submental, submandibular, tonsillar, preauricular or posterior auricular adenopathy.     Left side of head: No submental, submandibular, tonsillar, preauricular or posterior auricular adenopathy.     Cervical: No cervical adenopathy.  Skin:    General: Skin is warm and dry.  Neurological:     Mental Status: She is alert and oriented to person, place, and time.  Psychiatric:        Attention and Perception: Attention normal.        Mood and Affect: Mood normal.        Speech: Speech normal.        Behavior: Behavior normal. Behavior is cooperative.        Thought Content: Thought content normal.    Results for orders placed or performed in visit on 07/24/22  Cytology - PAP  Result Value Ref Range   High risk HPV Negative    Adequacy      Satisfactory for evaluation; transformation zone component PRESENT.   Diagnosis      - Negative for intraepithelial lesion or malignancy (NILM)   Comment Normal Reference Range HPV - Negative        Assessment & Plan:   Problem List Items Addressed This Visit       Respiratory   Asthma exacerbation - Primary    Acute, for four weeks with no improvement after seeing urgent care.  Will start Augmentin BID for 7 days and Prednisone 40 MG daily for 5 days.  Continue to use Albuterol as needed + take Xyzal and Flonase daily.  Strep test negative.  Recommend: - Increased rest - Increasing Fluids - Acetaminophen as needed for fever/pain.  - Salt water gargling,  chloraseptic spray and throat lozenges - Mucinex.  - Humidifying the air.  Return if worsening or ongoing symptoms present.      Relevant Medications   predniSONE (DELTASONE) 20 MG tablet   Other Visit Diagnoses     Sore throat       Refer to asthma exacerbation plan of care.   Relevant Orders   Rapid Strep screen([facility])        Follow up plan: Return if symptoms worsen or fail to improve.

## 2023-03-02 LAB — RAPID STREP SCREEN (MED CTR MEBANE ONLY): Strep Gp A Ag, IA W/Reflex: NEGATIVE

## 2023-03-02 LAB — CULTURE, GROUP A STREP: Strep A Culture: NEGATIVE

## 2023-05-01 DIAGNOSIS — J4521 Mild intermittent asthma with (acute) exacerbation: Secondary | ICD-10-CM | POA: Diagnosis not present

## 2023-05-01 DIAGNOSIS — J101 Influenza due to other identified influenza virus with other respiratory manifestations: Secondary | ICD-10-CM | POA: Diagnosis not present

## 2023-05-01 DIAGNOSIS — Z03818 Encounter for observation for suspected exposure to other biological agents ruled out: Secondary | ICD-10-CM | POA: Diagnosis not present

## 2023-05-01 DIAGNOSIS — R051 Acute cough: Secondary | ICD-10-CM | POA: Diagnosis not present

## 2024-03-10 ENCOUNTER — Encounter: Payer: Self-pay | Admitting: Nurse Practitioner

## 2024-03-10 ENCOUNTER — Ambulatory Visit (INDEPENDENT_AMBULATORY_CARE_PROVIDER_SITE_OTHER): Payer: PRIVATE HEALTH INSURANCE | Admitting: Nurse Practitioner

## 2024-03-10 ENCOUNTER — Ambulatory Visit: Payer: Self-pay

## 2024-03-10 VITALS — BP 131/74 | HR 84 | Temp 98.1°F | Ht 60.0 in | Wt 235.8 lb

## 2024-03-10 DIAGNOSIS — J4521 Mild intermittent asthma with (acute) exacerbation: Secondary | ICD-10-CM | POA: Diagnosis not present

## 2024-03-10 DIAGNOSIS — R519 Headache, unspecified: Secondary | ICD-10-CM

## 2024-03-10 MED ORDER — KETOROLAC TROMETHAMINE 60 MG/2ML IM SOLN
60.0000 mg | Freq: Once | INTRAMUSCULAR | Status: AC
  Administered 2024-03-10: 60 mg via INTRAMUSCULAR

## 2024-03-10 NOTE — Telephone Encounter (Signed)
 FYI Only or Action Required?: FYI only for provider: appointment scheduled on 12/2.  Patient was last seen in primary care on 02/27/2023 by Valerio Melanie DASEN, NP.  Called Nurse Triage reporting Shortness of Breath.  Symptoms began x 2.5 weeks ago.  Interventions attempted: Prescription medications: Albuterol , prednisone  .  Symptoms are: gradually improving.  Triage Disposition: See HCP Within 4 Hours (Or PCP Triage)  Patient/caregiver understands and will follow disposition?: Yes        Copied from CRM #[id number]. Topic: Clinical - Red Word Triage >> Mar 10, 2024 11:43 AM Antony RAMAN wrote: Red Word that prompted transfer to Nurse Triage: shortness of breath wheezing and chest tightness, says asthma is getting worse and wants nebulizer Reason for Disposition  [1] MILD difficulty breathing (e.g., minimal/no SOB at rest, SOB with walking, pulse < 100) AND [2] NEW-onset or WORSE than normal  Answer Assessment - Initial Assessment Questions 1. RESPIRATORY STATUS: Describe your breathing? (e.g., wheezing, shortness of breath, unable to speak, severe coughing)       SOB, wheezing ongoing x 2.5 weeks, symptoms are better as she is taking medication prescribed in UC last week.    2. ONSET: When did this breathing problem begin?      X 2.5 week   3. PATTERN Does the difficult breathing come and go, or has it been constant since it started?      Intermittent    4. SEVERITY: How bad is your breathing? (e.g., mild, moderate, severe)      Mild    5. RECURRENT SYMPTOM: Have you had difficulty breathing before? If Yes, ask: When was the last time? and What happened that time?       Yes ongoing, each year with winter    6. CARDIAC HISTORY: Do you have any history of heart disease? (e.g., heart attack, angina, bypass surgery, angioplasty)      No    7. LUNG HISTORY: Do you have any history of lung disease?  (e.g., pulmonary embolus, asthma, emphysema)     Asthma     8. CAUSE: What do you think is causing the breathing problem?      Asthma exacerbation    9. OTHER SYMPTOMS: Do you have any other symptoms? (e.g., chest pain, cough, dizziness, fever, runny nose)  Cough, fever has resolved.    11. PREGNANCY: Is there any chance you are pregnant? When was your last menstrual period?   No    Patient called in today with complaints of shortness of breath, wheezing and chest tightness.The symptoms are getting better per the patient however, she is seeking an appt. To follow-up and would like medications prescribed, as well as a prescription for a new nebulizer machine.  She stated her asthma flares up in winter. She was seen in UC last week for similar symptoms. She was prescribed steroids and albuterol . She is also complaining of a headache in which she is taking Tylenol  for. The headache began x 3 weeks ago. Appointment scheduled for evaluation. Patient agrees with plan of care, and will call back if anything changes, or if symptoms worsen.  Protocols used: Breathing Difficulty-A-AH

## 2024-03-10 NOTE — Assessment & Plan Note (Signed)
 Ongoing x 1 week.  Symptoms are improving but needs new nebulizer machine.  Will order for patient during visit.

## 2024-03-10 NOTE — Progress Notes (Signed)
 BP 131/74 (BP Location: Left Arm, Patient Position: Sitting, Cuff Size: Large)   Pulse 84   Temp 98.1 F (36.7 C) (Oral)   Ht 5' (1.524 m)   Wt 235 lb 12.8 oz (107 kg)   [name] 02/26/2024 (Approximate)   SpO2 96%   BMI 46.05 kg/m    Subjective:    Patient ID: Julia Blanchard, female    DOB: 1990/05/18, 33 y.o.   MRN: [id number]  HPI: Julia Blanchard is a 33 y.o. female  Chief Complaint  Patient presents with   Shortness of Breath    Wheezing. Patient stated she's been dealing with wheezing and SOB since last week, and it's flaring up due to weather.   Headache    Patient stated she's had an ongoing headache for 3 weeks, and nothing is working.   SHORTNESS OF BREATH Duration: since last week.  States she doesn't feel like it isn't too bad today.  It was mostly when she was sick.  She needs a new nebulizer at home.  The pump is no longer working.  She was treated by UC for her cold and feels like symptoms are better.   HEADACHE Patient state she has had a headache for the last couple of weeks.  Has taken tylenol  which has not relieved the symptoms.  Does have some nausea at times.  No blurred vision.  Does occasionally get migraines but not as often lately.     Relevant past medical, surgical, family and social history reviewed and updated as indicated. Interim medical history since our last visit reviewed. Allergies and medications reviewed and updated.  Review of Systems  Eyes:  Negative for photophobia and visual disturbance.  Respiratory:  Positive for shortness of breath and wheezing.   Gastrointestinal:  Positive for nausea.  Neurological:  Positive for headaches.    Per HPI unless specifically indicated above     Objective:    BP 131/74 (BP Location: Left Arm, Patient Position: Sitting, Cuff Size: Large)   Pulse 84   Temp 98.1 F (36.7 C) (Oral)   Ht 5' (1.524 m)   Wt 235 lb 12.8 oz (107 kg)   [name] 02/26/2024 (Approximate)   SpO2 96%   BMI 46.05 kg/m    Wt Readings from Last 3 Encounters:  03/10/24 235 lb 12.8 oz (107 kg)  02/27/23 236 lb 9.6 oz (107.3 kg)  11/28/22 241 lb 6.4 oz (109.5 kg)    Physical Exam Vitals and nursing note reviewed.  Constitutional:      General: She is not in acute distress.    Appearance: Normal appearance. She is normal weight. She is not ill-appearing, toxic-appearing or diaphoretic.  HENT:     Head: Normocephalic.     Right Ear: External ear normal.     Left Ear: External ear normal.     Nose: Nose normal.     Mouth/Throat:     Mouth: Mucous membranes are moist.     Pharynx: Oropharynx is clear.  Eyes:     General:        Right eye: No discharge.        Left eye: No discharge.     Extraocular Movements: Extraocular movements intact.     Conjunctiva/sclera: Conjunctivae normal.     Pupils: Pupils are equal, round, and reactive to light.  Cardiovascular:     Rate and Rhythm: Normal rate and regular rhythm.     Heart sounds: No murmur heard. Pulmonary:  Effort: Pulmonary effort is normal. No respiratory distress.     Breath sounds: Normal breath sounds. No wheezing or rales.  Musculoskeletal:     Cervical back: Normal range of motion and neck supple.  Skin:    General: Skin is warm and dry.     Capillary Refill: Capillary refill takes less than 2 seconds.  Neurological:     General: No focal deficit present.     Mental Status: She is alert and oriented to person, place, and time. Mental status is at baseline.  Psychiatric:        Mood and Affect: Mood normal.        Behavior: Behavior normal.        Thought Content: Thought content normal.        Judgment: Judgment normal.     Results for orders placed or performed in visit on 02/27/23  Rapid Strep screen([facility])   Collection Time: 02/27/23 11:08 AM   Specimen: Other   Other  Result Value Ref Range   Strep Gp A Ag, IA W/Reflex Negative Negative  Culture, Group A Strep   Collection Time: 02/27/23 11:08 AM   Other   Result Value Ref Range   Strep A Culture Negative       Assessment & Plan:   Problem List Items Addressed This Visit       Respiratory   Asthma exacerbation   Ongoing x 1 week.  Symptoms are improving but needs new nebulizer machine.  Will order for patient during visit.      Other Visit Diagnoses       Acute nonintractable headache, unspecified headache type    -  Primary   Will treat with Toradol .  Return to clinic if symptoms return or fail to improve.   Relevant Medications   ketorolac  (TORADOL ) injection 60 mg        Follow up plan: No follow-ups on file.

## 2024-03-10 NOTE — Telephone Encounter (Signed)
 Noted, saw Darice today.

## 2024-04-11 NOTE — Patient Instructions (Signed)

## 2024-04-13 ENCOUNTER — Ambulatory Visit: Admitting: Nurse Practitioner

## 2024-04-13 ENCOUNTER — Encounter: Payer: Self-pay | Admitting: Nurse Practitioner

## 2024-04-13 VITALS — BP 124/77 | HR 77 | Temp 97.8°F | Resp 18 | Ht 60.0 in | Wt 252.2 lb

## 2024-04-13 DIAGNOSIS — E559 Vitamin D deficiency, unspecified: Secondary | ICD-10-CM

## 2024-04-13 DIAGNOSIS — Z975 Presence of (intrauterine) contraceptive device: Secondary | ICD-10-CM

## 2024-04-13 DIAGNOSIS — E66813 Obesity, class 3: Secondary | ICD-10-CM

## 2024-04-13 DIAGNOSIS — E7800 Pure hypercholesterolemia, unspecified: Secondary | ICD-10-CM | POA: Diagnosis not present

## 2024-04-13 DIAGNOSIS — Z8632 Personal history of gestational diabetes: Secondary | ICD-10-CM | POA: Diagnosis not present

## 2024-04-13 DIAGNOSIS — J4520 Mild intermittent asthma, uncomplicated: Secondary | ICD-10-CM | POA: Diagnosis not present

## 2024-04-13 DIAGNOSIS — Z6841 Body Mass Index (BMI) 40.0 and over, adult: Secondary | ICD-10-CM

## 2024-04-13 DIAGNOSIS — F4323 Adjustment disorder with mixed anxiety and depressed mood: Secondary | ICD-10-CM

## 2024-04-13 NOTE — Assessment & Plan Note (Addendum)
 BMI 49.25 and interested in weight loss.  Has tried diet and exercise over past 6 months with no benefit.  Interested in injectables.  Educated on Wegovy  and Zepbound + side effects and risks/benefits + BLACK BOX warning on Wegovy .  No family history of thyroid cancer (MTC, MEN 2, thyroid cell tumors) or pancreatitis. She will look into coverage with her insurance and if covered will ordered. Also discussed with her the FDA recently approved an oral version of GLP-1 for weight loss. Recommended eating smaller high protein, low fat meals more frequently and exercising 30 mins a day 5 times a week with a goal of 10-15lb weight loss in the next 3 months. Patient voiced their understanding and motivation to adhere to these recommendations. - Educated on various medication available.

## 2024-04-13 NOTE — Assessment & Plan Note (Signed)
 Referral to GYN placed to have removed.

## 2024-04-13 NOTE — Progress Notes (Signed)
 "  BP 124/77 (BP Location: Left Arm, Patient Position: Sitting, Cuff Size: Normal)   Pulse 77   Temp 97.8 F (36.6 C) (Oral)   Resp 18   Ht 5' (1.524 m)   Wt 252 lb 3.2 oz (114.4 kg)   SpO2 98%   BMI 49.25 kg/m    Subjective:    Patient ID: Julia Blanchard, female    DOB: 05-14-1990, 34 y.o.   MRN: [id number]  HPI: Julia Blanchard is a 34 y.o. female  Chief Complaint  Patient presents with   Follow-up    Here for a regular check up. Has different insurance now and wants to discuss weight loss. Needs new nebulizer was set up to get one from a place in [place] but they did not accept her insurance. Needs a place that will work with her insurance had her machine is on its last legs.   Other    Has nexplanon  in that expired in June. Would like to talk about getting fixed would like to know if she would need a referral for you. Nexplonon is currently still in. Was told it was good for about 3-4 years. Son is currently 33 years old.   Nexplanon  is past due date for removal. She is interested in tubal ligation.   ASTHMA Needs new nebulizer machine.  Asthma status: stable Satisfied with current treatment?: yes Albuterol /rescue inhaler frequency: has not used in over a week Dyspnea frequency: no Wheezing frequency: no Cough frequency: no Nocturnal symptom frequency: no Limitation of activity: no Current upper respiratory symptoms: no Triggers: sickness Home peak flows: none Aerochamber/spacer use: no Visits to ER or Urgent Care in past year: no Pneumovax: Not up to Date refuses Influenza: Not up to Date refuses  WEIGHT GAIN Was never able to get Wegovy  in the past. She has tried multiple things to lose weight, focusing on diet changes and exercise, with no benefit. History of low Vitamin D  level and gestational diabetes. Duration: chronic Previous attempts at weight loss: yes Complications of obesity: HTN Peak weight: 252 lbs Weight loss goal: 150 lbs Weight loss to  date: none Requesting obesity pharmacotherapy: yes Current weight loss supplements/medications: no Previous weight loss supplements/meds: yes Calories: 2000  DEPRESSION Took Wellbutrin  in the past.  Mood status: stable Satisfied with current treatment?: yes Symptom severity: mild  Duration of current treatment : chronic Psychotherapy/counseling: yes in the past Depressed mood: no Anxious mood: no Anhedonia: no Significant weight loss or gain: no Insomnia: yes sometimes over sleeps Fatigue: no Feelings of worthlessness or guilt: no Impaired concentration/indecisiveness: no Suicidal ideations: no Hopelessness: no Crying spells: no    03/10/2024    1:56 PM 11/28/2022    3:23 PM 09/04/2022    4:08 PM 07/24/2022    4:06 PM 06/19/2022    3:59 PM  Depression screen PHQ 2/9  Decreased Interest 1 2 1 2 1   Down, Depressed, Hopeless 1 1 1 1 1   PHQ - 2 Score 2 3 2 3 2   Altered sleeping 1 1 2 1 1   Tired, decreased energy 1 2 2 3 1   Change in appetite 1 1 2 1 1   Feeling bad or failure about yourself  1 1 1  0 0  Trouble concentrating 1 1 2 1 1   Moving slowly or fidgety/restless 0 0 0 0 0  Suicidal thoughts 0 0 0 0 0  PHQ-9 Score 7 9  11  9  6    Difficult doing work/chores Somewhat difficult Somewhat  difficult Somewhat difficult Somewhat difficult Somewhat difficult     Data saved with a previous flowsheet row definition       03/10/2024    1:56 PM 11/28/2022    3:23 PM 09/04/2022    4:12 PM 09/04/2022    4:08 PM  GAD 7 : Generalized Anxiety Score  Nervous, Anxious, on Edge 2 1 1 1   Control/stop worrying 1 2 1 1   Worry too much - different things 2 2 1 1   Trouble relaxing 1 2 2 2   Restless 1 1 2 2   Easily annoyed or irritable 1 1 1    Afraid - awful might happen 1 2 1 1   Total GAD 7 Score 9 11 9    Anxiety Difficulty Somewhat difficult Somewhat difficult Somewhat difficult Somewhat difficult   Relevant past medical, surgical, family and social history reviewed and updated as  indicated. Interim medical history since our last visit reviewed. Allergies and medications reviewed and updated.  Review of Systems  Constitutional:  Negative for activity change, appetite change, diaphoresis, fatigue and fever.  Respiratory:  Negative for cough, chest tightness and shortness of breath.   Cardiovascular:  Negative for chest pain, palpitations and leg swelling.  Gastrointestinal: Negative.   Endocrine: Negative.   Neurological: Negative.   Psychiatric/Behavioral:  Negative for decreased concentration, self-injury, sleep disturbance and suicidal ideas. The patient is not nervous/anxious.     Per HPI unless specifically indicated above     Objective:    BP 124/77 (BP Location: Left Arm, Patient Position: Sitting, Cuff Size: Normal)   Pulse 77   Temp 97.8 F (36.6 C) (Oral)   Resp 18   Ht 5' (1.524 m)   Wt 252 lb 3.2 oz (114.4 kg)   SpO2 98%   BMI 49.25 kg/m   Wt Readings from Last 3 Encounters:  04/13/24 252 lb 3.2 oz (114.4 kg)  03/10/24 235 lb 12.8 oz (107 kg)  02/27/23 236 lb 9.6 oz (107.3 kg)    Physical Exam Vitals and nursing note reviewed.  Constitutional:      General: She is awake. She is not in acute distress.    Appearance: She is well-developed and well-groomed. She is obese. She is not ill-appearing or toxic-appearing.  HENT:     Head: Normocephalic.     Right Ear: Hearing and external ear normal.     Left Ear: Hearing and external ear normal.  Eyes:     General: Lids are normal.        Right eye: No discharge.        Left eye: No discharge.     Conjunctiva/sclera: Conjunctivae normal.     Pupils: Pupils are equal, round, and reactive to light.  Neck:     Thyroid: No thyromegaly.     Vascular: No carotid bruit.  Cardiovascular:     Rate and Rhythm: Normal rate and regular rhythm.     Heart sounds: Normal heart sounds. No murmur heard.    No gallop.  Pulmonary:     Effort: Pulmonary effort is normal. No accessory muscle usage or  respiratory distress.     Breath sounds: Normal breath sounds. No decreased breath sounds, wheezing or rales.  Abdominal:     General: Bowel sounds are normal. There is no distension.     Palpations: Abdomen is soft.     Tenderness: There is no abdominal tenderness.  Musculoskeletal:     Cervical back: Normal range of motion and neck supple.  Right lower leg: No edema.     Left lower leg: No edema.  Lymphadenopathy:     Cervical: No cervical adenopathy.  Skin:    General: Skin is warm and dry.  Neurological:     Mental Status: She is alert and oriented to person, place, and time.     Deep Tendon Reflexes: Reflexes are normal and symmetric.     Reflex Scores:      Brachioradialis reflexes are 2+ on the right side and 2+ on the left side.      Patellar reflexes are 2+ on the right side and 2+ on the left side. Psychiatric:        Attention and Perception: Attention normal.        Mood and Affect: Mood normal.        Speech: Speech normal.        Behavior: Behavior normal. Behavior is cooperative.        Thought Content: Thought content normal.     Results for orders placed or performed in visit on 02/27/23  Rapid Strep screen([facility])   Collection Time: 02/27/23 11:08 AM   Specimen: Other   Other  Result Value Ref Range   Strep Gp A Ag, IA W/Reflex Negative Negative  Culture, Group A Strep   Collection Time: 02/27/23 11:08 AM   Other  Result Value Ref Range   Strep A Culture Negative       Assessment & Plan:   Problem List Items Addressed This Visit       Respiratory   Asthma   Chronic, stable. Recent exacerbation, but this has improved per patient. Continue Albuterol  as needed. Will work on getting her a new nebulizer machine.      Relevant Orders   CBC with Differential/Platelet     Other   Vitamin D  deficiency   Chronic, ongoing. Recheck level today and start supplement as needed.      Relevant Orders   VITAMIN D  25 Hydroxy (Vit-D Deficiency,  Fractures)   Nexplanon  in place   Referral to GYN placed to have removed.      Relevant Orders   Ambulatory referral to Gynecology   History of gestational diabetes   Check A1c annually, labs today.      Relevant Orders   HgB A1c   Elevated low density lipoprotein (LDL) cholesterol level   Ongoing, noted past labs.  Recheck today and continue diet and exercise focus.      Relevant Orders   Comprehensive metabolic panel with GFR   Lipid Panel w/o Chol/HDL Ratio   Class 3 severe obesity without serious comorbidity with body mass index (BMI) of 40.0 to 44.9 in adult, unspecified obesity type (HCC) - Primary   BMI 49.25 and interested in weight loss.  Has tried diet and exercise over past 6 months with no benefit.  Interested in injectables.  Educated on Wegovy  and Zepbound + side effects and risks/benefits + BLACK BOX warning on Wegovy .  No family history of thyroid cancer (MTC, MEN 2, thyroid cell tumors) or pancreatitis. She will look into coverage with her insurance and if covered will ordered. Also discussed with her the FDA recently approved an oral version of GLP-1 for weight loss. Recommended eating smaller high protein, low fat meals more frequently and exercising 30 mins a day 5 times a week with a goal of 10-15lb weight loss in the next 3 months. Patient voiced their understanding and motivation to adhere to these recommendations. - Educated on  various medication available.       Adjustment disorder with mixed anxiety and depressed mood   Chronic, ongoing.  Denies SI/HI.  Maintain off medication at this time. Could consider Trintellix in future if no benefit from SSRI or SNRI OR Rexulti.  Took Wellbutrin  in past.      Relevant Orders   TSH     Follow up plan: Return in about 2 months (around 06/11/2024) for WEIGHT GAIN.      "

## 2024-04-13 NOTE — Assessment & Plan Note (Signed)
Check A1c annually, labs today.

## 2024-04-13 NOTE — Assessment & Plan Note (Signed)
 Chronic, ongoing.  Denies SI/HI.  Maintain off medication at this time. Could consider Trintellix in future if no benefit from SSRI or SNRI OR Rexulti.  Took Wellbutrin  in past.

## 2024-04-13 NOTE — Assessment & Plan Note (Signed)
 Chronic, stable. Recent exacerbation, but this has improved per patient. Continue Albuterol  as needed. Will work on getting her a new nebulizer machine.

## 2024-04-13 NOTE — Assessment & Plan Note (Signed)
Ongoing, noted past labs.  Recheck today and continue diet and exercise focus.

## 2024-04-13 NOTE — Assessment & Plan Note (Signed)
 Chronic, ongoing. Recheck level today and start supplement as needed.

## 2024-04-14 ENCOUNTER — Ambulatory Visit: Payer: Self-pay | Admitting: Nurse Practitioner

## 2024-04-14 DIAGNOSIS — R7303 Prediabetes: Secondary | ICD-10-CM | POA: Insufficient documentation

## 2024-04-14 LAB — LIPID PANEL W/O CHOL/HDL RATIO
Cholesterol, Total: 175 mg/dL (ref 100–199)
HDL: 40 mg/dL
LDL Chol Calc (NIH): 105 mg/dL — ABNORMAL HIGH (ref 0–99)
Triglycerides: 171 mg/dL — ABNORMAL HIGH (ref 0–149)
VLDL Cholesterol Cal: 30 mg/dL (ref 5–40)

## 2024-04-14 LAB — CBC WITH DIFFERENTIAL/PLATELET
Basophils Absolute: 0.1 x10E3/uL (ref 0.0–0.2)
Basos: 1 %
EOS (ABSOLUTE): 0.3 x10E3/uL (ref 0.0–0.4)
Eos: 3 %
Hematocrit: 39.3 % (ref 34.0–46.6)
Hemoglobin: 12.9 g/dL (ref 11.1–15.9)
Immature Grans (Abs): 0 x10E3/uL (ref 0.0–0.1)
Immature Granulocytes: 0 %
Lymphocytes Absolute: 2.6 x10E3/uL (ref 0.7–3.1)
Lymphs: 26 %
MCH: 29.5 pg (ref 26.6–33.0)
MCHC: 32.8 g/dL (ref 31.5–35.7)
MCV: 90 fL (ref 79–97)
Monocytes Absolute: 0.6 x10E3/uL (ref 0.1–0.9)
Monocytes: 6 %
Neutrophils Absolute: 6.4 x10E3/uL (ref 1.4–7.0)
Neutrophils: 64 %
Platelets: 270 x10E3/uL (ref 150–450)
RBC: 4.37 x10E6/uL (ref 3.77–5.28)
RDW: 12.8 % (ref 11.7–15.4)
WBC: 10 x10E3/uL (ref 3.4–10.8)

## 2024-04-14 LAB — COMPREHENSIVE METABOLIC PANEL WITH GFR
ALT: 18 IU/L (ref 0–32)
AST: 12 IU/L (ref 0–40)
Albumin: 4 g/dL (ref 3.9–4.9)
Alkaline Phosphatase: 82 IU/L (ref 41–116)
BUN/Creatinine Ratio: 23 (ref 9–23)
BUN: 16 mg/dL (ref 6–20)
Bilirubin Total: 0.3 mg/dL (ref 0.0–1.2)
CO2: 21 mmol/L (ref 20–29)
Calcium: 9.1 mg/dL (ref 8.7–10.2)
Chloride: 102 mmol/L (ref 96–106)
Creatinine, Ser: 0.71 mg/dL (ref 0.57–1.00)
Globulin, Total: 2.6 g/dL (ref 1.5–4.5)
Glucose: 89 mg/dL (ref 70–99)
Potassium: 4.3 mmol/L (ref 3.5–5.2)
Sodium: 137 mmol/L (ref 134–144)
Total Protein: 6.6 g/dL (ref 6.0–8.5)
eGFR: 116 mL/min/1.73

## 2024-04-14 LAB — TSH: TSH: 2.01 u[IU]/mL (ref 0.450–4.500)

## 2024-04-14 LAB — VITAMIN D 25 HYDROXY (VIT D DEFICIENCY, FRACTURES): Vit D, 25-Hydroxy: 19.2 ng/mL — ABNORMAL LOW (ref 30.0–100.0)

## 2024-04-14 LAB — HEMOGLOBIN A1C
Est. average glucose Bld gHb Est-mCnc: 117 mg/dL
Hgb A1c MFr Bld: 5.7 % — ABNORMAL HIGH (ref 4.8–5.6)

## 2024-04-14 NOTE — Progress Notes (Signed)
 Contacted via MyChart  Good morning Julia Blanchard, your labs have returned: - Lipid panel is showing elevation in LDL, bad cholesterol, for this focus is on healthy diet changes and regular exercise. - Vitamin D  level is a little low, for this I recommend you take Vitamin D3 2000 units which could help on your weight loss journey too. It is good for bone health and overall health. - A1c is in prediabetic range of 5.7 to 6.4%. Focus for this is the direction we are going, healthy diet changes and regular exercise. - Remainder of labs are nice and stable. Any questions? Keep being amazing!!  Thank you for allowing me to participate in your care.  I appreciate you. Kindest regards, Azzie Thiem

## 2024-06-24 ENCOUNTER — Ambulatory Visit: Payer: PRIVATE HEALTH INSURANCE | Admitting: Nurse Practitioner
